# Patient Record
Sex: Female | Born: 1970 | Race: Black or African American | Hispanic: No | Marital: Single | Smoking: Never smoker
Health system: Southern US, Community
[De-identification: ages and names within clinical notes are randomized; demographics above are authoritative.]

## PROBLEM LIST (undated history)

## (undated) DIAGNOSIS — IMO0001 Reserved for inherently not codable concepts without codable children: Secondary | ICD-10-CM

## (undated) DIAGNOSIS — I639 Cerebral infarction, unspecified: Secondary | ICD-10-CM

## (undated) DIAGNOSIS — I509 Heart failure, unspecified: Secondary | ICD-10-CM

## (undated) DIAGNOSIS — I10 Essential (primary) hypertension: Secondary | ICD-10-CM

## (undated) DIAGNOSIS — R569 Unspecified convulsions: Secondary | ICD-10-CM

## (undated) DIAGNOSIS — Z5189 Encounter for other specified aftercare: Secondary | ICD-10-CM

## (undated) DIAGNOSIS — I499 Cardiac arrhythmia, unspecified: Secondary | ICD-10-CM

## (undated) HISTORY — PX: TONSILLECTOMY: SUR1361

## (undated) HISTORY — DX: Unspecified convulsions: R56.9

## (undated) HISTORY — PX: ABDOMINAL HYSTERECTOMY: SHX81

## (undated) HISTORY — PX: ORTHOPEDIC SURGERY: SHX850

---

## 2008-08-28 ENCOUNTER — Ambulatory Visit: Payer: Self-pay | Admitting: Radiology

## 2008-08-28 ENCOUNTER — Emergency Department (HOSPITAL_BASED_OUTPATIENT_CLINIC_OR_DEPARTMENT_OTHER): Admission: EM | Admit: 2008-08-28 | Discharge: 2008-08-28 | Payer: Self-pay | Admitting: Emergency Medicine

## 2008-09-10 ENCOUNTER — Emergency Department (HOSPITAL_BASED_OUTPATIENT_CLINIC_OR_DEPARTMENT_OTHER): Admission: EM | Admit: 2008-09-10 | Discharge: 2008-09-10 | Payer: Self-pay | Admitting: Emergency Medicine

## 2008-09-24 ENCOUNTER — Emergency Department (HOSPITAL_BASED_OUTPATIENT_CLINIC_OR_DEPARTMENT_OTHER): Admission: EM | Admit: 2008-09-24 | Discharge: 2008-09-25 | Payer: Self-pay | Admitting: Emergency Medicine

## 2008-10-19 ENCOUNTER — Emergency Department (HOSPITAL_BASED_OUTPATIENT_CLINIC_OR_DEPARTMENT_OTHER): Admission: EM | Admit: 2008-10-19 | Discharge: 2008-10-19 | Payer: Self-pay | Admitting: Emergency Medicine

## 2008-12-20 ENCOUNTER — Emergency Department (HOSPITAL_BASED_OUTPATIENT_CLINIC_OR_DEPARTMENT_OTHER): Admission: EM | Admit: 2008-12-20 | Discharge: 2008-12-20 | Payer: Self-pay | Admitting: Emergency Medicine

## 2009-01-30 ENCOUNTER — Emergency Department (HOSPITAL_BASED_OUTPATIENT_CLINIC_OR_DEPARTMENT_OTHER): Admission: EM | Admit: 2009-01-30 | Discharge: 2009-01-30 | Payer: Self-pay | Admitting: Emergency Medicine

## 2009-01-30 ENCOUNTER — Ambulatory Visit: Payer: Self-pay | Admitting: Radiology

## 2009-04-10 ENCOUNTER — Ambulatory Visit: Payer: Self-pay | Admitting: Diagnostic Radiology

## 2009-04-10 ENCOUNTER — Emergency Department (HOSPITAL_BASED_OUTPATIENT_CLINIC_OR_DEPARTMENT_OTHER): Admission: EM | Admit: 2009-04-10 | Discharge: 2009-04-11 | Payer: Self-pay | Admitting: Emergency Medicine

## 2009-04-21 ENCOUNTER — Emergency Department (HOSPITAL_BASED_OUTPATIENT_CLINIC_OR_DEPARTMENT_OTHER): Admission: EM | Admit: 2009-04-21 | Discharge: 2009-04-21 | Payer: Self-pay | Admitting: Emergency Medicine

## 2009-06-05 ENCOUNTER — Emergency Department (HOSPITAL_BASED_OUTPATIENT_CLINIC_OR_DEPARTMENT_OTHER): Admission: EM | Admit: 2009-06-05 | Discharge: 2009-06-05 | Payer: Self-pay | Admitting: Emergency Medicine

## 2009-06-13 ENCOUNTER — Emergency Department (HOSPITAL_BASED_OUTPATIENT_CLINIC_OR_DEPARTMENT_OTHER): Admission: EM | Admit: 2009-06-13 | Discharge: 2009-06-13 | Payer: Self-pay | Admitting: Emergency Medicine

## 2009-06-13 ENCOUNTER — Ambulatory Visit: Payer: Self-pay | Admitting: Diagnostic Radiology

## 2009-09-18 ENCOUNTER — Emergency Department (HOSPITAL_BASED_OUTPATIENT_CLINIC_OR_DEPARTMENT_OTHER): Admission: EM | Admit: 2009-09-18 | Discharge: 2009-09-18 | Payer: Self-pay | Admitting: Emergency Medicine

## 2009-09-18 ENCOUNTER — Ambulatory Visit: Payer: Self-pay | Admitting: Diagnostic Radiology

## 2009-10-19 ENCOUNTER — Emergency Department (HOSPITAL_BASED_OUTPATIENT_CLINIC_OR_DEPARTMENT_OTHER): Admission: EM | Admit: 2009-10-19 | Discharge: 2009-10-19 | Payer: Self-pay | Admitting: Emergency Medicine

## 2010-01-04 ENCOUNTER — Emergency Department: Payer: Self-pay | Admitting: Emergency Medicine

## 2010-01-16 ENCOUNTER — Emergency Department (HOSPITAL_BASED_OUTPATIENT_CLINIC_OR_DEPARTMENT_OTHER): Admission: EM | Admit: 2010-01-16 | Discharge: 2010-01-16 | Payer: Self-pay | Admitting: Emergency Medicine

## 2010-04-06 ENCOUNTER — Emergency Department (HOSPITAL_BASED_OUTPATIENT_CLINIC_OR_DEPARTMENT_OTHER)
Admission: EM | Admit: 2010-04-06 | Discharge: 2010-04-06 | Disposition: A | Discharge status: Other Acute Inpatient Hospital | Payer: Self-pay | Source: Home / Self Care | Admitting: Emergency Medicine

## 2010-04-13 LAB — DIFFERENTIAL
Basophils Absolute: 0 10*3/uL (ref 0.0–0.1)
Basophils Relative: 0 % (ref 0–1)
Eosinophils Absolute: 0.2 10*3/uL (ref 0.0–0.7)
Eosinophils Relative: 3 % (ref 0–5)
Lymphocytes Relative: 43 % (ref 12–46)
Lymphs Abs: 2.4 10*3/uL (ref 0.7–4.0)
Monocytes Absolute: 0.5 10*3/uL (ref 0.1–1.0)
Monocytes Relative: 9 % (ref 3–12)
Neutro Abs: 2.5 10*3/uL (ref 1.7–7.7)
Neutrophils Relative %: 44 % (ref 43–77)

## 2010-04-13 LAB — CBC
HCT: 34.9 % — ABNORMAL LOW (ref 36.0–46.0)
Hemoglobin: 12 g/dL (ref 12.0–15.0)
MCH: 30.7 pg (ref 26.0–34.0)
MCHC: 34.4 g/dL (ref 30.0–36.0)
MCV: 89.3 fL (ref 78.0–100.0)
Platelets: 285 10*3/uL (ref 150–400)
RBC: 3.91 MIL/uL (ref 3.87–5.11)
RDW: 13.3 % (ref 11.5–15.5)
WBC: 5.5 10*3/uL (ref 4.0–10.5)

## 2010-04-13 LAB — BASIC METABOLIC PANEL
BUN: 11 mg/dL (ref 6–23)
CO2: 24 mEq/L (ref 19–32)
Calcium: 8.8 mg/dL (ref 8.4–10.5)
Chloride: 108 mEq/L (ref 96–112)
Creatinine, Ser: 0.9 mg/dL (ref 0.4–1.2)
GFR calc Af Amer: >60 mL/min (ref >60)
GFR calc non Af Amer: >60 mL/min (ref >60)
Glucose, Bld: 80 mg/dL (ref 70–99)
Potassium: 4 mEq/L (ref 3.5–5.1)
Sodium: 142 mEq/L (ref 135–145)

## 2010-04-13 LAB — URINALYSIS, ROUTINE W REFLEX MICROSCOPIC
Bilirubin Urine: NEGATIVE
Hgb urine dipstick: NEGATIVE
Ketones, ur: NEGATIVE mg/dL
Nitrite: NEGATIVE
Protein, ur: NEGATIVE mg/dL
Specific Gravity, Urine: 1.022 (ref 1.005–1.030)
Urine Glucose, Fasting: NEGATIVE mg/dL
Urobilinogen, UA: 1 mg/dL (ref 0.0–1.0)
pH: 5.5 (ref 5.0–8.0)

## 2010-04-13 LAB — POCT CARDIAC MARKERS
CKMB, poc: <1 ng/mL — ABNORMAL LOW (ref 1.0–8.0)
Myoglobin, poc: 57.9 ng/mL (ref 12–200)
Troponin i, poc: <0.05 ng/mL (ref 0.00–0.09)

## 2010-04-13 LAB — PREGNANCY, URINE: Preg Test, Ur: NEGATIVE

## 2010-05-05 ENCOUNTER — Emergency Department (INDEPENDENT_AMBULATORY_CARE_PROVIDER_SITE_OTHER): Payer: Medicaid Other

## 2010-05-05 ENCOUNTER — Emergency Department (HOSPITAL_BASED_OUTPATIENT_CLINIC_OR_DEPARTMENT_OTHER)
Admission: EM | Admit: 2010-05-05 | Discharge: 2010-05-05 | Disposition: A | Discharge status: Home / Self Care | Payer: Medicaid Other | Attending: Emergency Medicine | Admitting: Emergency Medicine

## 2010-05-05 DIAGNOSIS — Y92009 Unspecified place in unspecified non-institutional (private) residence as the place of occurrence of the external cause: Secondary | ICD-10-CM | POA: Insufficient documentation

## 2010-05-05 DIAGNOSIS — J45909 Unspecified asthma, uncomplicated: Secondary | ICD-10-CM | POA: Insufficient documentation

## 2010-05-05 DIAGNOSIS — M25579 Pain in unspecified ankle and joints of unspecified foot: Secondary | ICD-10-CM | POA: Insufficient documentation

## 2010-05-05 DIAGNOSIS — Y93E1 Activity, personal bathing and showering: Secondary | ICD-10-CM

## 2010-05-05 DIAGNOSIS — W010XXA Fall on same level from slipping, tripping and stumbling without subsequent striking against object, initial encounter: Secondary | ICD-10-CM

## 2010-05-05 DIAGNOSIS — R61 Generalized hyperhidrosis: Secondary | ICD-10-CM | POA: Insufficient documentation

## 2010-05-05 DIAGNOSIS — S63509A Unspecified sprain of unspecified wrist, initial encounter: Secondary | ICD-10-CM | POA: Insufficient documentation

## 2010-05-05 DIAGNOSIS — S93409A Sprain of unspecified ligament of unspecified ankle, initial encounter: Secondary | ICD-10-CM | POA: Insufficient documentation

## 2010-05-05 DIAGNOSIS — M549 Dorsalgia, unspecified: Secondary | ICD-10-CM

## 2010-05-05 DIAGNOSIS — M25539 Pain in unspecified wrist: Secondary | ICD-10-CM

## 2010-05-05 DIAGNOSIS — I10 Essential (primary) hypertension: Secondary | ICD-10-CM | POA: Insufficient documentation

## 2010-06-22 LAB — URINE MICROSCOPIC-ADD ON

## 2010-06-22 LAB — URINALYSIS, ROUTINE W REFLEX MICROSCOPIC
Bilirubin Urine: NEGATIVE
Glucose, UA: NEGATIVE mg/dL
Hgb urine dipstick: NEGATIVE
Ketones, ur: NEGATIVE mg/dL
Nitrite: NEGATIVE
Protein, ur: NEGATIVE mg/dL
Specific Gravity, Urine: 1.02 (ref 1.005–1.030)
Urobilinogen, UA: 1 mg/dL (ref 0.0–1.0)
pH: 7 (ref 5.0–8.0)

## 2010-06-30 ENCOUNTER — Emergency Department (INDEPENDENT_AMBULATORY_CARE_PROVIDER_SITE_OTHER): Payer: Medicaid Other

## 2010-06-30 ENCOUNTER — Emergency Department (HOSPITAL_BASED_OUTPATIENT_CLINIC_OR_DEPARTMENT_OTHER)
Admission: EM | Admit: 2010-06-30 | Discharge: 2010-06-30 | Disposition: A | Discharge status: Home / Self Care | Payer: Medicaid Other | Attending: Emergency Medicine | Admitting: Emergency Medicine

## 2010-06-30 DIAGNOSIS — I10 Essential (primary) hypertension: Secondary | ICD-10-CM | POA: Insufficient documentation

## 2010-06-30 DIAGNOSIS — R071 Chest pain on breathing: Secondary | ICD-10-CM | POA: Insufficient documentation

## 2010-06-30 DIAGNOSIS — Z79899 Other long term (current) drug therapy: Secondary | ICD-10-CM | POA: Insufficient documentation

## 2010-06-30 DIAGNOSIS — R079 Chest pain, unspecified: Secondary | ICD-10-CM | POA: Insufficient documentation

## 2010-06-30 DIAGNOSIS — R0602 Shortness of breath: Secondary | ICD-10-CM

## 2010-06-30 DIAGNOSIS — J45909 Unspecified asthma, uncomplicated: Secondary | ICD-10-CM | POA: Insufficient documentation

## 2010-06-30 DIAGNOSIS — G43909 Migraine, unspecified, not intractable, without status migrainosus: Secondary | ICD-10-CM | POA: Insufficient documentation

## 2010-06-30 DIAGNOSIS — R55 Syncope and collapse: Secondary | ICD-10-CM | POA: Insufficient documentation

## 2010-06-30 LAB — DIFFERENTIAL
Basophils Absolute: 0 10*3/uL (ref 0.0–0.1)
Basophils Relative: 0 % (ref 0–1)
Eosinophils Absolute: 0.1 10*3/uL (ref 0.0–0.7)
Eosinophils Relative: 3 % (ref 0–5)
Lymphocytes Relative: 39 % (ref 12–46)
Lymphs Abs: 1.9 10*3/uL (ref 0.7–4.0)
Monocytes Absolute: 0.3 10*3/uL (ref 0.1–1.0)
Monocytes Relative: 6 % (ref 3–12)
Neutro Abs: 2.5 10*3/uL (ref 1.7–7.7)
Neutrophils Relative %: 52 % (ref 43–77)

## 2010-06-30 LAB — URINALYSIS, ROUTINE W REFLEX MICROSCOPIC
Bilirubin Urine: NEGATIVE
Glucose, UA: NEGATIVE mg/dL
Hgb urine dipstick: NEGATIVE
Ketones, ur: NEGATIVE mg/dL
Nitrite: NEGATIVE
Protein, ur: NEGATIVE mg/dL
Specific Gravity, Urine: 1.021 (ref 1.005–1.030)
Urobilinogen, UA: 1 mg/dL (ref 0.0–1.0)
pH: 6 (ref 5.0–8.0)

## 2010-06-30 LAB — CBC
HCT: 36.3 % (ref 36.0–46.0)
Hemoglobin: 12.5 g/dL (ref 12.0–15.0)
MCH: 31.5 pg (ref 26.0–34.0)
MCHC: 34.4 g/dL (ref 30.0–36.0)
MCV: 91.4 fL (ref 78.0–100.0)
Platelets: 308 10*3/uL (ref 150–400)
RBC: 3.97 MIL/uL (ref 3.87–5.11)
RDW: 14.1 % (ref 11.5–15.5)
WBC: 4.9 10*3/uL (ref 4.0–10.5)

## 2010-06-30 LAB — BASIC METABOLIC PANEL
BUN: 14 mg/dL (ref 6–23)
CO2: 23 mEq/L (ref 19–32)
Calcium: 8.8 mg/dL (ref 8.4–10.5)
Chloride: 109 mEq/L (ref 96–112)
Creatinine, Ser: 0.8 mg/dL (ref 0.4–1.2)
GFR calc Af Amer: >60 mL/min (ref >60)
GFR calc non Af Amer: >60 mL/min (ref >60)
Glucose, Bld: 113 mg/dL — ABNORMAL HIGH (ref 70–99)
Potassium: 4.1 mEq/L (ref 3.5–5.1)
Sodium: 145 mEq/L (ref 135–145)

## 2010-06-30 LAB — POCT CARDIAC MARKERS
CKMB, poc: <1 ng/mL — ABNORMAL LOW (ref 1.0–8.0)
Myoglobin, poc: 39.1 ng/mL (ref 12–200)
Troponin i, poc: <0.05 ng/mL (ref 0.00–0.09)

## 2010-06-30 LAB — URINE MICROSCOPIC-ADD ON

## 2010-07-06 ENCOUNTER — Emergency Department (HOSPITAL_BASED_OUTPATIENT_CLINIC_OR_DEPARTMENT_OTHER)
Admission: EM | Admit: 2010-07-06 | Discharge: 2010-07-06 | Disposition: A | Discharge status: Home / Self Care | Payer: Medicaid Other | Attending: Emergency Medicine | Admitting: Emergency Medicine

## 2010-07-06 DIAGNOSIS — M549 Dorsalgia, unspecified: Secondary | ICD-10-CM | POA: Insufficient documentation

## 2010-07-06 DIAGNOSIS — J45909 Unspecified asthma, uncomplicated: Secondary | ICD-10-CM | POA: Insufficient documentation

## 2010-07-06 DIAGNOSIS — I10 Essential (primary) hypertension: Secondary | ICD-10-CM | POA: Insufficient documentation

## 2010-07-06 LAB — URINALYSIS, ROUTINE W REFLEX MICROSCOPIC
Glucose, UA: NEGATIVE mg/dL
Ketones, ur: NEGATIVE mg/dL
Protein, ur: NEGATIVE mg/dL

## 2010-07-06 LAB — URINE MICROSCOPIC-ADD ON

## 2010-07-29 ENCOUNTER — Emergency Department (INDEPENDENT_AMBULATORY_CARE_PROVIDER_SITE_OTHER): Payer: Medicaid Other

## 2010-07-29 ENCOUNTER — Emergency Department (HOSPITAL_BASED_OUTPATIENT_CLINIC_OR_DEPARTMENT_OTHER)
Admission: EM | Admit: 2010-07-29 | Discharge: 2010-07-29 | Disposition: A | Discharge status: Home / Self Care | Payer: Medicaid Other | Attending: Emergency Medicine | Admitting: Emergency Medicine

## 2010-07-29 DIAGNOSIS — J45909 Unspecified asthma, uncomplicated: Secondary | ICD-10-CM | POA: Insufficient documentation

## 2010-07-29 DIAGNOSIS — R51 Headache: Secondary | ICD-10-CM | POA: Insufficient documentation

## 2010-07-29 DIAGNOSIS — I10 Essential (primary) hypertension: Secondary | ICD-10-CM | POA: Insufficient documentation

## 2010-07-29 DIAGNOSIS — M549 Dorsalgia, unspecified: Secondary | ICD-10-CM

## 2010-07-29 LAB — COMPREHENSIVE METABOLIC PANEL
ALT: 17 U/L (ref 0–35)
Alkaline Phosphatase: 72 U/L (ref 39–117)
CO2: 22 mEq/L (ref 19–32)
Chloride: 105 mEq/L (ref 96–112)
GFR calc non Af Amer: >60 mL/min (ref >60)
Glucose, Bld: 94 mg/dL (ref 70–99)
Potassium: 3.7 mEq/L (ref 3.5–5.1)
Sodium: 137 mEq/L (ref 135–145)

## 2010-07-29 LAB — DIFFERENTIAL
Basophils Absolute: 0 10*3/uL (ref 0.0–0.1)
Lymphocytes Relative: 27 % (ref 12–46)
Monocytes Absolute: 0.5 10*3/uL (ref 0.1–1.0)
Neutro Abs: 2.2 10*3/uL (ref 1.7–7.7)

## 2010-07-29 LAB — CBC
HCT: 33.4 % — ABNORMAL LOW (ref 36.0–46.0)
Hemoglobin: 11.4 g/dL — ABNORMAL LOW (ref 12.0–15.0)
MCHC: 34.1 g/dL (ref 30.0–36.0)
WBC: 3.8 10*3/uL — ABNORMAL LOW (ref 4.0–10.5)

## 2010-07-29 LAB — URINALYSIS, ROUTINE W REFLEX MICROSCOPIC
Ketones, ur: NEGATIVE mg/dL
Nitrite: NEGATIVE
Protein, ur: NEGATIVE mg/dL

## 2010-08-31 ENCOUNTER — Emergency Department (HOSPITAL_BASED_OUTPATIENT_CLINIC_OR_DEPARTMENT_OTHER)
Admission: EM | Admit: 2010-08-31 | Discharge: 2010-08-31 | Disposition: A | Discharge status: Home / Self Care | Payer: Medicaid Other | Attending: Emergency Medicine | Admitting: Emergency Medicine

## 2010-08-31 DIAGNOSIS — J45909 Unspecified asthma, uncomplicated: Secondary | ICD-10-CM | POA: Insufficient documentation

## 2010-08-31 DIAGNOSIS — I10 Essential (primary) hypertension: Secondary | ICD-10-CM | POA: Insufficient documentation

## 2010-08-31 DIAGNOSIS — R002 Palpitations: Secondary | ICD-10-CM | POA: Insufficient documentation

## 2010-08-31 DIAGNOSIS — G43909 Migraine, unspecified, not intractable, without status migrainosus: Secondary | ICD-10-CM | POA: Insufficient documentation

## 2010-09-23 ENCOUNTER — Emergency Department (HOSPITAL_BASED_OUTPATIENT_CLINIC_OR_DEPARTMENT_OTHER)
Admission: EM | Admit: 2010-09-23 | Discharge: 2010-09-23 | Disposition: A | Discharge status: Home / Self Care | Payer: Medicaid Other | Attending: Emergency Medicine | Admitting: Emergency Medicine

## 2010-09-23 DIAGNOSIS — I10 Essential (primary) hypertension: Secondary | ICD-10-CM | POA: Insufficient documentation

## 2010-09-23 DIAGNOSIS — R51 Headache: Secondary | ICD-10-CM | POA: Insufficient documentation

## 2010-09-23 DIAGNOSIS — J45909 Unspecified asthma, uncomplicated: Secondary | ICD-10-CM | POA: Insufficient documentation

## 2010-12-13 ENCOUNTER — Encounter: Payer: Self-pay | Admitting: *Deleted

## 2010-12-13 ENCOUNTER — Emergency Department (HOSPITAL_BASED_OUTPATIENT_CLINIC_OR_DEPARTMENT_OTHER)
Admission: EM | Admit: 2010-12-13 | Discharge: 2010-12-14 | Disposition: A | Discharge status: Home / Self Care | Payer: Medicaid Other | Attending: Emergency Medicine | Admitting: Emergency Medicine

## 2010-12-13 ENCOUNTER — Other Ambulatory Visit: Payer: Self-pay

## 2010-12-13 DIAGNOSIS — R55 Syncope and collapse: Secondary | ICD-10-CM | POA: Insufficient documentation

## 2010-12-13 DIAGNOSIS — R112 Nausea with vomiting, unspecified: Secondary | ICD-10-CM | POA: Insufficient documentation

## 2010-12-13 DIAGNOSIS — R51 Headache: Secondary | ICD-10-CM

## 2010-12-13 DIAGNOSIS — J45909 Unspecified asthma, uncomplicated: Secondary | ICD-10-CM | POA: Insufficient documentation

## 2010-12-13 DIAGNOSIS — I10 Essential (primary) hypertension: Secondary | ICD-10-CM | POA: Insufficient documentation

## 2010-12-13 DIAGNOSIS — R079 Chest pain, unspecified: Secondary | ICD-10-CM

## 2010-12-13 DIAGNOSIS — R002 Palpitations: Secondary | ICD-10-CM | POA: Insufficient documentation

## 2010-12-13 DIAGNOSIS — I509 Heart failure, unspecified: Secondary | ICD-10-CM | POA: Insufficient documentation

## 2010-12-13 DIAGNOSIS — R202 Paresthesia of skin: Secondary | ICD-10-CM

## 2010-12-13 HISTORY — DX: Reserved for inherently not codable concepts without codable children: IMO0001

## 2010-12-13 HISTORY — DX: Essential (primary) hypertension: I10

## 2010-12-13 HISTORY — DX: Heart failure, unspecified: I50.9

## 2010-12-13 HISTORY — DX: Encounter for other specified aftercare: Z51.89

## 2010-12-13 HISTORY — DX: Cardiac arrhythmia, unspecified: I49.9

## 2010-12-13 NOTE — ED Notes (Signed)
Pt reports intermittant chest pain- right side x 2 hours, shooting down right arm- states "i was seeing 2 of everything" and "i may have had a seizure- i know i passed out"- states "vision was funny"- pt wearing heart monitor x 3 weeks due to irregular heart beat

## 2010-12-13 NOTE — ED Notes (Signed)
EDP Otter at bedside to assess

## 2010-12-13 NOTE — ED Notes (Signed)
Pt reports she has been wearing heart monitor x 3 weeks for arrhythmia- states tonight found herself on floor in bathroom- states her vision got funny "i was seeing double" and she was "feeling funny"- pt reported right side chest pain radiating into right arm- nauseated at this time- ekg done on arrival and given to EDP Otter to review

## 2010-12-14 ENCOUNTER — Emergency Department (INDEPENDENT_AMBULATORY_CARE_PROVIDER_SITE_OTHER): Payer: Medicaid Other

## 2010-12-14 ENCOUNTER — Encounter (HOSPITAL_BASED_OUTPATIENT_CLINIC_OR_DEPARTMENT_OTHER): Payer: Self-pay | Admitting: *Deleted

## 2010-12-14 DIAGNOSIS — R42 Dizziness and giddiness: Secondary | ICD-10-CM

## 2010-12-14 DIAGNOSIS — R55 Syncope and collapse: Secondary | ICD-10-CM

## 2010-12-14 DIAGNOSIS — R079 Chest pain, unspecified: Secondary | ICD-10-CM

## 2010-12-14 LAB — CARDIAC PANEL(CRET KIN+CKTOT+MB+TROPI)
CK, MB: 3.4 ng/mL (ref 0.3–4.0)
CK, MB: 3.3 ng/mL (ref 0.3–4.0)
Total CK: 288 U/L — ABNORMAL HIGH (ref 7–177)
Total CK: 261 U/L — ABNORMAL HIGH (ref 7–177)
Troponin I: <0.3 ng/mL (ref <0.30)

## 2010-12-14 LAB — COMPREHENSIVE METABOLIC PANEL
Alkaline Phosphatase: 77 U/L (ref 39–117)
BUN: 10 mg/dL (ref 6–23)
CO2: 26 mEq/L (ref 19–32)
GFR calc Af Amer: >60 mL/min (ref >60)
GFR calc non Af Amer: >60 mL/min (ref >60)
Glucose, Bld: 86 mg/dL (ref 70–99)
Potassium: 3.6 mEq/L (ref 3.5–5.1)
Total Protein: 7.6 g/dL (ref 6.0–8.3)

## 2010-12-14 LAB — MAGNESIUM: Magnesium: 2.1 mg/dL (ref 1.5–2.5)

## 2010-12-14 LAB — CBC
HCT: 36.9 % (ref 36.0–46.0)
MCH: 31.3 pg (ref 26.0–34.0)
MCHC: 34.1 g/dL (ref 30.0–36.0)
RDW: 13 % (ref 11.5–15.5)

## 2010-12-14 MED ORDER — ASPIRIN 81 MG PO CHEW
324.0000 mg | CHEWABLE_TABLET | Freq: Once | ORAL | Status: AC
  Administered 2010-12-14: 324 mg via ORAL
  Filled 2010-12-14: qty 4

## 2010-12-14 MED ORDER — DIPHENHYDRAMINE HCL 50 MG/ML IJ SOLN
25.0000 mg | Freq: Once | INTRAMUSCULAR | Status: AC
  Administered 2010-12-14: 25 mg via INTRAVENOUS
  Filled 2010-12-14: qty 1

## 2010-12-14 MED ORDER — ONDANSETRON HCL 4 MG/2ML IJ SOLN
4.0000 mg | Freq: Once | INTRAMUSCULAR | Status: AC
  Administered 2010-12-14: 4 mg via INTRAVENOUS
  Filled 2010-12-14: qty 2

## 2010-12-14 MED ORDER — SODIUM CHLORIDE 0.9 % IJ SOLN
3.0000 mL | Freq: Two times a day (BID) | INTRAMUSCULAR | Status: DC
  Filled 2010-12-14: qty 3

## 2010-12-14 MED ORDER — MORPHINE SULFATE 4 MG/ML IJ SOLN
4.0000 mg | Freq: Once | INTRAMUSCULAR | Status: AC
  Administered 2010-12-14: 4 mg via INTRAVENOUS
  Filled 2010-12-14: qty 1

## 2010-12-14 MED ORDER — ONDANSETRON HCL 4 MG PO TABS: 4.0000 mg | ORAL_TABLET | Freq: Three times a day (TID) | ORAL | Status: AC | PRN

## 2010-12-14 MED ORDER — METOCLOPRAMIDE HCL 5 MG/ML IJ SOLN
10.0000 mg | Freq: Once | INTRAMUSCULAR | Status: AC
  Administered 2010-12-14: 10 mg via INTRAVENOUS
  Filled 2010-12-14: qty 2

## 2010-12-14 MED ORDER — SODIUM CHLORIDE 0.9 % IV SOLN
250.0000 mL | INTRAVENOUS | Status: DC
  Administered 2010-12-14: 250 mL via INTRAVENOUS

## 2010-12-14 MED ORDER — SODIUM CHLORIDE 0.9 % IJ SOLN
3.0000 mL | INTRAMUSCULAR | Status: DC | PRN
  Filled 2010-12-14: qty 3

## 2010-12-14 NOTE — ED Notes (Signed)
Report to Delorise Jackson, RN

## 2010-12-14 NOTE — ED Provider Notes (Addendum)
History     CSN: [id number] Arrival date & time: 12/13/2010 11:23 PM   Chief Complaint  Patient presents with  . Chest Pain    HPI 40 year old female presents to emergency department with multiple complaints. Patient reports she was in good health today but then started having pain in her right chest going down into her right arm with tingling and "funny feeling" patient with headache as well with nausea and vomiting upon arrival here. Patient reports she's been having palpitations. Patient said that when symptoms started she was lying in the bed and her vision went blurry then double and triple vision. She got up to the bathroom and thinks she may have passed out she is unsure. Patient reports similar symptoms in the past and has been admitted to [facility]. Patient is currently on Holter monitor for irregular heartbeats. Patient reports that she doesn't like to go to [facility] ER due to "their bad attitude"   Past Medical History  Diagnosis Date  . Asthma   . Blood transfusion   . CHF (congestive heart failure)   . Hypertension   . Cardiac arrhythmia   . Migraine      Past Surgical History  Procedure Date  . Abdominal hysterectomy   . Tonsillectomy     History reviewed. No pertinent family history.  History  Substance Use Topics  . Smoking status: Never Smoker   . Smokeless tobacco: Not on file  . Alcohol Use: No    OB History    Grav Para Term Preterm Abortions TAB SAB Ect Mult Living                  Review of Systems  All other systems reviewed and are negative.    Allergies  Review of patient's allergies indicates no known allergies.  Home Medications   Current Outpatient Rx  Name Route Sig Dispense Refill  . TOPAMAX PO Oral Take by mouth.      . ONDANSETRON HCL 4 MG PO TABS Oral Take 1 tablet (4 mg total) by mouth every 8 (eight) hours as needed for nausea. 12 tablet 0    Physical Exam    BP 118/67  Pulse 73  Temp(Src) 97.9  F (36.6 C) (Oral)  Resp 16  SpO2 98%  Physical Exam  Constitutional: She is oriented to person, place, and time. She appears well-developed and well-nourished. She appears distressed.  HENT:  Head: Normocephalic and atraumatic.  Right Ear: External ear normal.  Left Ear: External ear normal.  Eyes: Conjunctivae and EOM are normal. Pupils are equal, round, and reactive to light. Right eye exhibits no discharge. Left eye exhibits no discharge. No scleral icterus.  Neck: Normal range of motion. Neck supple. No JVD present. No tracheal deviation present. No thyromegaly present.  Cardiovascular: Normal rate, regular rhythm, normal heart sounds and intact distal pulses.  Exam reveals no gallop and no friction rub.   No murmur heard. Pulmonary/Chest: Effort normal and breath sounds normal. No stridor. No respiratory distress. She has no wheezes. She has no rales.  Abdominal: Soft. Bowel sounds are normal. She exhibits no distension and no mass. There is no tenderness. There is no rebound and no guarding.  Musculoskeletal: Normal range of motion. She exhibits no edema and no tenderness.  Lymphadenopathy:    She has no cervical adenopathy.  Neurological: She is alert and oriented to person, place, and time. She has normal reflexes. She displays normal reflexes. No cranial nerve deficit.  She exhibits normal muscle tone. Coordination normal.       Pt reports pain in right arm, paresthesias  Skin: Skin is warm and dry. No rash noted. No erythema. No pallor.    ED Course  Procedures  Results for orders placed during the hospital encounter of 12/13/10  CBC      Component Value Range   WBC 6.0  4.0 - 10.5 (K/uL)   RBC 4.03  3.87 - 5.11 (MIL/uL)   Hemoglobin 12.6  12.0 - 15.0 (g/dL)   HCT 62.9  52.8 - 41.3 (%)   MCV 91.6  78.0 - 100.0 (fL)   MCH 31.3  26.0 - 34.0 (pg)   MCHC 34.1  30.0 - 36.0 (g/dL)   RDW 24.4  01.0 - 27.2 (%)   Platelets 323  150 - 400 (K/uL)  CARDIAC PANEL(CRET  KIN+CKTOT+MB+TROPI)      Component Value Range   Total CK 288 (*) 7 - 177 (U/L)   CK, MB 3.4  0.3 - 4.0 (ng/mL)   Troponin I <0.30  <0.30 (ng/mL)   Relative Index 1.2  0.0 - 2.5   COMPREHENSIVE METABOLIC PANEL      Component Value Range   Sodium 140  135 - 145 (mEq/L)   Potassium 3.6  3.5 - 5.1 (mEq/L)   Chloride 103  96 - 112 (mEq/L)   CO2 26  19 - 32 (mEq/L)   Glucose, Bld 86  70 - 99 (mg/dL)   BUN 10  6 - 23 (mg/dL)   Creatinine, Ser 5.36  0.50 - 1.10 (mg/dL)   Calcium 9.6  8.4 - 64.4 (mg/dL)   Total Protein 7.6  6.0 - 8.3 (g/dL)   Albumin 4.1  3.5 - 5.2 (g/dL)   AST 18  0 - 37 (U/L)   ALT 17  0 - 35 (U/L)   Alkaline Phosphatase 77  39 - 117 (U/L)   Total Bilirubin 0.7  0.3 - 1.2 (mg/dL)   GFR calc non Af Amer >60  >60 (mL/min)   GFR calc Af Amer >60  >60 (mL/min)  MAGNESIUM      Component Value Range   Magnesium 2.1  1.5 - 2.5 (mg/dL)  PROTIME-INR      Component Value Range   Prothrombin Time 13.1  11.6 - 15.2 (seconds)   INR 0.97  0.00 - 1.49   PRO B NATRIURETIC PEPTIDE      Component Value Range   BNP, POC 41.5  0 - 125 (pg/mL)  CARDIAC PANEL(CRET KIN+CKTOT+MB+TROPI)      Component Value Range   Total CK 261 (*) 7 - 177 (U/L)   CK, MB 3.3  0.3 - 4.0 (ng/mL)   Troponin I <0.30  <0.30 (ng/mL)   Relative Index 1.3  0.0 - 2.5    Ct Head Wo Contrast  12/14/2010  *RADIOLOGY REPORT*  Clinical Data: 40 year old female with syncope and dizziness.  CT HEAD WITHOUT CONTRAST  Technique:  Contiguous axial images were obtained from the base of the skull through the vertex without contrast.  Comparison: 06/13/2009  Findings: No intracranial abnormalities are identified, including mass lesion or mass effect, hydrocephalus, extra-axial fluid collection, midline shift, hemorrhage, or acute infarction.  The visualized bony calvarium is unremarkable.  IMPRESSION: Unremarkable noncontrast head CT  Original Report Authenticated By: Rosendo Gros, M.D.   Dg Chest Portable 1  View  12/14/2010  *RADIOLOGY REPORT*  Clinical Data: Chest pain and syncope.  PORTABLE CHEST - 1 VIEW  Comparison: 07/29/2010  Findings: The cardiomediastinal silhouette is unremarkable. The lungs are clear. There is no evidence of focal airspace disease, pulmonary edema, pulmonary nodule/mass, pleural effusion, or pneumothorax. No acute bony abnormalities are identified.  IMPRESSION: No evidence of active cardiopulmonary disease.  Original Report Authenticated By: Rosendo Gros, M.D.    Date: 12/14/2010  Rate: 76  Rhythm: normal sinus rhythm  QRS Axis: normal  Intervals: normal  ST/T Wave abnormalities: normal and nonspecific T wave changes  Conduction Disutrbances:none  Narrative Interpretation: LVH  Old EKG Reviewed: unchanged     1. Headache   2. Paresthesia of right arm   3. Chest pain, unspecified   4. Palpitation   5. Syncope and collapse      MDM 40 year old female with multiple complaints. Differential included MI, stroke, complex migraine, musculoskeletal pain, vertebral artery dissection, subarachnoid bleed. EKG unchanged from prior in June. Patient did have episode of tachycardia, however this was during an episode of vomiting. No further vomiting in the department. Patient noted to be asking for narcotics for pain control and was given morphine for same. Prior records reviewed. Patient with multiple visits to the emergency department for headache, nausea vomiting, syncope, chest pain records received from [facility] where she had an ED visit on 913 with similar complaints treated for migraine. CT scan of head without acute injury or abnormality. Chest x-ray without signs of congestive heart failure, BNP normal no further syncope or seizure activity here patient monitored for approximately 4 hours without irregular heartbeat or further chest pain. Patient currently wearing a Holter monitor and has followup with cardiologist. 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 Patient reports she lost her neurology followup due to missing several appointments. Recommended that she followup with another local neurologist for treatment of her headaches.       Olivia Mackie, MD 12/14/10 8119  Olivia Mackie, MD 01/28/11 [phone]

## 2011-01-22 ENCOUNTER — Emergency Department (HOSPITAL_BASED_OUTPATIENT_CLINIC_OR_DEPARTMENT_OTHER)
Admission: EM | Admit: 2011-01-22 | Discharge: 2011-01-22 | Disposition: A | Discharge status: Home / Self Care | Payer: Medicaid Other | Attending: Emergency Medicine | Admitting: Emergency Medicine

## 2011-01-22 DIAGNOSIS — I509 Heart failure, unspecified: Secondary | ICD-10-CM | POA: Insufficient documentation

## 2011-01-22 DIAGNOSIS — M25579 Pain in unspecified ankle and joints of unspecified foot: Secondary | ICD-10-CM | POA: Insufficient documentation

## 2011-01-22 DIAGNOSIS — J45909 Unspecified asthma, uncomplicated: Secondary | ICD-10-CM | POA: Insufficient documentation

## 2011-01-22 DIAGNOSIS — L089 Local infection of the skin and subcutaneous tissue, unspecified: Secondary | ICD-10-CM | POA: Insufficient documentation

## 2011-01-22 DIAGNOSIS — I10 Essential (primary) hypertension: Secondary | ICD-10-CM | POA: Insufficient documentation

## 2011-01-22 MED ORDER — CEPHALEXIN 250 MG PO CAPS
500.0000 mg | ORAL_CAPSULE | Freq: Once | ORAL | Status: AC
  Administered 2011-01-22: 500 mg via ORAL
  Filled 2011-01-22: qty 2

## 2011-01-22 MED ORDER — DOXYCYCLINE HYCLATE 100 MG PO TABS
100.0000 mg | ORAL_TABLET | Freq: Once | ORAL | Status: AC
  Administered 2011-01-22: 100 mg via ORAL
  Filled 2011-01-22: qty 1

## 2011-01-22 MED ORDER — OXYCODONE-ACETAMINOPHEN 5-325 MG PO TABS
2.0000 | ORAL_TABLET | Freq: Once | ORAL | Status: AC
  Administered 2011-01-22: 2 via ORAL
  Filled 2011-01-22: qty 2

## 2011-01-22 MED ORDER — DOXYCYCLINE HYCLATE 100 MG PO CAPS: 100.0000 mg | ORAL_CAPSULE | Freq: Two times a day (BID) | ORAL | Status: AC

## 2011-01-22 MED ORDER — OXYCODONE-ACETAMINOPHEN 5-325 MG PO TABS: 2.0000 | ORAL_TABLET | ORAL | Status: AC | PRN

## 2011-01-22 MED ORDER — ALBUTEROL SULFATE HFA 108 (90 BASE) MCG/ACT IN AERS: 1.0000 | INHALATION_SPRAY | Freq: Four times a day (QID) | RESPIRATORY_TRACT | Status: DC | PRN

## 2011-01-22 MED ORDER — CEPHALEXIN 500 MG PO CAPS: 500.0000 mg | ORAL_CAPSULE | Freq: Four times a day (QID) | ORAL | Status: AC
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
## 2011-01-23 NOTE — ED Provider Notes (Signed)
Medical screening examination/treatment/procedure(s) were performed by non-physician practitioner and as supervising physician I was immediately available for consultation/collaboration.   Nelia Shi, MD 01/23/11 0100

## 2011-01-26 DIAGNOSIS — L02619 Cutaneous abscess of unspecified foot: Secondary | ICD-10-CM | POA: Insufficient documentation

## 2011-01-26 DIAGNOSIS — T300 Burn of unspecified body region, unspecified degree: Secondary | ICD-10-CM | POA: Insufficient documentation

## 2011-04-28 DIAGNOSIS — J45901 Unspecified asthma with (acute) exacerbation: Secondary | ICD-10-CM | POA: Insufficient documentation

## 2011-04-28 DIAGNOSIS — J45909 Unspecified asthma, uncomplicated: Secondary | ICD-10-CM | POA: Insufficient documentation

## 2011-04-28 DIAGNOSIS — R519 Headache, unspecified: Secondary | ICD-10-CM | POA: Insufficient documentation

## 2011-04-28 DIAGNOSIS — G4700 Insomnia, unspecified: Secondary | ICD-10-CM | POA: Insufficient documentation

## 2011-11-05 IMAGING — CT CT HEAD W/O CM
1 series · 16 of 30 positions shown, 20 images · non-contrast
Comparison: 06/13/2009

CLINICAL DATA: 40-year-old female with syncope and dizziness.

CT HEAD WITHOUT CONTRAST
TECHNIQUE: Contiguous axial images were obtained from the base of
the skull through the vertex without contrast.

[Series 2: head 4.8 h37s · axial · 0.45mm/px · z∈[-156,-23]mm · 16 of 32 slices shown, 20 images]
[im 2/32  brain]
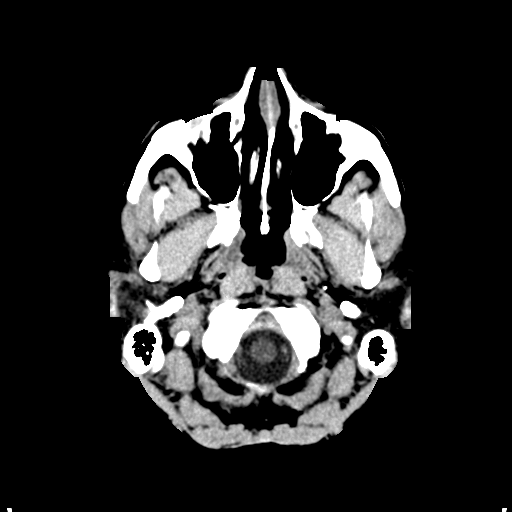
[im 2/32  bone]
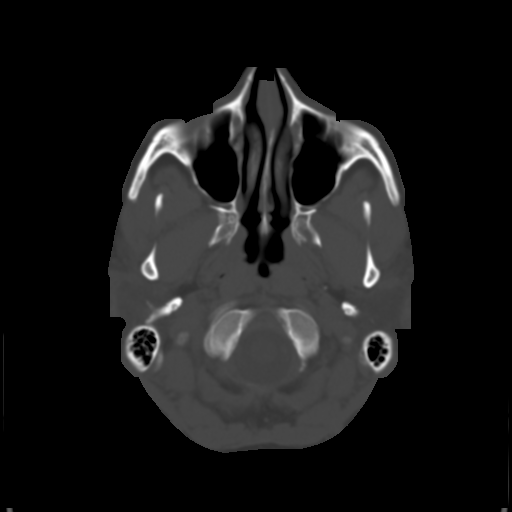
[im 4/32  brain]
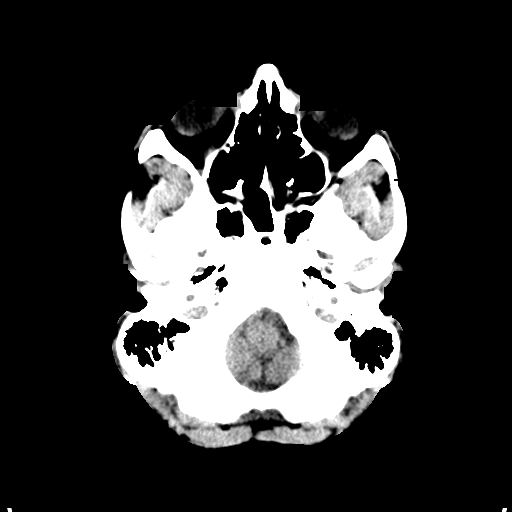
[im 6/32  brain]
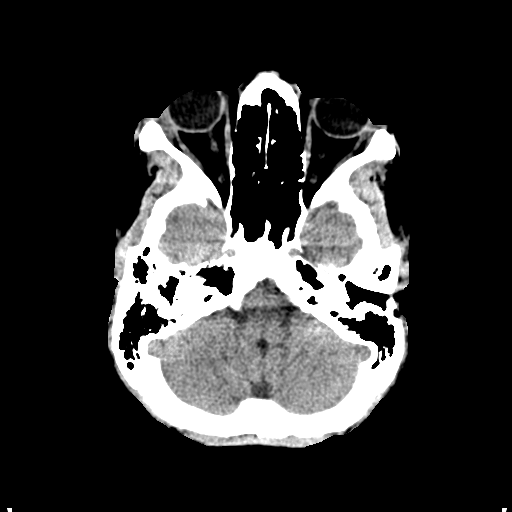
[im 8/32  brain]
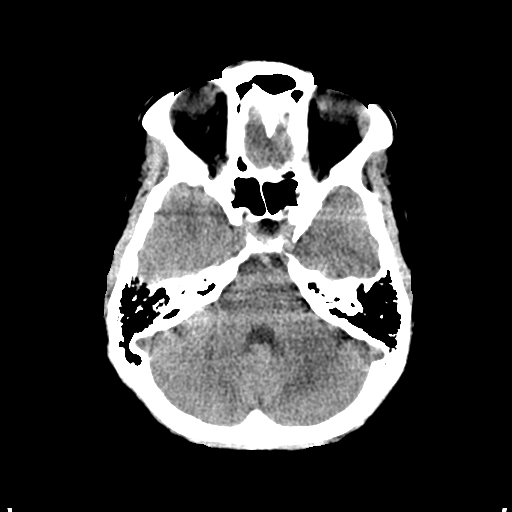
[im 9/32  brain]
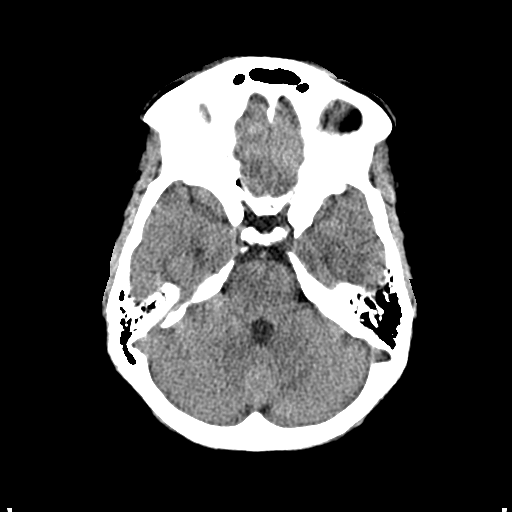
[im 9/32  bone]
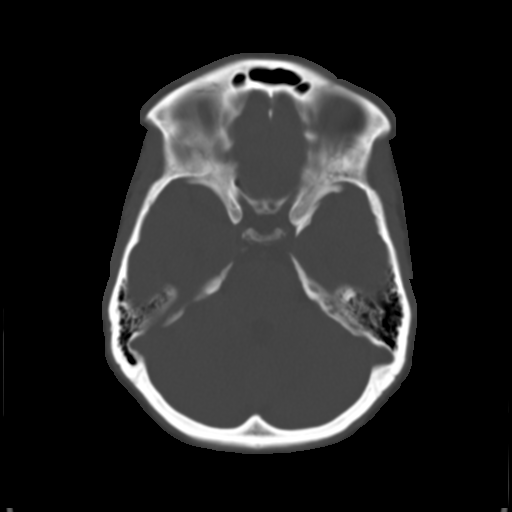
[im 11/32  brain]
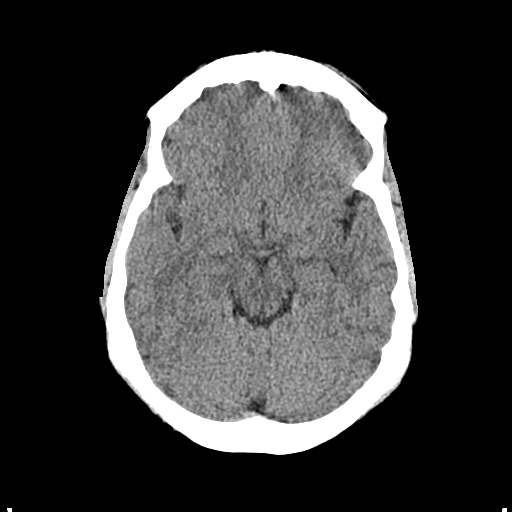
[im 13/32  brain]
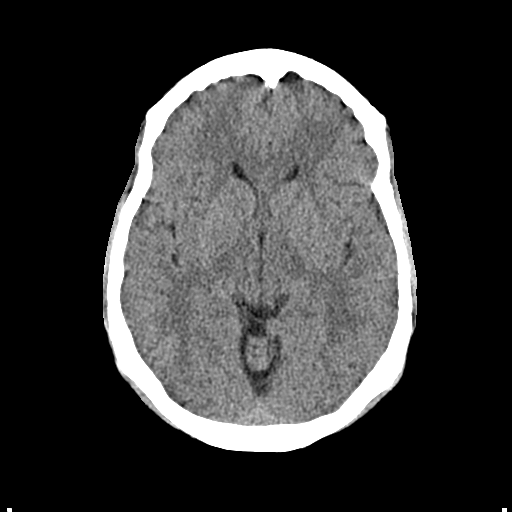
[im 15/32  brain]
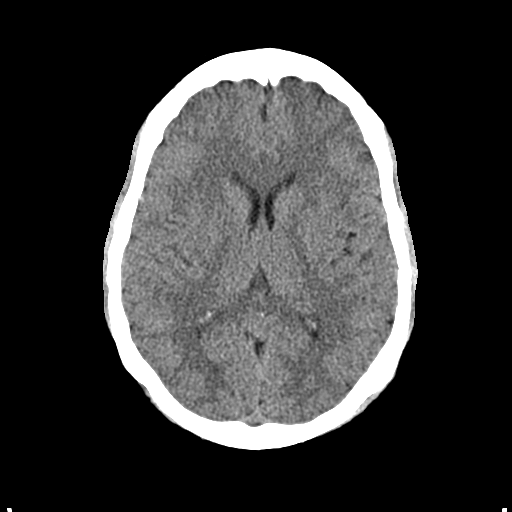
[im 17/32  brain]
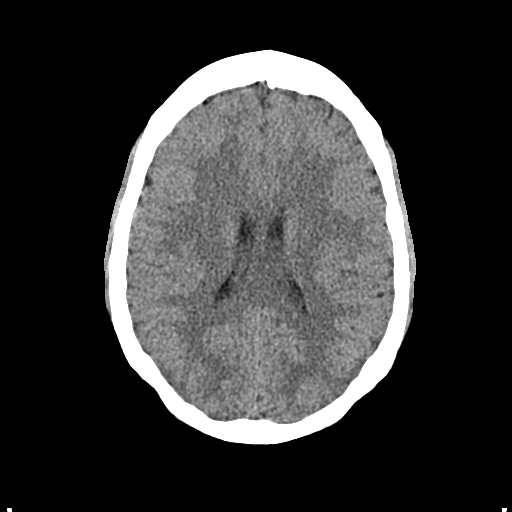
[im 17/32  bone]
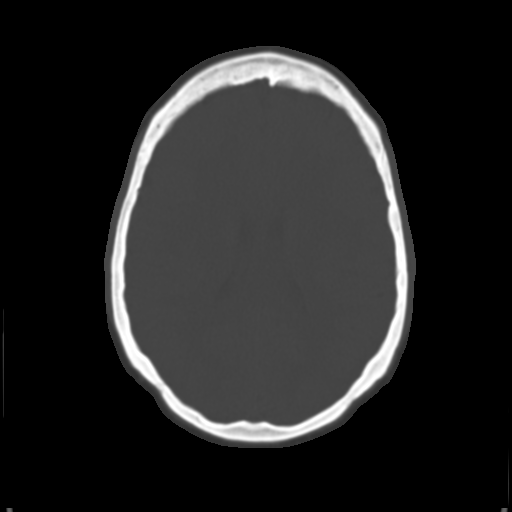
[im 19/32  brain]
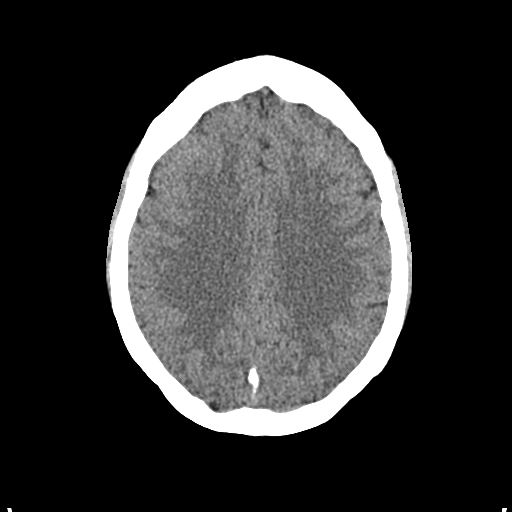
[im 21/32  brain]
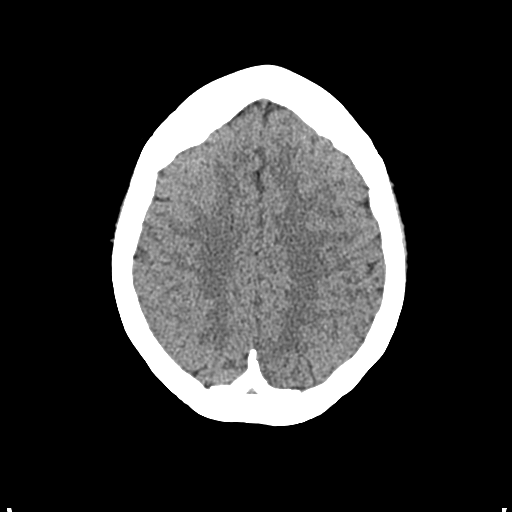
[im 23/32  brain]
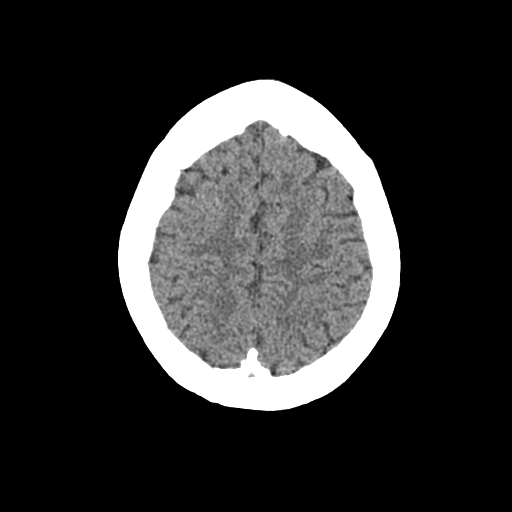
[im 24/32  brain]
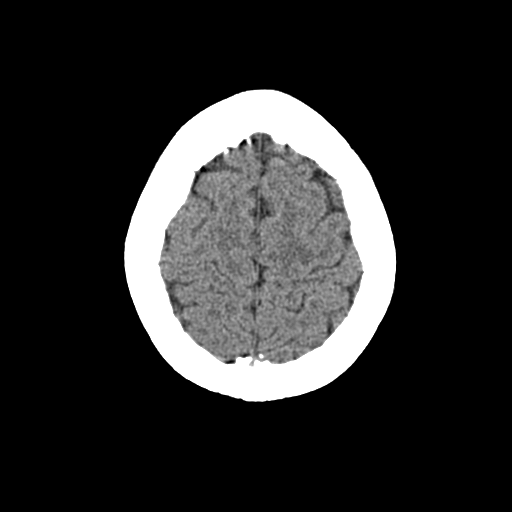
[im 24/32  bone]
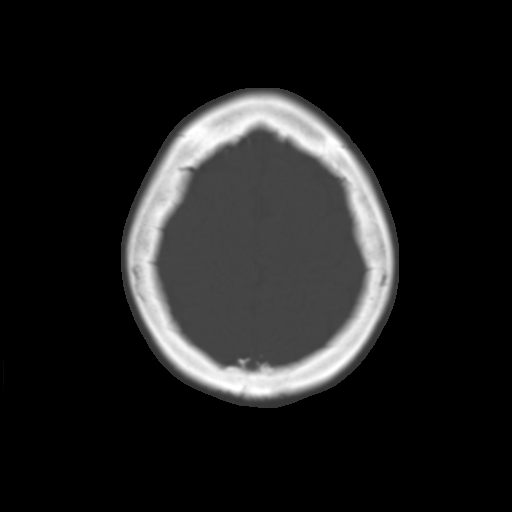
[im 26/32  brain]
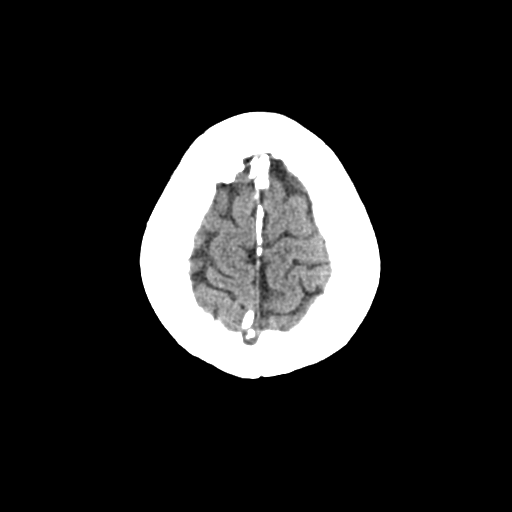
[im 28/32  brain]
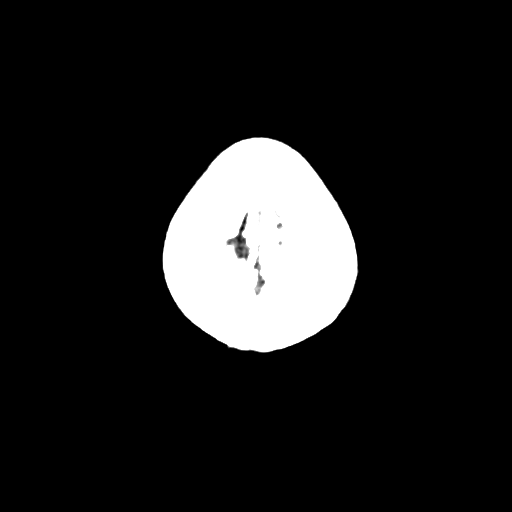
[im 30/32  brain]
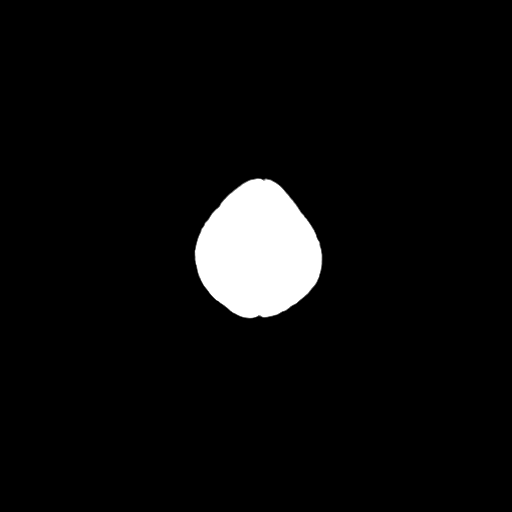

[16 of 30 positions shown; findings below may reference images not displayed]

FINDINGS: No intracranial abnormalities are identified, including
mass lesion or mass effect, hydrocephalus, extra-axial fluid
collection, midline shift, hemorrhage, or acute infarction.

The visualized bony calvarium is unremarkable.
IMPRESSION: Unremarkable noncontrast head CT

## 2011-11-05 IMAGING — CR DG CHEST 1V PORT
1 series · 1 of 1 positions shown · non-contrast
Comparison: 07/29/2010

CLINICAL DATA: Chest pain and syncope.

PORTABLE CHEST - 1 VIEW

[view not recorded]
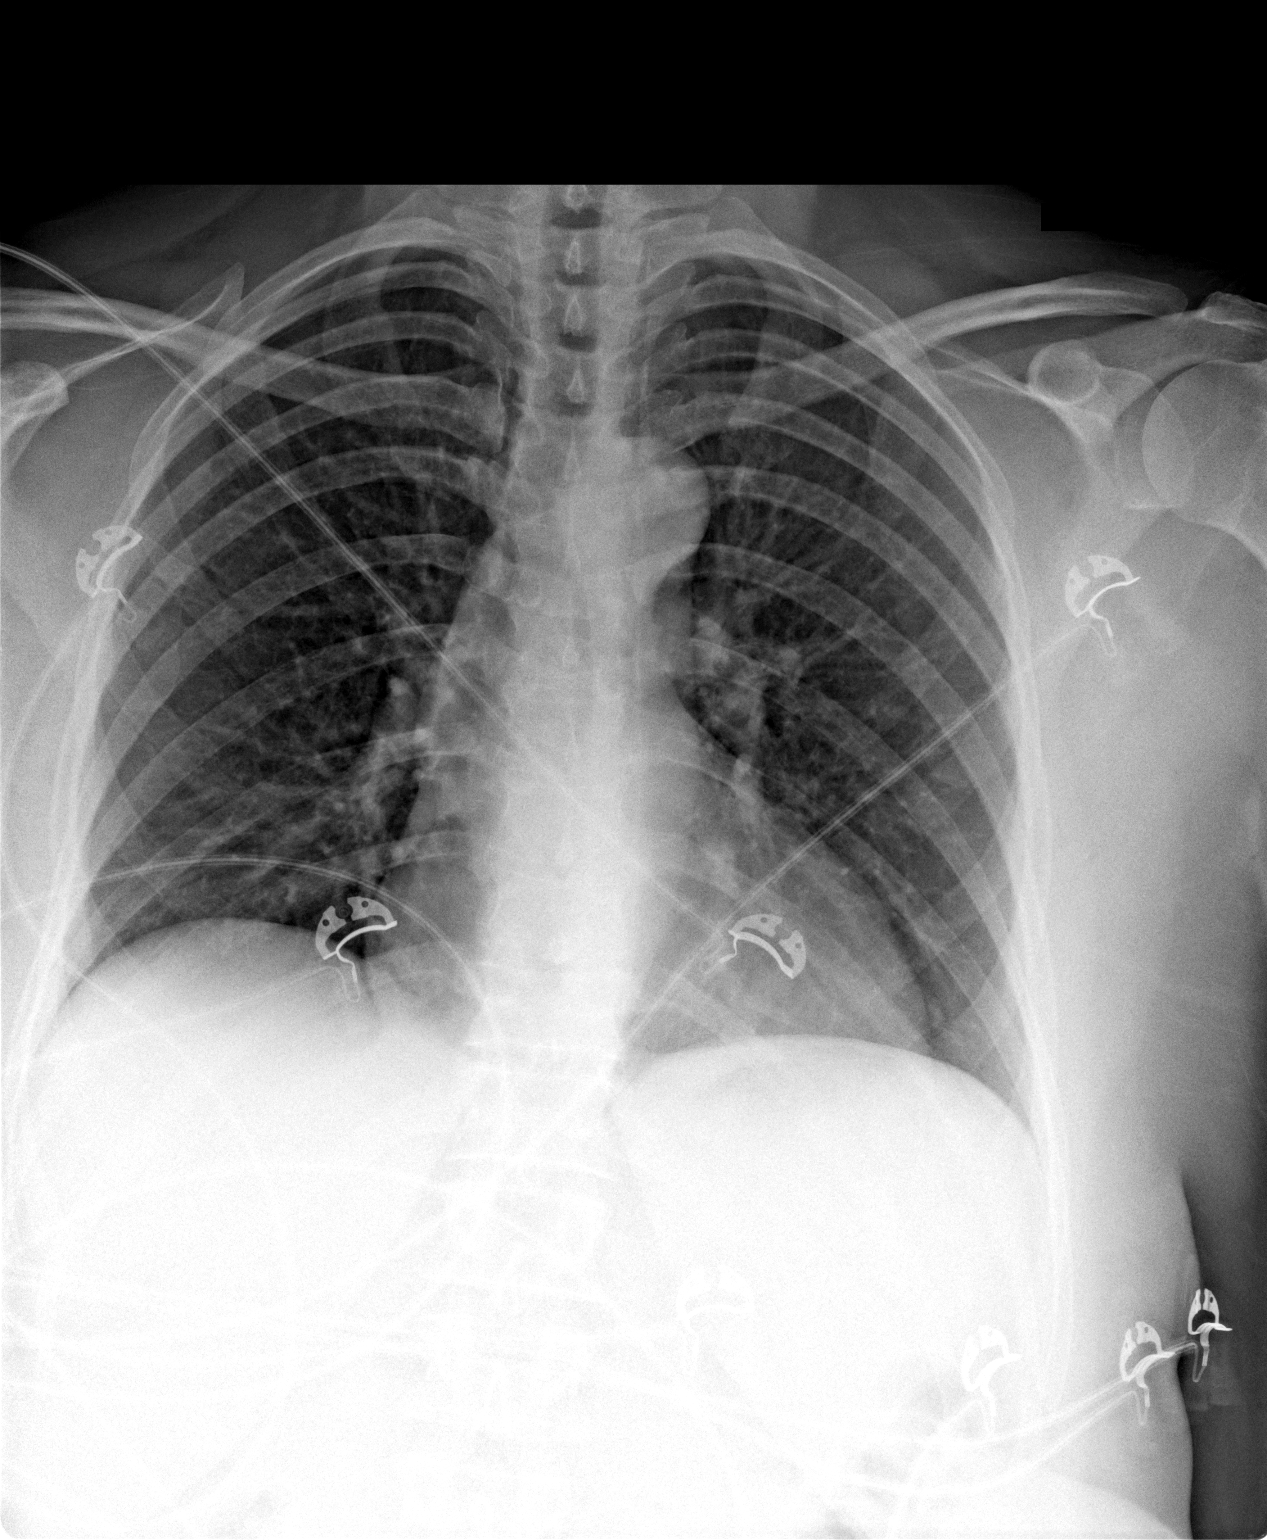

[1 of 1 positions shown; findings below may reference images not displayed]

FINDINGS: The cardiomediastinal silhouette is unremarkable.
The lungs are clear.
There is no evidence of focal airspace disease, pulmonary edema,
pulmonary nodule/mass, pleural effusion, or pneumothorax.
No acute bony abnormalities are identified.
IMPRESSION: No evidence of active cardiopulmonary disease.

## 2012-06-14 ENCOUNTER — Emergency Department (HOSPITAL_BASED_OUTPATIENT_CLINIC_OR_DEPARTMENT_OTHER)
Admission: EM | Admit: 2012-06-14 | Discharge: 2012-06-14 | Disposition: A | Discharge status: Home / Self Care | Payer: Self-pay | Attending: Emergency Medicine | Admitting: Emergency Medicine

## 2012-06-14 ENCOUNTER — Encounter (HOSPITAL_BASED_OUTPATIENT_CLINIC_OR_DEPARTMENT_OTHER): Payer: Self-pay | Admitting: *Deleted

## 2012-06-14 DIAGNOSIS — J45909 Unspecified asthma, uncomplicated: Secondary | ICD-10-CM | POA: Insufficient documentation

## 2012-06-14 DIAGNOSIS — I10 Essential (primary) hypertension: Secondary | ICD-10-CM | POA: Insufficient documentation

## 2012-06-14 DIAGNOSIS — Z79899 Other long term (current) drug therapy: Secondary | ICD-10-CM | POA: Insufficient documentation

## 2012-06-14 DIAGNOSIS — T85693A Other mechanical complication of artificial skin graft and decellularized allodermis, initial encounter: Secondary | ICD-10-CM | POA: Insufficient documentation

## 2012-06-14 DIAGNOSIS — Z9889 Other specified postprocedural states: Secondary | ICD-10-CM | POA: Insufficient documentation

## 2012-06-14 DIAGNOSIS — Z8679 Personal history of other diseases of the circulatory system: Secondary | ICD-10-CM | POA: Insufficient documentation

## 2012-06-14 DIAGNOSIS — I509 Heart failure, unspecified: Secondary | ICD-10-CM | POA: Insufficient documentation

## 2012-06-14 DIAGNOSIS — Z87828 Personal history of other (healed) physical injury and trauma: Secondary | ICD-10-CM | POA: Insufficient documentation

## 2012-06-14 DIAGNOSIS — T86821 Skin graft (allograft) (autograft) failure: Secondary | ICD-10-CM

## 2012-06-14 DIAGNOSIS — Y832 Surgical operation with anastomosis, bypass or graft as the cause of abnormal reaction of the patient, or of later complication, without mention of misadventure at the time of the procedure: Secondary | ICD-10-CM | POA: Insufficient documentation

## 2012-06-14 MED ORDER — HYDROCODONE-ACETAMINOPHEN 5-325 MG PO TABS
1.0000 | ORAL_TABLET | Freq: Once | ORAL | Status: AC
  Administered 2012-06-14: 1 via ORAL
  Filled 2012-06-14: qty 1

## 2012-06-14 MED ORDER — HYDROCODONE-ACETAMINOPHEN 5-325 MG PO TABS: 1.0000 | ORAL_TABLET | Freq: Four times a day (QID) | ORAL | Status: DC | PRN
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
## 2012-07-10 ENCOUNTER — Encounter (HOSPITAL_BASED_OUTPATIENT_CLINIC_OR_DEPARTMENT_OTHER): Payer: Self-pay | Admitting: *Deleted

## 2012-07-10 ENCOUNTER — Emergency Department (HOSPITAL_BASED_OUTPATIENT_CLINIC_OR_DEPARTMENT_OTHER)
Admission: EM | Admit: 2012-07-10 | Discharge: 2012-07-10 | Disposition: A | Discharge status: Home / Self Care | Payer: Self-pay | Attending: Emergency Medicine | Admitting: Emergency Medicine

## 2012-07-10 DIAGNOSIS — L97521 Non-pressure chronic ulcer of other part of left foot limited to breakdown of skin: Secondary | ICD-10-CM

## 2012-07-10 DIAGNOSIS — I10 Essential (primary) hypertension: Secondary | ICD-10-CM | POA: Insufficient documentation

## 2012-07-10 DIAGNOSIS — Z8679 Personal history of other diseases of the circulatory system: Secondary | ICD-10-CM | POA: Insufficient documentation

## 2012-07-10 DIAGNOSIS — Z79899 Other long term (current) drug therapy: Secondary | ICD-10-CM | POA: Insufficient documentation

## 2012-07-10 DIAGNOSIS — L97509 Non-pressure chronic ulcer of other part of unspecified foot with unspecified severity: Secondary | ICD-10-CM | POA: Insufficient documentation

## 2012-07-10 DIAGNOSIS — J45909 Unspecified asthma, uncomplicated: Secondary | ICD-10-CM | POA: Insufficient documentation

## 2012-07-10 DIAGNOSIS — I509 Heart failure, unspecified: Secondary | ICD-10-CM | POA: Insufficient documentation

## 2012-07-10 MED ORDER — HYDROCODONE-ACETAMINOPHEN 5-325 MG PO TABS
2.0000 | ORAL_TABLET | Freq: Once | ORAL | Status: AC
  Administered 2012-07-10: 2 via ORAL
  Filled 2012-07-10: qty 2

## 2012-07-10 MED ORDER — HYDROCODONE-ACETAMINOPHEN 5-325 MG PO TABS: 1.0000 | ORAL_TABLET | Freq: Four times a day (QID) | ORAL | Status: DC | PRN
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
## 2012-07-10 NOTE — ED Notes (Signed)
C/o left foot pain.  Denies recent injury. Small reddened area noted to left foot.  Pt states a blister developed yesterday and popped with green and yellow drainage.  No fever.
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
## 2012-07-10 NOTE — ED Notes (Signed)
MD at bedside. 

## 2012-09-02 DIAGNOSIS — K219 Gastro-esophageal reflux disease without esophagitis: Secondary | ICD-10-CM | POA: Insufficient documentation

## 2012-09-02 DIAGNOSIS — E049 Nontoxic goiter, unspecified: Secondary | ICD-10-CM | POA: Insufficient documentation

## 2012-09-02 DIAGNOSIS — I10 Essential (primary) hypertension: Secondary | ICD-10-CM | POA: Insufficient documentation

## 2012-10-17 ENCOUNTER — Encounter (HOSPITAL_BASED_OUTPATIENT_CLINIC_OR_DEPARTMENT_OTHER): Payer: Self-pay | Admitting: *Deleted

## 2012-10-17 ENCOUNTER — Emergency Department (HOSPITAL_BASED_OUTPATIENT_CLINIC_OR_DEPARTMENT_OTHER)
Admission: EM | Admit: 2012-10-17 | Discharge: 2012-10-17 | Disposition: A | Discharge status: Home / Self Care | Payer: Self-pay | Attending: Emergency Medicine | Admitting: Emergency Medicine

## 2012-10-17 ENCOUNTER — Emergency Department (HOSPITAL_BASED_OUTPATIENT_CLINIC_OR_DEPARTMENT_OTHER): Payer: Self-pay

## 2012-10-17 DIAGNOSIS — M25532 Pain in left wrist: Secondary | ICD-10-CM

## 2012-10-17 DIAGNOSIS — Y9289 Other specified places as the place of occurrence of the external cause: Secondary | ICD-10-CM | POA: Insufficient documentation

## 2012-10-17 DIAGNOSIS — S8990XA Unspecified injury of unspecified lower leg, initial encounter: Secondary | ICD-10-CM | POA: Insufficient documentation

## 2012-10-17 DIAGNOSIS — W010XXA Fall on same level from slipping, tripping and stumbling without subsequent striking against object, initial encounter: Secondary | ICD-10-CM | POA: Insufficient documentation

## 2012-10-17 DIAGNOSIS — S99929A Unspecified injury of unspecified foot, initial encounter: Secondary | ICD-10-CM | POA: Insufficient documentation

## 2012-10-17 DIAGNOSIS — S59919A Unspecified injury of unspecified forearm, initial encounter: Secondary | ICD-10-CM | POA: Insufficient documentation

## 2012-10-17 DIAGNOSIS — M25572 Pain in left ankle and joints of left foot: Secondary | ICD-10-CM

## 2012-10-17 DIAGNOSIS — Z8679 Personal history of other diseases of the circulatory system: Secondary | ICD-10-CM | POA: Insufficient documentation

## 2012-10-17 DIAGNOSIS — J45909 Unspecified asthma, uncomplicated: Secondary | ICD-10-CM | POA: Insufficient documentation

## 2012-10-17 DIAGNOSIS — I509 Heart failure, unspecified: Secondary | ICD-10-CM | POA: Insufficient documentation

## 2012-10-17 DIAGNOSIS — Y9301 Activity, walking, marching and hiking: Secondary | ICD-10-CM | POA: Insufficient documentation

## 2012-10-17 DIAGNOSIS — S6990XA Unspecified injury of unspecified wrist, hand and finger(s), initial encounter: Secondary | ICD-10-CM | POA: Insufficient documentation

## 2012-10-17 DIAGNOSIS — IMO0002 Reserved for concepts with insufficient information to code with codable children: Secondary | ICD-10-CM | POA: Insufficient documentation

## 2012-10-17 DIAGNOSIS — Z79899 Other long term (current) drug therapy: Secondary | ICD-10-CM | POA: Insufficient documentation

## 2012-10-17 DIAGNOSIS — I10 Essential (primary) hypertension: Secondary | ICD-10-CM | POA: Insufficient documentation

## 2012-10-17 DIAGNOSIS — Z791 Long term (current) use of non-steroidal anti-inflammatories (NSAID): Secondary | ICD-10-CM | POA: Insufficient documentation

## 2012-10-17 DIAGNOSIS — S59909A Unspecified injury of unspecified elbow, initial encounter: Secondary | ICD-10-CM | POA: Insufficient documentation

## 2012-10-17 MED ORDER — IBUPROFEN 800 MG PO TABS: 800.0000 mg | ORAL_TABLET | Freq: Three times a day (TID) | ORAL | Status: DC

## 2012-10-17 MED ORDER — TRAMADOL HCL 50 MG PO TABS
50.0000 mg | ORAL_TABLET | Freq: Once | ORAL | Status: AC
  Administered 2012-10-17: 50 mg via ORAL
  Filled 2012-10-17: qty 1
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
## 2012-10-17 NOTE — ED Notes (Signed)
Valerie Haney [place] [employer] [place] landed on wrist having pain in left wrist and low back pain since the fall

## 2012-10-18 NOTE — ED Provider Notes (Signed)
Medical screening examination/treatment/procedure(s) were performed by non-physician practitioner and as supervising physician I was immediately available for consultation/collaboration.  Geoffery Lyons, MD 10/18/12 [phone]

## 2012-12-07 ENCOUNTER — Emergency Department (HOSPITAL_BASED_OUTPATIENT_CLINIC_OR_DEPARTMENT_OTHER)
Admission: EM | Admit: 2012-12-07 | Discharge: 2012-12-07 | Disposition: A | Discharge status: Home / Self Care | Payer: No Typology Code available for payment source | Attending: Emergency Medicine | Admitting: Emergency Medicine

## 2012-12-07 ENCOUNTER — Encounter (HOSPITAL_BASED_OUTPATIENT_CLINIC_OR_DEPARTMENT_OTHER): Payer: Self-pay | Admitting: *Deleted

## 2012-12-07 DIAGNOSIS — Z79899 Other long term (current) drug therapy: Secondary | ICD-10-CM | POA: Insufficient documentation

## 2012-12-07 DIAGNOSIS — I10 Essential (primary) hypertension: Secondary | ICD-10-CM | POA: Insufficient documentation

## 2012-12-07 DIAGNOSIS — J45909 Unspecified asthma, uncomplicated: Secondary | ICD-10-CM | POA: Insufficient documentation

## 2012-12-07 DIAGNOSIS — I509 Heart failure, unspecified: Secondary | ICD-10-CM | POA: Insufficient documentation

## 2012-12-07 DIAGNOSIS — H53149 Visual discomfort, unspecified: Secondary | ICD-10-CM | POA: Insufficient documentation

## 2012-12-07 DIAGNOSIS — G43909 Migraine, unspecified, not intractable, without status migrainosus: Secondary | ICD-10-CM | POA: Insufficient documentation

## 2012-12-07 MED ORDER — KETOROLAC TROMETHAMINE 60 MG/2ML IM SOLN
60.0000 mg | Freq: Once | INTRAMUSCULAR | Status: AC
  Administered 2012-12-07: 60 mg via INTRAMUSCULAR
  Filled 2012-12-07: qty 2

## 2012-12-07 MED ORDER — HYDROCODONE-ACETAMINOPHEN 5-325 MG PO TABS: 1.0000 | ORAL_TABLET | ORAL | Status: DC | PRN

## 2012-12-07 MED ORDER — HYDROCODONE-ACETAMINOPHEN 5-325 MG PO TABS: 1.0000 | ORAL_TABLET | Freq: Once | ORAL | Status: DC

## 2012-12-07 MED ORDER — IBUPROFEN 600 MG PO TABS: 600.0000 mg | ORAL_TABLET | Freq: Four times a day (QID) | ORAL | Status: DC | PRN

## 2012-12-07 MED ORDER — METOCLOPRAMIDE HCL 10 MG PO TABS: 10.0000 mg | ORAL_TABLET | Freq: Four times a day (QID) | ORAL | Status: DC | PRN

## 2012-12-07 MED ORDER — METOCLOPRAMIDE HCL 10 MG PO TABS
10.0000 mg | ORAL_TABLET | Freq: Once | ORAL | Status: AC
  Administered 2012-12-07: 10 mg via ORAL
  Filled 2012-12-07: qty 1

## 2012-12-07 MED ORDER — DIPHENHYDRAMINE HCL 25 MG PO CAPS
25.0000 mg | ORAL_CAPSULE | Freq: Once | ORAL | Status: AC
  Administered 2012-12-07: 25 mg via ORAL
  Filled 2012-12-07: qty 1

## 2012-12-07 NOTE — ED Provider Notes (Signed)
CSN: [id number]     Arrival date & time 12/07/12  1647 History   First MD Initiated Contact with Patient 12/07/12 1652     Chief Complaint  Patient presents with  . Migraine   (Consider location/radiation/quality/duration/timing/severity/associated sxs/prior Treatment) HPI Patient is a 42 year old female with a long history of migraines who presents the emergency department with identical headache to previous migraines. Patient describes the headache as gradual onset throbbing pain in the frontal area of her head this started roughly 2 hours ago. She she does have photophobia. No nausea. She has no focal weakness. She has no visual changes. She is ambulating without difficulty. She denies any neck stiffness or pain. No recent URI symptoms. No fevers or chills. Past Medical History  Diagnosis Date  . Asthma   . Blood transfusion   . CHF (congestive heart failure)   . Hypertension   . Cardiac arrhythmia   . Migraine    Past Surgical History  Procedure Laterality Date  . Abdominal hysterectomy    . Tonsillectomy     No family history on file. History  Substance Use Topics  . Smoking status: Never Smoker   . Smokeless tobacco: Not on file  . Alcohol Use: No   OB History   Grav Para Term Preterm Abortions TAB SAB Ect Mult Living                 Review of Systems  Constitutional: Negative for fever and chills.  HENT: Negative for congestion, rhinorrhea, neck pain, neck stiffness and sinus pressure.   Eyes: Positive for photophobia. Negative for visual disturbance.  Respiratory: Negative for shortness of breath.   Gastrointestinal: Negative for nausea, vomiting, abdominal pain, diarrhea and constipation.  Skin: Negative for rash and wound.  Neurological: Positive for headaches. Negative for dizziness, syncope, weakness, light-headedness and numbness.  All other systems reviewed and are negative.    Allergies  Review of patient's allergies indicates no known  allergies.  Home Medications   Current Outpatient Rx  Name  Route  Sig  Dispense  Refill  . albuterol (PROVENTIL HFA;VENTOLIN HFA) 108 (90 BASE) MCG/ACT inhaler   Inhalation   Inhale 2 puffs into the lungs every 6 (six) hours as needed. For shortness of breath and wheezing          . albuterol (PROVENTIL) (2.5 MG/3ML) 0.083% nebulizer solution   Nebulization   Take 2.5 mg by nebulization every 6 (six) hours as needed.           Marland Kitchen HYDROcodone-acetaminophen (NORCO/VICODIN) 5-325 MG per tablet   Oral   Take 1-2 tablets by mouth every 6 (six) hours as needed for pain.   10 tablet   0   . ibuprofen (ADVIL,MOTRIN) 800 MG tablet   Oral   Take 1 tablet (800 mg total) by mouth 3 (three) times daily.   21 tablet   0   . metoprolol (TOPROL-XL) 100 MG 24 hr tablet   Oral   Take 100 mg by mouth daily.           . Topiramate (TOPAMAX PO)   Oral   Take by mouth.            BP 122/79  Pulse 72  Temp(Src) 98.5 F (36.9 C) (Oral)  Resp 18  Ht 5\' 5"  (1.651 m)  Wt 200 lb (90.719 kg)  BMI 33.28 kg/m2  SpO2 98% Physical Exam  Nursing note and vitals reviewed. Constitutional: She is oriented to person, place, and  time. She appears well-developed and well-nourished. No distress.  Patient is lying on stretcher in the room with the lights out. She is talking on the phone. She is in no acute distress.  HENT:  Head: Normocephalic and atraumatic.  Mouth/Throat: Oropharynx is clear and moist.  No sinus tenderness to palpation  Eyes: EOM are normal. Pupils are equal, round, and reactive to light.  Neck: Normal range of motion. Neck supple.  The neck stiffness  Cardiovascular: Normal rate and regular rhythm.   Pulmonary/Chest: Effort normal and breath sounds normal. No respiratory distress. She has no wheezes. She has no rales.  Abdominal: Soft. Bowel sounds are normal.  Musculoskeletal: Normal range of motion. She exhibits no edema and no tenderness.  Neurological: She is alert  and oriented to person, place, and time.  Patient is alert and oriented x3 with clear, goal oriented speech. Patient has 5/5 motor in all extremities. Sensation is intact to light touch. Patient has a normal gait and walks without assistance.   Skin: Skin is warm and dry. No rash noted. No erythema.  Psychiatric: She has a normal mood and affect. Her behavior is normal.    ED Course  Procedures (including critical care time) Labs Review Labs Reviewed - No data to display Imaging Review No results found.  MDM  No concerning signs or symptoms progress for more serious cause for patient's headache. Will treat symptomatically. Patient states her headache is improved. Return precautions given.  Loren Racer, MD 12/09/12 [phone]

## 2012-12-07 NOTE — ED Notes (Signed)
Headache x 2 hours. Hx of migraine headaches.

## 2013-01-01 ENCOUNTER — Encounter (HOSPITAL_BASED_OUTPATIENT_CLINIC_OR_DEPARTMENT_OTHER): Payer: Self-pay | Admitting: *Deleted

## 2013-01-01 DIAGNOSIS — Z791 Long term (current) use of non-steroidal anti-inflammatories (NSAID): Secondary | ICD-10-CM | POA: Insufficient documentation

## 2013-01-01 DIAGNOSIS — Z79899 Other long term (current) drug therapy: Secondary | ICD-10-CM | POA: Insufficient documentation

## 2013-01-01 DIAGNOSIS — G43909 Migraine, unspecified, not intractable, without status migrainosus: Secondary | ICD-10-CM | POA: Insufficient documentation

## 2013-01-01 DIAGNOSIS — J45909 Unspecified asthma, uncomplicated: Secondary | ICD-10-CM | POA: Insufficient documentation

## 2013-01-01 DIAGNOSIS — I10 Essential (primary) hypertension: Secondary | ICD-10-CM | POA: Insufficient documentation

## 2013-01-01 DIAGNOSIS — I509 Heart failure, unspecified: Secondary | ICD-10-CM | POA: Insufficient documentation

## 2013-01-01 NOTE — ED Notes (Signed)
Pt c/o migraine that began 1.5 hours PTA. Pt sts she did not take anything for it due to being out of her meds.

## 2013-01-02 ENCOUNTER — Emergency Department (HOSPITAL_BASED_OUTPATIENT_CLINIC_OR_DEPARTMENT_OTHER)
Admission: EM | Admit: 2013-01-02 | Discharge: 2013-01-02 | Disposition: A | Discharge status: Home / Self Care | Payer: No Typology Code available for payment source | Attending: Emergency Medicine | Admitting: Emergency Medicine

## 2013-01-02 DIAGNOSIS — G43909 Migraine, unspecified, not intractable, without status migrainosus: Secondary | ICD-10-CM

## 2013-01-02 MED ORDER — DIPHENHYDRAMINE HCL 50 MG/ML IJ SOLN
50.0000 mg | Freq: Once | INTRAMUSCULAR | Status: AC
  Administered 2013-01-02: 50 mg via INTRAMUSCULAR
  Filled 2013-01-02: qty 1

## 2013-01-02 MED ORDER — DEXAMETHASONE SODIUM PHOSPHATE 10 MG/ML IJ SOLN
10.0000 mg | Freq: Once | INTRAMUSCULAR | Status: AC
  Administered 2013-01-02: 10 mg via INTRAMUSCULAR
  Filled 2013-01-02: qty 1

## 2013-01-02 MED ORDER — METOCLOPRAMIDE HCL 5 MG/ML IJ SOLN
10.0000 mg | Freq: Once | INTRAMUSCULAR | Status: AC
  Administered 2013-01-02: 10 mg via INTRAMUSCULAR
  Filled 2013-01-02: qty 2

## 2013-01-02 MED ORDER — METOCLOPRAMIDE HCL 10 MG PO TABS: 10.0000 mg | ORAL_TABLET | Freq: Four times a day (QID) | ORAL | Status: DC

## 2013-01-02 MED ORDER — HYDROCODONE-ACETAMINOPHEN 5-325 MG PO TABS: 2.0000 | ORAL_TABLET | ORAL | Status: DC | PRN

## 2013-01-02 NOTE — ED Provider Notes (Signed)
CSN: [id number]     Arrival date & time 01/01/13  2259 History   First MD Initiated Contact with Patient 01/02/13 0029     Chief Complaint  Patient presents with  . Migraine   (Consider location/radiation/quality/duration/timing/severity/associated sxs/prior Treatment) HPI Comments: Patient history of migraines presents with a one-day history of a migraine headache. She complains of a constant throbbing pain across her bifrontal area. It started about 3 hours ago is gradually been getting worse since then. She's had some nausea but no vomiting. She denies a fevers or chills. She's had some mild photophobia. She has a long-standing history of migraines and says that this is the same type of pain that she normally has with her migraines. She was previously seeing a neurologist but lost her insurance and hasn't seen one in a while. She recently did get an orange card so she feels that she'll be able to get in to see a neurologist.  Patient is a 42 y.o. female presenting with migraines.  Migraine Associated symptoms include headaches. Pertinent negatives include no chest pain, no abdominal pain and no shortness of breath.    Past Medical History  Diagnosis Date  . Asthma   . Blood transfusion   . CHF (congestive heart failure)   . Hypertension   . Cardiac arrhythmia   . Migraine    Past Surgical History  Procedure Laterality Date  . Abdominal hysterectomy    . Tonsillectomy     No family history on file. History  Substance Use Topics  . Smoking status: Never Smoker   . Smokeless tobacco: Not on file  . Alcohol Use: No   OB History   Grav Para Term Preterm Abortions TAB SAB Ect Mult Living                 Review of Systems  Constitutional: Negative for fever, chills, diaphoresis and fatigue.  HENT: Negative for congestion, rhinorrhea and sneezing.   Eyes: Positive for photophobia.  Respiratory: Negative for cough, chest tightness and shortness of breath.   Cardiovascular:  Negative for chest pain and leg swelling.  Gastrointestinal: Positive for nausea. Negative for vomiting, abdominal pain, diarrhea and blood in stool.  Genitourinary: Negative for frequency, hematuria, flank pain and difficulty urinating.  Musculoskeletal: Negative for back pain and arthralgias.  Skin: Negative for rash.  Neurological: Positive for headaches. Negative for dizziness, speech difficulty, weakness and numbness.    Allergies  Review of patient's allergies indicates no known allergies.  Home Medications   Current Outpatient Rx  Name  Route  Sig  Dispense  Refill  . albuterol (PROVENTIL HFA;VENTOLIN HFA) 108 (90 BASE) MCG/ACT inhaler   Inhalation   Inhale 2 puffs into the lungs every 6 (six) hours as needed. For shortness of breath and wheezing          . albuterol (PROVENTIL) (2.5 MG/3ML) 0.083% nebulizer solution   Nebulization   Take 2.5 mg by nebulization every 6 (six) hours as needed.           Marland Kitchen HYDROcodone-acetaminophen (NORCO) 5-325 MG per tablet   Oral   Take 1 tablet by mouth every 4 (four) hours as needed for pain.   10 tablet   0   . HYDROcodone-acetaminophen (NORCO/VICODIN) 5-325 MG per tablet   Oral   Take 1-2 tablets by mouth every 6 (six) hours as needed for pain.   10 tablet   0   . HYDROcodone-acetaminophen (NORCO/VICODIN) 5-325 MG per tablet   Oral  Take 2 tablets by mouth every 4 (four) hours as needed for pain.   10 tablet   0   . ibuprofen (ADVIL,MOTRIN) 600 MG tablet   Oral   Take 1 tablet (600 mg total) by mouth every 6 (six) hours as needed for pain.   30 tablet   0   . ibuprofen (ADVIL,MOTRIN) 800 MG tablet   Oral   Take 1 tablet (800 mg total) by mouth 3 (three) times daily.   21 tablet   0   . metoCLOPramide (REGLAN) 10 MG tablet   Oral   Take 1 tablet (10 mg total) by mouth every 6 (six) hours as needed (nausea/headache).   6 tablet   0   . metoCLOPramide (REGLAN) 10 MG tablet   Oral   Take 1 tablet (10 mg total)  by mouth every 6 (six) hours.   15 tablet   0   . metoprolol (TOPROL-XL) 100 MG 24 hr tablet   Oral   Take 100 mg by mouth daily.           . Topiramate (TOPAMAX PO)   Oral   Take by mouth.            BP 128/87  Pulse 85  Temp(Src) 99.5 F (37.5 C) (Oral)  Resp 18  Ht 5\' 5"  (1.651 m)  Wt 200 lb (90.719 kg)  BMI 33.28 kg/m2  SpO2 100% Physical Exam  Constitutional: She is oriented to person, place, and time. She appears well-developed and well-nourished.  HENT:  Head: Normocephalic and atraumatic.  Mouth/Throat: Oropharynx is clear and moist.  Eyes: Conjunctivae are normal. Pupils are equal, round, and reactive to light.  Neck: Normal range of motion. Neck supple.  No neck stiffness  Cardiovascular: Normal rate, regular rhythm and normal heart sounds.   Pulmonary/Chest: Effort normal and breath sounds normal. No respiratory distress. She has no wheezes. She has no rales. She exhibits no tenderness.  Abdominal: Soft. Bowel sounds are normal. There is no tenderness. There is no rebound and no guarding.  Musculoskeletal: Normal range of motion. She exhibits no edema.  Lymphadenopathy:    She has no cervical adenopathy.  Neurological: She is alert and oriented to person, place, and time. She has normal strength. No cranial nerve deficit or sensory deficit. GCS eye subscore is 4. GCS verbal subscore is 5. GCS motor subscore is 6.  Finger to nose intact  Skin: Skin is warm and dry. No rash noted.  Psychiatric: She has a normal mood and affect.    ED Course  Procedures (including critical care time) Labs Review Labs Reviewed - No data to display Imaging Review No results found.  MDM   1. Migraine    Patient is given a migraine cocktail intramuscularly. She did not want an IV started. She does not on a week after the medications. She feels like they worked before and she wants to just go home. Her headache seems consistent with a past migraines. There's no unusual  symptoms that would be concerning for subarachnoid hemorrhage or meningitis.    Rolan Bucco, MD 01/02/13 [phone]

## 2013-01-02 NOTE — ED Notes (Signed)
MD at bedside. 

## 2013-01-18 ENCOUNTER — Emergency Department (HOSPITAL_BASED_OUTPATIENT_CLINIC_OR_DEPARTMENT_OTHER)
Admission: EM | Admit: 2013-01-18 | Discharge: 2013-01-18 | Disposition: A | Discharge status: Home / Self Care | Payer: No Typology Code available for payment source | Attending: Emergency Medicine | Admitting: Emergency Medicine

## 2013-01-18 ENCOUNTER — Encounter (HOSPITAL_BASED_OUTPATIENT_CLINIC_OR_DEPARTMENT_OTHER): Payer: Self-pay | Admitting: Emergency Medicine

## 2013-01-18 DIAGNOSIS — R1110 Vomiting, unspecified: Secondary | ICD-10-CM | POA: Insufficient documentation

## 2013-01-18 DIAGNOSIS — I10 Essential (primary) hypertension: Secondary | ICD-10-CM | POA: Insufficient documentation

## 2013-01-18 DIAGNOSIS — IMO0002 Reserved for concepts with insufficient information to code with codable children: Secondary | ICD-10-CM | POA: Insufficient documentation

## 2013-01-18 DIAGNOSIS — I509 Heart failure, unspecified: Secondary | ICD-10-CM | POA: Insufficient documentation

## 2013-01-18 DIAGNOSIS — Z79899 Other long term (current) drug therapy: Secondary | ICD-10-CM | POA: Insufficient documentation

## 2013-01-18 DIAGNOSIS — J45901 Unspecified asthma with (acute) exacerbation: Secondary | ICD-10-CM

## 2013-01-18 DIAGNOSIS — G43909 Migraine, unspecified, not intractable, without status migrainosus: Secondary | ICD-10-CM

## 2013-01-18 MED ORDER — PREDNISONE 50 MG PO TABS
60.0000 mg | ORAL_TABLET | Freq: Once | ORAL | Status: AC
  Administered 2013-01-18: 23:00:00 60 mg via ORAL
  Filled 2013-01-18 (×2): qty 1

## 2013-01-18 MED ORDER — KETOROLAC TROMETHAMINE 30 MG/ML IJ SOLN
60.0000 mg | Freq: Once | INTRAMUSCULAR | Status: AC
  Administered 2013-01-18: 60 mg via INTRAMUSCULAR

## 2013-01-18 MED ORDER — METOCLOPRAMIDE HCL 5 MG/ML IJ SOLN
10.0000 mg | Freq: Once | INTRAMUSCULAR | Status: DC
  Filled 2013-01-18: qty 2

## 2013-01-18 MED ORDER — KETOROLAC TROMETHAMINE 30 MG/ML IJ SOLN
30.0000 mg | Freq: Once | INTRAMUSCULAR | Status: DC
  Filled 2013-01-18: qty 1

## 2013-01-18 MED ORDER — METOCLOPRAMIDE HCL 5 MG/ML IJ SOLN
10.0000 mg | Freq: Once | INTRAMUSCULAR | Status: AC
  Administered 2013-01-18: 10 mg via INTRAMUSCULAR

## 2013-01-18 MED ORDER — DIPHENHYDRAMINE HCL 50 MG/ML IJ SOLN
25.0000 mg | Freq: Once | INTRAMUSCULAR | Status: AC
  Administered 2013-01-18: 25 mg via INTRAMUSCULAR

## 2013-01-18 MED ORDER — ALBUTEROL SULFATE (5 MG/ML) 0.5% IN NEBU
10.0000 mg | INHALATION_SOLUTION | Freq: Once | RESPIRATORY_TRACT | Status: AC
Start: 2013-01-18 — End: 2013-01-18
  Administered 2013-01-18: 10 mg via RESPIRATORY_TRACT
  Filled 2013-01-18: qty 2

## 2013-01-18 MED ORDER — ALBUTEROL SULFATE HFA 108 (90 BASE) MCG/ACT IN AERS
1.0000 | INHALATION_SPRAY | RESPIRATORY_TRACT | Status: DC | PRN
  Administered 2013-01-18: 2 via RESPIRATORY_TRACT
  Filled 2013-01-18: qty 6.7

## 2013-01-18 MED ORDER — ALBUTEROL SULFATE (2.5 MG/3ML) 0.083% IN NEBU: 2.5000 mg | INHALATION_SOLUTION | Freq: Four times a day (QID) | RESPIRATORY_TRACT | Status: DC | PRN

## 2013-01-18 MED ORDER — PREDNISONE 20 MG PO TABS: 60.0000 mg | ORAL_TABLET | Freq: Every day | ORAL | Status: DC

## 2013-01-18 MED ORDER — DIPHENHYDRAMINE HCL 50 MG/ML IJ SOLN
25.0000 mg | Freq: Once | INTRAMUSCULAR | Status: DC
  Filled 2013-01-18: qty 1

## 2013-01-18 MED ORDER — SODIUM CHLORIDE 0.9 % IV BOLUS (SEPSIS): 1000.0000 mL | Freq: Once | INTRAVENOUS | Status: DC

## 2013-01-18 NOTE — ED Notes (Signed)
Pt. Reports she did not want an IV so EDP ordered meds IM

## 2013-01-18 NOTE — ED Notes (Signed)
Sob. States she is having an asthma attack. Used her inhaler without improvement. Headache.

## 2013-01-18 NOTE — ED Provider Notes (Signed)
CSN: [id number]     Arrival date & time 01/18/13  2152 History   None    This chart was scribed for Celene Kras, MD by Ladona Ridgel Day, ED scribe. This patient was seen in room MH09/MH09 and the patient's care was started at 2152.  Chief Complaint  Patient presents with  . Shortness of Breath   The history is provided by the patient. No language interpreter was used.   HPI Comments: Valerie Haney is a 42 y.o. female who presents to the Emergency Department w/hx of asthma complaining of asthma attack this PM, onset 40 minutes PTA w/associated cough, SOB and wheezing. She states tried inhaler at home w/out relief from SOB. She states posttussive emesis episodes and HA which she attributes to hx of migraines. She denies fever/chills.  Past Medical History  Diagnosis Date  . Asthma   . Blood transfusion   . CHF (congestive heart failure)   . Hypertension   . Cardiac arrhythmia   . Migraine    Past Surgical History  Procedure Laterality Date  . Abdominal hysterectomy    . Tonsillectomy     No family history on file. History  Substance Use Topics  . Smoking status: Never Smoker   . Smokeless tobacco: Not on file  . Alcohol Use: No   OB History   Grav Para Term Preterm Abortions TAB SAB Ect Mult Living                 Review of Systems  Constitutional: Negative for fever and chills.  HENT: Negative for trouble swallowing.   Respiratory: Positive for cough and shortness of breath.   Gastrointestinal: Positive for vomiting. Negative for nausea and anal bleeding.  Neurological: Negative for syncope and weakness.  All other systems reviewed and are negative.   A complete 10 system review of systems was obtained and all systems are negative except as noted in the HPI and PMH.   Allergies  Review of patient's allergies indicates no known allergies.  Home Medications   Current Outpatient Rx  Name  Route  Sig  Dispense  Refill  . albuterol (PROVENTIL HFA;VENTOLIN HFA) 108 (90  BASE) MCG/ACT inhaler   Inhalation   Inhale 2 puffs into the lungs every 6 (six) hours as needed. For shortness of breath and wheezing          . albuterol (PROVENTIL) (2.5 MG/3ML) 0.083% nebulizer solution   Nebulization   Take 2.5 mg by nebulization every 6 (six) hours as needed.           Marland Kitchen HYDROcodone-acetaminophen (NORCO) 5-325 MG per tablet   Oral   Take 1 tablet by mouth every 4 (four) hours as needed for pain.   10 tablet   0   . HYDROcodone-acetaminophen (NORCO/VICODIN) 5-325 MG per tablet   Oral   Take 1-2 tablets by mouth every 6 (six) hours as needed for pain.   10 tablet   0   . HYDROcodone-acetaminophen (NORCO/VICODIN) 5-325 MG per tablet   Oral   Take 2 tablets by mouth every 4 (four) hours as needed for pain.   10 tablet   0   . ibuprofen (ADVIL,MOTRIN) 600 MG tablet   Oral   Take 1 tablet (600 mg total) by mouth every 6 (six) hours as needed for pain.   30 tablet   0   . ibuprofen (ADVIL,MOTRIN) 800 MG tablet   Oral   Take 1 tablet (800 mg total) by mouth 3 (three)  times daily.   21 tablet   0   . metoCLOPramide (REGLAN) 10 MG tablet   Oral   Take 1 tablet (10 mg total) by mouth every 6 (six) hours as needed (nausea/headache).   6 tablet   0   . metoCLOPramide (REGLAN) 10 MG tablet   Oral   Take 1 tablet (10 mg total) by mouth every 6 (six) hours.   15 tablet   0   . metoprolol (TOPROL-XL) 100 MG 24 hr tablet   Oral   Take 100 mg by mouth daily.           . predniSONE (DELTASONE) 20 MG tablet   Oral   Take 3 tablets (60 mg total) by mouth daily.   15 tablet   0   . Topiramate (TOPAMAX PO)   Oral   Take by mouth.            Triage Vitals: BP 119/101  Pulse 84  Temp(Src) 98.5 F (36.9 C) (Oral)  Resp 24  Ht 5\' 5"  (1.651 m)  Wt 200 lb (90.719 kg)  BMI 33.28 kg/m2  SpO2 95% Physical Exam  Nursing note and vitals reviewed. Constitutional: She appears well-developed and well-nourished. No distress.  HENT:  Head:  Normocephalic and atraumatic.  Right Ear: External ear normal.  Left Ear: External ear normal.  Eyes: Conjunctivae are normal. Right eye exhibits no discharge. Left eye exhibits no discharge. No scleral icterus.  Neck: Neck supple. No tracheal deviation present.  Cardiovascular: Normal rate, regular rhythm and intact distal pulses.   Pulmonary/Chest: Effort normal. No stridor. No respiratory distress. She has wheezes. She has no rales.  Abdominal: Soft. Bowel sounds are normal. She exhibits no distension. There is no tenderness. There is no rebound and no guarding.  Musculoskeletal: She exhibits no edema and no tenderness.  Neurological: She is alert. She has normal strength. No sensory deficit. Cranial nerve deficit:  no gross defecits noted. She exhibits normal muscle tone. She displays no seizure activity. Coordination normal.  Skin: Skin is warm and dry. No rash noted.  Psychiatric: She has a normal mood and affect.    ED Course  Procedures (including critical care time) DIAGNOSTIC STUDIES: Oxygen Saturation is 100% on room air, normal by my interpretation.    COORDINATION OF CARE: At 1018 PM Discussed treatment plan with patient which includes IV fluids, reglan, benadryl, toradol, proventil. Patient agrees.   Labs Review Labs Reviewed - No data to display Imaging Review No results found.  EKG Interpretation   None      Medications  sodium chloride 0.9 % bolus 1,000 mL (1,000 mLs Intravenous Not Given 01/18/13 2252)  albuterol (PROVENTIL HFA;VENTOLIN HFA) 108 (90 BASE) MCG/ACT inhaler 1-2 puff (not administered)  albuterol (PROVENTIL) (5 MG/ML) 0.5% nebulizer solution 10 mg (10 mg Nebulization Given 01/18/13 2202)  predniSONE (DELTASONE) tablet 60 mg (60 mg Oral Given 01/18/13 2250)  diphenhydrAMINE (BENADRYL) injection 25 mg (25 mg Intramuscular Given 01/18/13 2251)  ketorolac (TORADOL) 30 MG/ML injection 60 mg (60 mg Intramuscular Given 01/18/13 2250)  metoCLOPramide  (REGLAN) injection 10 mg (10 mg Intramuscular Given 01/18/13 2251)    1100  patient is feeling better after treatments. On repeat exam she is no longer wheezing The patient was given injections as requested for her migraine headache. She is feeling better MDM   1. Asthma exacerbation   2. Migraine headache    Patient's symptoms are consistent with an asthma exacerbation. She improved after treatment here in  the emergency department.  I personally performed the services described in this documentation, which was scribed in my presence.  The recorded information has been reviewed and is accurate.     Celene Kras, MD 01/18/13 [phone]

## 2013-02-12 ENCOUNTER — Encounter (HOSPITAL_BASED_OUTPATIENT_CLINIC_OR_DEPARTMENT_OTHER): Payer: Self-pay | Admitting: Emergency Medicine

## 2013-02-12 ENCOUNTER — Emergency Department (HOSPITAL_BASED_OUTPATIENT_CLINIC_OR_DEPARTMENT_OTHER)
Admission: EM | Admit: 2013-02-12 | Discharge: 2013-02-12 | Disposition: A | Discharge status: Home / Self Care | Payer: No Typology Code available for payment source | Attending: Emergency Medicine | Admitting: Emergency Medicine

## 2013-02-12 DIAGNOSIS — G43909 Migraine, unspecified, not intractable, without status migrainosus: Secondary | ICD-10-CM | POA: Insufficient documentation

## 2013-02-12 DIAGNOSIS — IMO0002 Reserved for concepts with insufficient information to code with codable children: Secondary | ICD-10-CM | POA: Insufficient documentation

## 2013-02-12 DIAGNOSIS — I10 Essential (primary) hypertension: Secondary | ICD-10-CM | POA: Insufficient documentation

## 2013-02-12 DIAGNOSIS — J45909 Unspecified asthma, uncomplicated: Secondary | ICD-10-CM | POA: Insufficient documentation

## 2013-02-12 DIAGNOSIS — Z79899 Other long term (current) drug therapy: Secondary | ICD-10-CM | POA: Insufficient documentation

## 2013-02-12 DIAGNOSIS — I509 Heart failure, unspecified: Secondary | ICD-10-CM | POA: Insufficient documentation

## 2013-02-12 MED ORDER — METOCLOPRAMIDE HCL 5 MG/ML IJ SOLN
10.0000 mg | Freq: Once | INTRAMUSCULAR | Status: AC
  Administered 2013-02-12: 10 mg via INTRAMUSCULAR
  Filled 2013-02-12: qty 2

## 2013-02-12 MED ORDER — DEXAMETHASONE SODIUM PHOSPHATE 10 MG/ML IJ SOLN
10.0000 mg | Freq: Once | INTRAMUSCULAR | Status: AC
  Administered 2013-02-12: 10 mg via INTRAMUSCULAR
  Filled 2013-02-12: qty 1

## 2013-02-12 MED ORDER — DIPHENHYDRAMINE HCL 50 MG/ML IJ SOLN
50.0000 mg | Freq: Once | INTRAMUSCULAR | Status: AC
  Administered 2013-02-12: 50 mg via INTRAMUSCULAR
  Filled 2013-02-12: qty 1

## 2013-02-12 NOTE — ED Notes (Signed)
C/o migraine HA x 2 hours-nausea-no vomiting

## 2013-02-12 NOTE — ED Provider Notes (Signed)
CSN: [id number]     Arrival date & time 02/12/13  1106 History   First MD Initiated Contact with Patient 02/12/13 1121     Chief Complaint  Patient presents with  . Migraine   (Consider location/radiation/quality/duration/timing/severity/associated sxs/prior Treatment) Patient is a 42 y.o. female presenting with migraines. The history is provided by the patient.  Migraine Associated symptoms include headaches. Pertinent negatives include no abdominal pain and no shortness of breath.   patient with known history of migraines occur approximately once a month. This migraine started this morning a few hours ago bilateral greater on the right side around the eye then left side. 10 out of 10 throbbing in nature associated with some photophobia and nausea no vomiting no fever. Typical for her migraines other than the fact that there is some increased right eye pain. Patient was seen first name October 7 and treated with Reglan Decadron and Benadryl with success.  Past Medical History  Diagnosis Date  . Asthma   . Blood transfusion   . CHF (congestive heart failure)   . Hypertension   . Cardiac arrhythmia   . Migraine    Past Surgical History  Procedure Laterality Date  . Abdominal hysterectomy    . Tonsillectomy     No family history on file. History  Substance Use Topics  . Smoking status: Never Smoker   . Smokeless tobacco: Not on file  . Alcohol Use: No   OB History   Grav Para Term Preterm Abortions TAB SAB Ect Mult Living                 Review of Systems  Constitutional: Negative for fever.  HENT: Negative for congestion.   Eyes: Positive for photophobia and pain. Negative for redness.  Respiratory: Negative for shortness of breath.   Gastrointestinal: Positive for nausea. Negative for vomiting and abdominal pain.  Genitourinary: Negative for dysuria.  Musculoskeletal: Negative for back pain.  Skin: Negative for rash.  Neurological: Positive for headaches.   Hematological: Does not bruise/bleed easily.  Psychiatric/Behavioral: Negative for confusion.    Allergies  Review of patient's allergies indicates no known allergies.  Home Medications   Current Outpatient Rx  Name  Route  Sig  Dispense  Refill  . albuterol (PROVENTIL HFA;VENTOLIN HFA) 108 (90 BASE) MCG/ACT inhaler   Inhalation   Inhale 2 puffs into the lungs every 6 (six) hours as needed. For shortness of breath and wheezing          . albuterol (PROVENTIL) (2.5 MG/3ML) 0.083% nebulizer solution   Nebulization   Take 3 mLs (2.5 mg total) by nebulization every 6 (six) hours as needed.   75 mL   0   . HYDROcodone-acetaminophen (NORCO) 5-325 MG per tablet   Oral   Take 1 tablet by mouth every 4 (four) hours as needed for pain.   10 tablet   0   . HYDROcodone-acetaminophen (NORCO/VICODIN) 5-325 MG per tablet   Oral   Take 1-2 tablets by mouth every 6 (six) hours as needed for pain.   10 tablet   0   . HYDROcodone-acetaminophen (NORCO/VICODIN) 5-325 MG per tablet   Oral   Take 2 tablets by mouth every 4 (four) hours as needed for pain.   10 tablet   0   . ibuprofen (ADVIL,MOTRIN) 600 MG tablet   Oral   Take 1 tablet (600 mg total) by mouth every 6 (six) hours as needed for pain.   30 tablet   0   .  ibuprofen (ADVIL,MOTRIN) 800 MG tablet   Oral   Take 1 tablet (800 mg total) by mouth 3 (three) times daily.   21 tablet   0   . metoCLOPramide (REGLAN) 10 MG tablet   Oral   Take 1 tablet (10 mg total) by mouth every 6 (six) hours as needed (nausea/headache).   6 tablet   0   . metoCLOPramide (REGLAN) 10 MG tablet   Oral   Take 1 tablet (10 mg total) by mouth every 6 (six) hours.   15 tablet   0   . metoprolol (TOPROL-XL) 100 MG 24 hr tablet   Oral   Take 100 mg by mouth daily.           . predniSONE (DELTASONE) 20 MG tablet   Oral   Take 3 tablets (60 mg total) by mouth daily.   15 tablet   0   . Topiramate (TOPAMAX PO)   Oral   Take by mouth.             BP 135/85  Pulse 70  Temp(Src) 97.8 F (36.6 C) (Oral)  Resp 18  Ht 5\' 5"  (1.651 m)  Wt 200 lb (90.719 kg)  BMI 33.28 kg/m2  SpO2 100% Physical Exam  Nursing note and vitals reviewed. Constitutional: She is oriented to person, place, and time. She appears well-developed and well-nourished. No distress.  HENT:  Head: Normocephalic and atraumatic.  Mouth/Throat: Oropharynx is clear and moist.  Eyes: Conjunctivae and EOM are normal. Pupils are equal, round, and reactive to light.  Neck: Normal range of motion. Neck supple.  Cardiovascular: Normal rate, regular rhythm and normal heart sounds.   No murmur heard. Pulmonary/Chest: Effort normal and breath sounds normal. No respiratory distress.  Abdominal: Soft. Bowel sounds are normal. There is no tenderness.  Musculoskeletal: Normal range of motion. She exhibits no edema.  Neurological: She is alert and oriented to person, place, and time. No cranial nerve deficit. She exhibits normal muscle tone. Coordination normal.  Skin: Skin is warm. No rash noted.    ED Course  Procedures (including critical care time) Labs Review Labs Reviewed - No data to display Imaging Review No results found.  EKG Interpretation   None       MDM   1. Migraine    Patient with typical migraine symptoms. Patient with improvement with migraine cocktail. Include Decadron Benadryl and Reglan IM. Patient's eye symptoms are more bilateral eye examination without any significant abnormalities of the eyes. Pupils are reactive anterior chamber is normal. No erythema to the sclera. Suspect patient's symptoms are typical migraine.    Shelda Jakes, MD 02/12/13 [phone]

## 2013-04-10 ENCOUNTER — Encounter (HOSPITAL_BASED_OUTPATIENT_CLINIC_OR_DEPARTMENT_OTHER): Payer: Self-pay | Admitting: Emergency Medicine

## 2013-04-10 ENCOUNTER — Emergency Department (HOSPITAL_BASED_OUTPATIENT_CLINIC_OR_DEPARTMENT_OTHER)
Admission: EM | Admit: 2013-04-10 | Discharge: 2013-04-10 | Disposition: A | Discharge status: Home / Self Care | Payer: No Typology Code available for payment source | Attending: Emergency Medicine | Admitting: Emergency Medicine

## 2013-04-10 DIAGNOSIS — I10 Essential (primary) hypertension: Secondary | ICD-10-CM | POA: Insufficient documentation

## 2013-04-10 DIAGNOSIS — I509 Heart failure, unspecified: Secondary | ICD-10-CM | POA: Insufficient documentation

## 2013-04-10 DIAGNOSIS — Z79899 Other long term (current) drug therapy: Secondary | ICD-10-CM | POA: Insufficient documentation

## 2013-04-10 DIAGNOSIS — IMO0002 Reserved for concepts with insufficient information to code with codable children: Secondary | ICD-10-CM | POA: Insufficient documentation

## 2013-04-10 DIAGNOSIS — Z791 Long term (current) use of non-steroidal anti-inflammatories (NSAID): Secondary | ICD-10-CM | POA: Insufficient documentation

## 2013-04-10 DIAGNOSIS — N644 Mastodynia: Secondary | ICD-10-CM

## 2013-04-10 DIAGNOSIS — J45909 Unspecified asthma, uncomplicated: Secondary | ICD-10-CM | POA: Insufficient documentation

## 2013-04-10 DIAGNOSIS — G43909 Migraine, unspecified, not intractable, without status migrainosus: Secondary | ICD-10-CM | POA: Insufficient documentation

## 2013-04-10 MED ORDER — MELOXICAM 7.5 MG PO TABS: 7.5000 mg | ORAL_TABLET | Freq: Every day | ORAL | Status: DC

## 2013-04-10 MED ORDER — HYDROCODONE-ACETAMINOPHEN 5-325 MG PO TABS
2.0000 | ORAL_TABLET | Freq: Once | ORAL | Status: AC
  Administered 2013-04-10: 2 via ORAL
  Filled 2013-04-10: qty 2

## 2013-04-10 MED ORDER — HYDROCODONE-ACETAMINOPHEN 5-325 MG PO TABS: 1.0000 | ORAL_TABLET | Freq: Four times a day (QID) | ORAL | Status: DC | PRN

## 2013-04-10 NOTE — ED Provider Notes (Signed)
CSN: [id number]     Arrival date & time 04/10/13  0007 History  This chart was scribed for Alf Doyle Alfonso Patten, MD by Rolanda Lundborg, ED Scribe. This patient was seen in room MH11/MH11 and the patient's care was started at 12:27 AM.    Chief Complaint  Patient presents with  . Breast Pain   The history is provided by the patient. No language interpreter was used.   HPI Comments: Kaho Selle is a 43 y.o. female who presents to the Emergency Department complaining of right breast pain inside the medial aspect of the breast onset yesterday around 5pm. She has not taken anything for the pain.  She denies rash, swelling. She is up to date on her mammograms. They found some abnormal tissue in her left breast that a surgeon at [facility] removed. She denies any heavy lifting.No trauma.  No cough.      Past Medical History  Diagnosis Date  . Asthma   . Blood transfusion   . CHF (congestive heart failure)   . Hypertension   . Cardiac arrhythmia   . Migraine    Past Surgical History  Procedure Laterality Date  . Abdominal hysterectomy    . Tonsillectomy     History reviewed. No pertinent family history. History  Substance Use Topics  . Smoking status: Never Smoker   . Smokeless tobacco: Not on file  . Alcohol Use: No   OB History   Grav Para Term Preterm Abortions TAB SAB Ect Mult Living                 Review of Systems  All other systems reviewed and are negative.    Allergies  Review of patient's allergies indicates no known allergies.  Home Medications   Current Outpatient Rx  Name  Route  Sig  Dispense  Refill  . albuterol (PROVENTIL HFA;VENTOLIN HFA) 108 (90 BASE) MCG/ACT inhaler   Inhalation   Inhale 2 puffs into the lungs every 6 (six) hours as needed. For shortness of breath and wheezing          . albuterol (PROVENTIL) (2.5 MG/3ML) 0.083% nebulizer solution   Nebulization   Take 3 mLs (2.5 mg total) by nebulization every 6 (six) hours as  needed.   75 mL   0   . HYDROcodone-acetaminophen (NORCO) 5-325 MG per tablet   Oral   Take 1 tablet by mouth every 4 (four) hours as needed for pain.   10 tablet   0   . HYDROcodone-acetaminophen (NORCO/VICODIN) 5-325 MG per tablet   Oral   Take 1-2 tablets by mouth every 6 (six) hours as needed for pain.   10 tablet   0   . HYDROcodone-acetaminophen (NORCO/VICODIN) 5-325 MG per tablet   Oral   Take 2 tablets by mouth every 4 (four) hours as needed for pain.   10 tablet   0   . ibuprofen (ADVIL,MOTRIN) 600 MG tablet   Oral   Take 1 tablet (600 mg total) by mouth every 6 (six) hours as needed for pain.   30 tablet   0   . ibuprofen (ADVIL,MOTRIN) 800 MG tablet   Oral   Take 1 tablet (800 mg total) by mouth 3 (three) times daily.   21 tablet   0   . metoCLOPramide (REGLAN) 10 MG tablet   Oral   Take 1 tablet (10 mg total) by mouth every 6 (six) hours as needed (nausea/headache).   6 tablet  0   . metoCLOPramide (REGLAN) 10 MG tablet   Oral   Take 1 tablet (10 mg total) by mouth every 6 (six) hours.   15 tablet   0   . metoprolol (TOPROL-XL) 100 MG 24 hr tablet   Oral   Take 100 mg by mouth daily.           . predniSONE (DELTASONE) 20 MG tablet   Oral   Take 3 tablets (60 mg total) by mouth daily.   15 tablet   0   . Topiramate (TOPAMAX PO)   Oral   Take by mouth.            BP 112/83  Pulse 87  Temp(Src) 98.7 F (37.1 C) (Oral)  Resp 18  Ht 5\' 5"  (1.651 m)  Wt 199 lb (90.266 kg)  BMI 33.12 kg/m2  SpO2 100% Physical Exam  Nursing note and vitals reviewed. Constitutional: She is oriented to person, place, and time. She appears well-developed and well-nourished. No distress.  HENT:  Head: Normocephalic and atraumatic.  Eyes: EOM are normal.  Neck: Normal range of motion. Neck supple. No tracheal deviation present.  No axillary or supraclavicular adenopathy  Cardiovascular: Normal rate and regular rhythm.   Pulmonary/Chest: Effort normal.  No respiratory distress. She has no wheezes. She has no rales. She exhibits no mass, no tenderness, no bony tenderness and no retraction. Right breast exhibits no inverted nipple, no mass, no nipple discharge and no skin change. Left breast exhibits no inverted nipple. Breasts are symmetrical.    Abdominal: Soft. Bowel sounds are normal.  Musculoskeletal: Normal range of motion.  No masses no retraction no discharge. No lymph nodes on axilla. Good radial pulses.  Lymphadenopathy:    She has no cervical adenopathy.       Right: No supraclavicular adenopathy present.       Left: No supraclavicular adenopathy present.  Neurological: She is alert and oriented to person, place, and time. She displays normal reflexes.  Skin: Skin is warm and dry.  Psychiatric: She has a normal mood and affect. Her behavior is normal.    ED Course  Procedures (including critical care time) Medications - No data to display  DIAGNOSTIC STUDIES: Oxygen Saturation is 100% on RA, normal by my interpretation.    COORDINATION OF CARE: 12:30 AM- Discussed treatment plan with pt. Pt agrees to plan.    Labs Review Labs Reviewed - No data to display Imaging Review No results found.  EKG Interpretation   None       MDM  No diagnosis found. Follow up with the breast center for imaging and ongoing care.  Patient verbalizes understanding and agrees to follow up  I personally performed the services described in this documentation, which was scribed in my presence. The recorded information has been reviewed and is accurate.    Carlisle Beers, MD 04/10/13 [phone]

## 2013-04-10 NOTE — Discharge Instructions (Signed)
Breast Tenderness Breast tenderness is a common problem for women of all ages. Breast tenderness may cause mild discomfort to severe pain. It has a variety of causes. Your health care provider will find out the likely cause of your breast tenderness by examining your breasts, asking you about symptoms, and ordering some tests. Breast tenderness usually does not mean you have breast cancer. HOME CARE INSTRUCTIONS  Breast tenderness often can be handled at home. You can try:  Getting fitted for a new bra that provides more support, especially during exercise.  Wearing a more supportive bra or sports bra while sleeping when your breasts are very tender.  If you have a breast injury, apply ice to the area:  Put ice in a plastic bag.  Place a towel between your skin and the bag.  Leave the ice on for 20 minutes, 2 3 times a day.  If your breasts are too full of milk as a result of breastfeeding, try:  Expressing milk either by hand or with a breast pump.  Applying a warm compress to the breasts for relief.  Taking over-the-counter pain relievers, if approved by your health care provider.  Taking other medicines that your health care provider prescribes. These may include antibiotic medicines or birth control pills. Over the long term, your breast tenderness might be eased if you:  Cut down on caffeine.  Reduce the amount of fat in your diet. Keep a log of the days and times when your breasts are most tender. This will help you and your health care provider find the cause of the tenderness and how to relieve it. Also, learn how to do breast exams at home. This will help you notice if you have an unusual growth or lump that could cause tenderness. SEEK MEDICAL CARE IF:   Any part of your breast is hard, red, and hot to the touch. This could be a sign of infection.  Fluid is coming out of your nipples (and you are not breastfeeding). Especially watch for blood or pus.  You have a fever  as well as breast tenderness.  You have a new or painful lump in your breast that remains after your menstrual period ends.  You have tried to take care of the pain at home, but it has not gone away.  Your breast pain is getting worse, or the pain is making it hard to do the things you usually do during your day. Document Released: 02/26/2008 Document Revised: 11/15/2012 Document Reviewed: 10/12/2012 [employer] Patient Information 2014 [employer].  

## 2013-04-10 NOTE — ED Notes (Signed)
Pt c/o right breast pain x 1 day

## 2013-07-09 ENCOUNTER — Encounter (HOSPITAL_BASED_OUTPATIENT_CLINIC_OR_DEPARTMENT_OTHER): Payer: Self-pay | Admitting: Emergency Medicine

## 2013-07-09 DIAGNOSIS — I10 Essential (primary) hypertension: Secondary | ICD-10-CM | POA: Insufficient documentation

## 2013-07-09 DIAGNOSIS — IMO0002 Reserved for concepts with insufficient information to code with codable children: Secondary | ICD-10-CM | POA: Insufficient documentation

## 2013-07-09 DIAGNOSIS — G43909 Migraine, unspecified, not intractable, without status migrainosus: Secondary | ICD-10-CM | POA: Insufficient documentation

## 2013-07-09 DIAGNOSIS — Z79899 Other long term (current) drug therapy: Secondary | ICD-10-CM | POA: Insufficient documentation

## 2013-07-09 DIAGNOSIS — I509 Heart failure, unspecified: Secondary | ICD-10-CM | POA: Insufficient documentation

## 2013-07-09 DIAGNOSIS — Z791 Long term (current) use of non-steroidal anti-inflammatories (NSAID): Secondary | ICD-10-CM | POA: Insufficient documentation

## 2013-07-09 DIAGNOSIS — J45909 Unspecified asthma, uncomplicated: Secondary | ICD-10-CM | POA: Insufficient documentation

## 2013-07-09 NOTE — ED Notes (Signed)
Headache  States hx of migraine.  Popping gum

## 2013-07-10 ENCOUNTER — Emergency Department (HOSPITAL_BASED_OUTPATIENT_CLINIC_OR_DEPARTMENT_OTHER)
Admission: EM | Admit: 2013-07-10 | Discharge: 2013-07-10 | Disposition: A | Discharge status: Home / Self Care | Payer: No Typology Code available for payment source | Attending: Emergency Medicine | Admitting: Emergency Medicine

## 2013-07-10 DIAGNOSIS — G43909 Migraine, unspecified, not intractable, without status migrainosus: Secondary | ICD-10-CM

## 2013-07-10 MED ORDER — DIPHENHYDRAMINE HCL 50 MG/ML IJ SOLN
25.0000 mg | Freq: Once | INTRAMUSCULAR | Status: DC
  Filled 2013-07-10: qty 1

## 2013-07-10 MED ORDER — METOCLOPRAMIDE HCL 5 MG/ML IJ SOLN
10.0000 mg | Freq: Once | INTRAMUSCULAR | Status: AC
  Administered 2013-07-10: 10 mg via INTRAMUSCULAR
  Filled 2013-07-10: qty 2

## 2013-07-10 MED ORDER — HYDROCODONE-ACETAMINOPHEN 5-325 MG PO TABS: 1.0000 | ORAL_TABLET | Freq: Four times a day (QID) | ORAL | Status: DC | PRN

## 2013-07-10 MED ORDER — METOCLOPRAMIDE HCL 10 MG PO TABS: 10.0000 mg | ORAL_TABLET | Freq: Four times a day (QID) | ORAL | Status: DC | PRN

## 2013-07-10 MED ORDER — KETOROLAC TROMETHAMINE 60 MG/2ML IM SOLN
60.0000 mg | Freq: Once | INTRAMUSCULAR | Status: DC
  Filled 2013-07-10: qty 2

## 2013-07-10 MED ORDER — KETOROLAC TROMETHAMINE 60 MG/2ML IM SOLN
60.0000 mg | Freq: Once | INTRAMUSCULAR | Status: AC
  Administered 2013-07-10: 60 mg via INTRAMUSCULAR

## 2013-07-10 MED ORDER — DIPHENHYDRAMINE HCL 50 MG/ML IJ SOLN
25.0000 mg | Freq: Once | INTRAMUSCULAR | Status: AC
  Administered 2013-07-10: 25 mg via INTRAMUSCULAR

## 2013-07-10 NOTE — Discharge Instructions (Signed)
Migraine Headache A migraine headache is an intense, throbbing pain on one or both sides of your head. A migraine can last for 30 minutes to several hours. CAUSES  The exact cause of a migraine headache is not always known. However, a migraine may be caused when nerves in the brain become irritated and release chemicals that cause inflammation. This causes pain. Certain things may also trigger migraines, such as:  Alcohol.  Smoking.  Stress.  Menstruation.  Aged cheeses.  Foods or drinks that contain nitrates, glutamate, aspartame, or tyramine.  Lack of sleep.  Chocolate.  Caffeine.  Hunger.  Physical exertion.  Fatigue.  Medicines used to treat chest pain (nitroglycerine), birth control pills, estrogen, and some blood pressure medicines. SIGNS AND SYMPTOMS  Pain on one or both sides of your head.  Pulsating or throbbing pain.  Severe pain that prevents daily activities.  Pain that is aggravated by any physical activity.  Nausea, vomiting, or both.  Dizziness.  Pain with exposure to bright lights, loud noises, or activity.  General sensitivity to bright lights, loud noises, or smells. Before you get a migraine, you may get warning signs that a migraine is coming (aura). An aura may include:  Seeing flashing lights.  Seeing bright spots, halos, or zig-zag lines.  Having tunnel vision or blurred vision.  Having feelings of numbness or tingling.  Having trouble talking.  Having muscle weakness. DIAGNOSIS  A migraine headache is often diagnosed based on:  Symptoms.  Physical exam.  A CT scan or MRI of your head. These imaging tests cannot diagnose migraines, but they can help rule out other causes of headaches. TREATMENT Medicines may be given for pain and nausea. Medicines can also be given to help prevent recurrent migraines.  HOME CARE INSTRUCTIONS  Only take over-the-counter or prescription medicines for pain or discomfort as directed by your  health care provider. The use of long-term narcotics is not recommended.  Lie down in a dark, quiet room when you have a migraine.  Keep a journal to find out what may trigger your migraine headaches. For example, write down:  What you eat and drink.  How much sleep you get.  Any change to your diet or medicines.  Limit alcohol consumption.  Quit smoking if you smoke.  Get 7 9 hours of sleep, or as recommended by your health care provider.  Limit stress.  Keep lights dim if bright lights bother you and make your migraines worse. SEEK IMMEDIATE MEDICAL CARE IF:   Your migraine becomes severe.  You have a fever.  You have a stiff neck.  You have vision loss.  You have muscular weakness or loss of muscle control.  You start losing your balance or have trouble walking.  You feel faint or pass out.  You have severe symptoms that are different from your first symptoms. MAKE SURE YOU:   Understand these instructions.  Will watch your condition.  Will get help right away if you are not doing well or get worse. Document Released: 03/15/2005 Document Revised: 01/03/2013 Document Reviewed: 11/20/2012 [employer] Patient Information 2014 [employer].  

## 2013-07-10 NOTE — ED Provider Notes (Signed)
CSN: [id number]     Arrival date & time 07/09/13  2324 History   First MD Initiated Contact with Patient 07/10/13 0232     Chief Complaint  Patient presents with  . Migraine     (Consider location/radiation/quality/duration/timing/severity/associated sxs/prior Treatment) HPI This is a 43 year old female with a history of migraines. She is here with a typical migraine headache that began yesterday evening about 7 PM. She describes it as "real bad". There is no no nausea or vomiting. She denies focal neurologic deficit. Light makes it worse. She has had watering of her eyes.  Past Medical History  Diagnosis Date  . Asthma   . Blood transfusion   . CHF (congestive heart failure)   . Hypertension   . Cardiac arrhythmia   . Migraine    Past Surgical History  Procedure Laterality Date  . Abdominal hysterectomy    . Tonsillectomy    . Orthopedic surgery     No family history on file. History  Substance Use Topics  . Smoking status: Never Smoker   . Smokeless tobacco: Not on file  . Alcohol Use: No   OB History   Grav Para Term Preterm Abortions TAB SAB Ect Mult Living                 Review of Systems  All other systems reviewed and are negative.   Allergies  Review of patient's allergies indicates no known allergies.  Home Medications   Current Outpatient Rx  Name  Route  Sig  Dispense  Refill  . albuterol (PROVENTIL HFA;VENTOLIN HFA) 108 (90 BASE) MCG/ACT inhaler   Inhalation   Inhale 2 puffs into the lungs every 6 (six) hours as needed. For shortness of breath and wheezing          . albuterol (PROVENTIL) (2.5 MG/3ML) 0.083% nebulizer solution   Nebulization   Take 3 mLs (2.5 mg total) by nebulization every 6 (six) hours as needed.   75 mL   0   . HYDROcodone-acetaminophen (NORCO) 5-325 MG per tablet   Oral   Take 1 tablet by mouth every 4 (four) hours as needed for pain.   10 tablet   0   . HYDROcodone-acetaminophen (NORCO/VICODIN) 5-325 MG per  tablet   Oral   Take 1-2 tablets by mouth every 6 (six) hours as needed for pain.   10 tablet   0   . HYDROcodone-acetaminophen (NORCO/VICODIN) 5-325 MG per tablet   Oral   Take 2 tablets by mouth every 4 (four) hours as needed for pain.   10 tablet   0   . HYDROcodone-acetaminophen (NORCO/VICODIN) 5-325 MG per tablet   Oral   Take 1 tablet by mouth every 6 (six) hours as needed.   6 tablet   0   . ibuprofen (ADVIL,MOTRIN) 600 MG tablet   Oral   Take 1 tablet (600 mg total) by mouth every 6 (six) hours as needed for pain.   30 tablet   0   . ibuprofen (ADVIL,MOTRIN) 800 MG tablet   Oral   Take 1 tablet (800 mg total) by mouth 3 (three) times daily.   21 tablet   0   . meloxicam (MOBIC) 7.5 MG tablet   Oral   Take 1 tablet (7.5 mg total) by mouth daily.   7 tablet   0   . metoCLOPramide (REGLAN) 10 MG tablet   Oral   Take 1 tablet (10 mg total) by mouth every 6 (six) hours  as needed (nausea/headache).   6 tablet   0   . metoCLOPramide (REGLAN) 10 MG tablet   Oral   Take 1 tablet (10 mg total) by mouth every 6 (six) hours.   15 tablet   0   . metoprolol (TOPROL-XL) 100 MG 24 hr tablet   Oral   Take 100 mg by mouth daily.           . predniSONE (DELTASONE) 20 MG tablet   Oral   Take 3 tablets (60 mg total) by mouth daily.   15 tablet   0   . Topiramate (TOPAMAX PO)   Oral   Take by mouth.            BP 119/100  Pulse 80  Temp(Src) 98.7 F (37.1 C) (Oral)  Resp 20  SpO2 100%  Physical Exam General: Well-developed, well-nourished female in no acute distress; appearance consistent with age of record HENT: normocephalic; atraumatic Eyes: pupils equal, round and reactive to light; extraocular muscles intact; photophobia Neck: supple Heart: regular rate and rhythm Lungs: clear to auscultation bilaterally Abdomen: soft; nondistended; nontender; bowel sounds present Extremities: No deformity; full range of motion; pulses normal; well healed skin  graft scars of lower legs Neurologic: Awake, alert and oriented; motor function intact in all extremities and symmetric; no facial droop Skin: Warm and dry Psychiatric: Normal mood and affect    ED Course  Procedures (including critical care time)  MDM  3:30 AM Headache improved after IM injections.    Wynetta Fines, MD 07/10/13 0330

## 2013-07-29 ENCOUNTER — Encounter (HOSPITAL_BASED_OUTPATIENT_CLINIC_OR_DEPARTMENT_OTHER): Payer: Self-pay | Admitting: Emergency Medicine

## 2013-07-29 ENCOUNTER — Emergency Department (HOSPITAL_BASED_OUTPATIENT_CLINIC_OR_DEPARTMENT_OTHER)
Admission: EM | Admit: 2013-07-29 | Discharge: 2013-07-30 | Disposition: A | Discharge status: Home / Self Care | Payer: No Typology Code available for payment source | Attending: Emergency Medicine | Admitting: Emergency Medicine

## 2013-07-29 DIAGNOSIS — I509 Heart failure, unspecified: Secondary | ICD-10-CM | POA: Insufficient documentation

## 2013-07-29 DIAGNOSIS — Y92410 Unspecified street and highway as the place of occurrence of the external cause: Secondary | ICD-10-CM | POA: Insufficient documentation

## 2013-07-29 DIAGNOSIS — J45909 Unspecified asthma, uncomplicated: Secondary | ICD-10-CM | POA: Insufficient documentation

## 2013-07-29 DIAGNOSIS — Z79899 Other long term (current) drug therapy: Secondary | ICD-10-CM | POA: Insufficient documentation

## 2013-07-29 DIAGNOSIS — Z791 Long term (current) use of non-steroidal anti-inflammatories (NSAID): Secondary | ICD-10-CM | POA: Insufficient documentation

## 2013-07-29 DIAGNOSIS — G43909 Migraine, unspecified, not intractable, without status migrainosus: Secondary | ICD-10-CM | POA: Insufficient documentation

## 2013-07-29 DIAGNOSIS — S0993XA Unspecified injury of face, initial encounter: Secondary | ICD-10-CM | POA: Insufficient documentation

## 2013-07-29 DIAGNOSIS — T148XXA Other injury of unspecified body region, initial encounter: Secondary | ICD-10-CM

## 2013-07-29 DIAGNOSIS — I10 Essential (primary) hypertension: Secondary | ICD-10-CM | POA: Insufficient documentation

## 2013-07-29 DIAGNOSIS — S199XXA Unspecified injury of neck, initial encounter: Secondary | ICD-10-CM

## 2013-07-29 DIAGNOSIS — S40029A Contusion of unspecified upper arm, initial encounter: Secondary | ICD-10-CM | POA: Insufficient documentation

## 2013-07-29 DIAGNOSIS — IMO0002 Reserved for concepts with insufficient information to code with codable children: Secondary | ICD-10-CM | POA: Insufficient documentation

## 2013-07-29 DIAGNOSIS — Y9389 Activity, other specified: Secondary | ICD-10-CM | POA: Insufficient documentation

## 2013-07-29 HISTORY — DX: Encounter for other specified aftercare: Z51.89
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
## 2013-07-30 ENCOUNTER — Encounter (HOSPITAL_BASED_OUTPATIENT_CLINIC_OR_DEPARTMENT_OTHER): Payer: Self-pay | Admitting: Emergency Medicine

## 2013-07-30 ENCOUNTER — Emergency Department (HOSPITAL_BASED_OUTPATIENT_CLINIC_OR_DEPARTMENT_OTHER): Payer: No Typology Code available for payment source

## 2013-07-30 MED ORDER — METHOCARBAMOL 500 MG PO TABS: 500.0000 mg | ORAL_TABLET | Freq: Two times a day (BID) | ORAL | Status: DC

## 2013-07-30 MED ORDER — HYDROCODONE-ACETAMINOPHEN 5-325 MG PO TABS: 1.0000 | ORAL_TABLET | ORAL | Status: DC | PRN

## 2013-07-30 MED ORDER — METHOCARBAMOL 500 MG PO TABS
1000.0000 mg | ORAL_TABLET | Freq: Once | ORAL | Status: AC
  Administered 2013-07-30: 1000 mg via ORAL
  Filled 2013-07-30: qty 2

## 2013-07-30 MED ORDER — NAPROXEN 250 MG PO TABS
500.0000 mg | ORAL_TABLET | Freq: Once | ORAL | Status: AC
  Administered 2013-07-30: 500 mg via ORAL
  Filled 2013-07-30: qty 2

## 2013-10-10 ENCOUNTER — Emergency Department (HOSPITAL_BASED_OUTPATIENT_CLINIC_OR_DEPARTMENT_OTHER)
Admission: EM | Admit: 2013-10-10 | Discharge: 2013-10-10 | Disposition: A | Discharge status: Home / Self Care | Payer: Medicaid Other | Attending: Emergency Medicine | Admitting: Emergency Medicine

## 2013-10-10 ENCOUNTER — Encounter (HOSPITAL_BASED_OUTPATIENT_CLINIC_OR_DEPARTMENT_OTHER): Payer: Self-pay | Admitting: Emergency Medicine

## 2013-10-10 DIAGNOSIS — I10 Essential (primary) hypertension: Secondary | ICD-10-CM | POA: Insufficient documentation

## 2013-10-10 DIAGNOSIS — I509 Heart failure, unspecified: Secondary | ICD-10-CM | POA: Insufficient documentation

## 2013-10-10 DIAGNOSIS — J040 Acute laryngitis: Secondary | ICD-10-CM | POA: Diagnosis not present

## 2013-10-10 DIAGNOSIS — G43909 Migraine, unspecified, not intractable, without status migrainosus: Secondary | ICD-10-CM | POA: Diagnosis not present

## 2013-10-10 DIAGNOSIS — IMO0002 Reserved for concepts with insufficient information to code with codable children: Secondary | ICD-10-CM | POA: Diagnosis not present

## 2013-10-10 DIAGNOSIS — J029 Acute pharyngitis, unspecified: Secondary | ICD-10-CM

## 2013-10-10 DIAGNOSIS — Z79899 Other long term (current) drug therapy: Secondary | ICD-10-CM | POA: Insufficient documentation

## 2013-10-10 DIAGNOSIS — J45909 Unspecified asthma, uncomplicated: Secondary | ICD-10-CM | POA: Diagnosis not present

## 2013-10-10 LAB — RAPID STREP SCREEN (MED CTR MEBANE ONLY): Streptococcus, Group A Screen (Direct): NEGATIVE

## 2013-10-10 MED ORDER — PREDNISONE 10 MG PO TABS: 20.0000 mg | ORAL_TABLET | Freq: Two times a day (BID) | ORAL | Status: DC

## 2013-10-10 MED ORDER — PREDNISONE 20 MG PO TABS
20.0000 mg | ORAL_TABLET | Freq: Once | ORAL | Status: AC
  Administered 2013-10-10: 20 mg via ORAL
  Filled 2013-10-10: qty 1

## 2013-10-10 NOTE — ED Notes (Signed)
Patient states that she started to have sore throat about 2 days ago.

## 2013-10-10 NOTE — ED Provider Notes (Addendum)
CSN: [id number]     Arrival date & time 10/10/13  0043 History   First MD Initiated Contact with Patient 10/10/13 0059     Chief Complaint  Patient presents with  . Sore Throat     (Consider location/radiation/quality/duration/timing/severity/associated sxs/prior Treatment) HPI Comments: Patient presents with a two-day history of sore throat that is worse with swallowing and talking. She denies any fevers or chills. She denies any ill contacts. She denies any productive cough.  Patient is a 43 y.o. female presenting with pharyngitis. The history is provided by the patient.  Sore Throat This is a new problem. The current episode started 2 days ago. The problem occurs constantly. The problem has been gradually worsening. Pertinent negatives include no chest pain and no shortness of breath. The symptoms are aggravated by swallowing (Talking). Nothing relieves the symptoms. She has tried nothing for the symptoms. The treatment provided no relief.    Past Medical History  Diagnosis Date  . Asthma   . Blood transfusion   . CHF (congestive heart failure)   . Hypertension   . Cardiac arrhythmia   . Migraine   . Blood transfusion without reported diagnosis    Past Surgical History  Procedure Laterality Date  . Abdominal hysterectomy    . Tonsillectomy    . Orthopedic surgery     History reviewed. No pertinent family history. History  Substance Use Topics  . Smoking status: Never Smoker   . Smokeless tobacco: Never Used  . Alcohol Use: No   OB History   Grav Para Term Preterm Abortions TAB SAB Ect Mult Living                 Review of Systems  Respiratory: Negative for shortness of breath.   Cardiovascular: Negative for chest pain.  All other systems reviewed and are negative.     Allergies  Review of patient's allergies indicates no known allergies.  Home Medications   Prior to Admission medications   Medication Sig Start Date End Date Taking? Authorizing Provider   albuterol (PROVENTIL HFA;VENTOLIN HFA) 108 (90 BASE) MCG/ACT inhaler Inhale 2 puffs into the lungs every 6 (six) hours as needed. For shortness of breath and wheezing     Historical Provider, MD  albuterol (PROVENTIL) (2.5 MG/3ML) 0.083% nebulizer solution Take 3 mLs (2.5 mg total) by nebulization every 6 (six) hours as needed. 01/18/13   Dorie Rank, MD  HYDROcodone-acetaminophen (NORCO) 5-325 MG per tablet Take 1 tablet by mouth every 4 (four) hours as needed for pain. 12/07/12   Julianne Rice, MD  HYDROcodone-acetaminophen (NORCO) 5-325 MG per tablet Take 1 tablet by mouth every 4 (four) hours as needed. 07/30/13   April K Palumbo-Rasch, MD  HYDROcodone-acetaminophen (NORCO/VICODIN) 5-325 MG per tablet Take 1-2 tablets by mouth every 6 (six) hours as needed for pain. 07/10/12   Charles B. Karle Starch, MD  HYDROcodone-acetaminophen (NORCO/VICODIN) 5-325 MG per tablet Take 2 tablets by mouth every 4 (four) hours as needed for pain. 01/02/13   Malvin Johns, MD  HYDROcodone-acetaminophen (NORCO/VICODIN) 5-325 MG per tablet Take 1 tablet by mouth every 6 (six) hours as needed. 04/10/13   April Alfonso Patten, MD  HYDROcodone-acetaminophen (NORCO/VICODIN) 5-325 MG per tablet Take 1-2 tablets by mouth every 6 (six) hours as needed for moderate pain. 07/10/13   Karen Chafe Molpus, MD  ibuprofen (ADVIL,MOTRIN) 600 MG tablet Take 1 tablet (600 mg total) by mouth every 6 (six) hours as needed for pain. 12/07/12   Julianne Rice, MD  ibuprofen (ADVIL,MOTRIN) 800 MG tablet Take 1 tablet (800 mg total) by mouth 3 (three) times daily. 10/17/12   Jennifer L Piepenbrink, PA-C  meloxicam (MOBIC) 7.5 MG tablet Take 1 tablet (7.5 mg total) by mouth daily. 04/10/13   April K Palumbo-Rasch, MD  methocarbamol (ROBAXIN) 500 MG tablet Take 1 tablet (500 mg total) by mouth 2 (two) times daily. 07/30/13   April K Palumbo-Rasch, MD  metoCLOPramide (REGLAN) 10 MG tablet Take 1 tablet (10 mg total) by mouth every 6 (six) hours as needed  (nausea/headache). 12/07/12   Julianne Rice, MD  metoCLOPramide (REGLAN) 10 MG tablet Take 1 tablet (10 mg total) by mouth every 6 (six) hours. 01/02/13   Malvin Johns, MD  metoCLOPramide (REGLAN) 10 MG tablet Take 1 tablet (10 mg total) by mouth every 6 (six) hours as needed (for nausea or headache). 07/10/13   Karen Chafe Molpus, MD  metoprolol (TOPROL-XL) 100 MG 24 hr tablet Take 100 mg by mouth daily.      Historical Provider, MD  predniSONE (DELTASONE) 20 MG tablet Take 3 tablets (60 mg total) by mouth daily. 01/18/13   Dorie Rank, MD  Topiramate (TOPAMAX PO) Take by mouth.      Historical Provider, MD   BP 117/83  Pulse 88  Temp(Src) 98.5 F (36.9 C) (Oral)  Resp 16  SpO2 99% Physical Exam  Nursing note and vitals reviewed. Constitutional: She is oriented to person, place, and time. She appears well-developed and well-nourished. No distress.  HENT:  Head: Normocephalic and atraumatic.  The posterior oropharynx shows only mild erythema. There is no significant swelling or exudates.  Neck: Normal range of motion. Neck supple.  Cardiovascular: Normal rate and regular rhythm.  Exam reveals no gallop and no friction rub.   No murmur heard. Pulmonary/Chest: Effort normal and breath sounds normal. No respiratory distress. She has no wheezes.  Voice is somewhat hoarse.  Abdominal: Soft. Bowel sounds are normal. She exhibits no distension. There is no tenderness.  Musculoskeletal: Normal range of motion.  Neurological: She is alert and oriented to person, place, and time.  Skin: Skin is warm and dry. She is not diaphoretic.    ED Course  Procedures (including critical care time) Labs Review Labs Reviewed  RAPID STREP SCREEN    Imaging Review No results found.   EKG Interpretation None      MDM   Final diagnoses:  None    Strep test is negative and there is no evidence for peritonsillar or retropharyngeal abscess. There is no drooling. I suspect a viral etiology. We'll treat  with prednisone and when necessary followup.    Veryl Speak, MD 10/10/13 [id number]  Veryl Speak, MD 10/10/13 [phone]

## 2013-10-10 NOTE — Discharge Instructions (Signed)
Prednisone 20 mg twice daily for the next 3 days.  Ibuprofen 600 mg every 6 hours as needed for pain.  Return to the emergency department for difficulty breathing, an inability to swallow, or other new and concerning symptoms.   Laryngitis At the top of your windpipe is your voice box. It is the source of your voice. Inside your voice box are 2 bands of muscles called vocal cords. When you breathe, your vocal cords are relaxed and open so that air can get into the lungs. When you decide to say something, these cords come together and vibrate. The sound from these vibrations goes into your throat and comes out through your mouth as sound. Laryngitis is an inflammation of the vocal cords that causes hoarseness, cough, loss of voice, sore throat, and dry throat. Laryngitis can be temporary (acute) or long-term (chronic). Most cases of acute laryngitis improve with time.Chronic laryngitis lasts for more than 3 weeks. CAUSES Laryngitis can often be related to excessive smoking, talking, or yelling, as well as inhalation of toxic fumes and allergies. Acute laryngitis is usually caused by a viral infection, vocal strain, measles or mumps, or bacterial infections. Chronic laryngitis is usually caused by vocal cord strain, vocal cord injury, postnasal drip, growths on the vocal cords, or acid reflux. SYMPTOMS   Cough.  Sore throat.  Dry throat. RISK FACTORS  Respiratory infections.  Exposure to irritating substances, such as cigarette smoke, excessive amounts of alcohol, stomach acids, and workplace chemicals.  Voice trauma, such as vocal cord injury from shouting or speaking too loud. DIAGNOSIS  Your cargiver will perform a physical exam. During the physical exam, your caregiver will examine your throat. The most common sign of laryngitis is hoarseness. Laryngoscopy may be necessary to confirm the diagnosis of this condition. This procedure allows your caregiver to look into the larynx. HOME  CARE INSTRUCTIONS  Drink enough fluids to keep your urine clear or pale yellow.  Rest until you no longer have symptoms or as directed by your caregiver.  Breathe in moist air.  Take all medicine as directed by your caregiver.  Do not smoke.  Talk as little as possible (this includes whispering).  Write on paper instead of talking until your voice is back to normal.  Follow up with your caregiver if your condition has not improved after 10 days. SEEK MEDICAL CARE IF:   You have trouble breathing.  You cough up blood.  You have persistent fever.  You have increasing pain.  You have difficulty swallowing. MAKE SURE YOU:  Understand these instructions.  Will watch your condition.  Will get help right away if you are not doing well or get worse. Document Released: 03/15/2005 Document Revised: 06/07/2011 Document Reviewed: 05/21/2010 [facility] Patient Information 2015 [place], [place]. This information is not intended to replace advice given to you by your health care provider. Make sure you discuss any questions you have with your health care provider.

## 2013-10-12 LAB — CULTURE, GROUP A STREP

## 2013-10-23 ENCOUNTER — Emergency Department (HOSPITAL_BASED_OUTPATIENT_CLINIC_OR_DEPARTMENT_OTHER)
Admission: EM | Admit: 2013-10-23 | Discharge: 2013-10-23 | Disposition: A | Discharge status: Other Acute Inpatient Hospital | Payer: Medicaid Other | Attending: Emergency Medicine | Admitting: Emergency Medicine

## 2013-10-23 ENCOUNTER — Encounter (HOSPITAL_BASED_OUTPATIENT_CLINIC_OR_DEPARTMENT_OTHER): Payer: Self-pay | Admitting: Emergency Medicine

## 2013-10-23 ENCOUNTER — Emergency Department (HOSPITAL_BASED_OUTPATIENT_CLINIC_OR_DEPARTMENT_OTHER): Payer: Medicaid Other

## 2013-10-23 DIAGNOSIS — IMO0002 Reserved for concepts with insufficient information to code with codable children: Secondary | ICD-10-CM | POA: Diagnosis not present

## 2013-10-23 DIAGNOSIS — I509 Heart failure, unspecified: Secondary | ICD-10-CM | POA: Diagnosis not present

## 2013-10-23 DIAGNOSIS — Z79899 Other long term (current) drug therapy: Secondary | ICD-10-CM | POA: Diagnosis not present

## 2013-10-23 DIAGNOSIS — J45909 Unspecified asthma, uncomplicated: Secondary | ICD-10-CM | POA: Diagnosis not present

## 2013-10-23 DIAGNOSIS — I10 Essential (primary) hypertension: Secondary | ICD-10-CM | POA: Diagnosis not present

## 2013-10-23 DIAGNOSIS — Z3202 Encounter for pregnancy test, result negative: Secondary | ICD-10-CM | POA: Insufficient documentation

## 2013-10-23 DIAGNOSIS — G458 Other transient cerebral ischemic attacks and related syndromes: Secondary | ICD-10-CM | POA: Diagnosis not present

## 2013-10-23 DIAGNOSIS — G43909 Migraine, unspecified, not intractable, without status migrainosus: Secondary | ICD-10-CM | POA: Diagnosis not present

## 2013-10-23 DIAGNOSIS — Z791 Long term (current) use of non-steroidal anti-inflammatories (NSAID): Secondary | ICD-10-CM | POA: Insufficient documentation

## 2013-10-23 DIAGNOSIS — R55 Syncope and collapse: Secondary | ICD-10-CM | POA: Insufficient documentation

## 2013-10-23 LAB — URINALYSIS, ROUTINE W REFLEX MICROSCOPIC
Bilirubin Urine: NEGATIVE
Glucose, UA: NEGATIVE mg/dL
Hgb urine dipstick: NEGATIVE
Ketones, ur: NEGATIVE mg/dL
Leukocytes, UA: NEGATIVE
Nitrite: NEGATIVE
Protein, ur: NEGATIVE mg/dL
Specific Gravity, Urine: 1.01 (ref 1.005–1.030)
Urobilinogen, UA: 0.2 mg/dL (ref 0.0–1.0)
pH: 6 (ref 5.0–8.0)

## 2013-10-23 LAB — COMPREHENSIVE METABOLIC PANEL WITH GFR
ALT: 17 U/L (ref 0–35)
AST: 20 U/L (ref 0–37)
Albumin: 3.9 g/dL (ref 3.5–5.2)
Alkaline Phosphatase: 73 U/L (ref 39–117)
Anion gap: 14 (ref 5–15)
BUN: 15 mg/dL (ref 6–23)
CO2: 21 meq/L (ref 19–32)
Calcium: 9.1 mg/dL (ref 8.4–10.5)
Chloride: 105 meq/L (ref 96–112)
Creatinine, Ser: 0.8 mg/dL (ref 0.50–1.10)
GFR calc Af Amer: >90 mL/min
GFR calc non Af Amer: 88 mL/min — ABNORMAL LOW
Glucose, Bld: 86 mg/dL (ref 70–99)
Potassium: 4.1 meq/L (ref 3.7–5.3)
Sodium: 140 meq/L (ref 137–147)
Total Bilirubin: 0.6 mg/dL (ref 0.3–1.2)
Total Protein: 7.2 g/dL (ref 6.0–8.3)

## 2013-10-23 LAB — DIFFERENTIAL
BASOS PCT: 0 % (ref 0–1)
Basophils Absolute: 0 10*3/uL (ref 0.0–0.1)
Eosinophils Absolute: 0.1 10*3/uL (ref 0.0–0.7)
Eosinophils Relative: 1 % (ref 0–5)
LYMPHS ABS: 2.6 10*3/uL (ref 0.7–4.0)
Lymphocytes Relative: 49 % — ABNORMAL HIGH (ref 12–46)
MONOS PCT: 10 % (ref 3–12)
Monocytes Absolute: 0.5 10*3/uL (ref 0.1–1.0)
NEUTROS ABS: 2.1 10*3/uL (ref 1.7–7.7)
Neutrophils Relative %: 39 % — ABNORMAL LOW (ref 43–77)

## 2013-10-23 LAB — RAPID URINE DRUG SCREEN, HOSP PERFORMED
Amphetamines: NOT DETECTED
Barbiturates: NOT DETECTED
Benzodiazepines: NOT DETECTED
Cocaine: NOT DETECTED
Opiates: NOT DETECTED
Tetrahydrocannabinol: NOT DETECTED

## 2013-10-23 LAB — CBC
HCT: 37.4 % (ref 36.0–46.0)
Hemoglobin: 12.8 g/dL (ref 12.0–15.0)
MCH: 31.6 pg (ref 26.0–34.0)
MCHC: 34.2 g/dL (ref 30.0–36.0)
MCV: 92.3 fL (ref 78.0–100.0)
PLATELETS: 329 10*3/uL (ref 150–400)
RBC: 4.05 MIL/uL (ref 3.87–5.11)
RDW: 13.3 % (ref 11.5–15.5)
WBC: 5.4 10*3/uL (ref 4.0–10.5)

## 2013-10-23 LAB — PROTIME-INR
INR: 1.01 (ref 0.00–1.49)
Prothrombin Time: 13.3 s (ref 11.6–15.2)

## 2013-10-23 LAB — APTT: aPTT: 30 s (ref 24–37)

## 2013-10-23 LAB — PREGNANCY, URINE: Preg Test, Ur: NEGATIVE

## 2013-10-23 LAB — ETHANOL: Alcohol, Ethyl (B): <11 mg/dL (ref 0–11)

## 2013-10-23 NOTE — ED Notes (Signed)
Pt reports nearly passing out going into clients home this morning. Sts that she was having palpitations earlier this morning. Sts she felt "weird" yesterday with dizziness and HA. Reports history of migraines.

## 2013-10-23 NOTE — ED Notes (Signed)
At bedside to do EKG and orthostatic vitals patient on cell phone.

## 2013-10-23 NOTE — ED Notes (Signed)
Pt requesting note to be faxed to attorney for court date.

## 2013-10-23 NOTE — ED Notes (Signed)
Report called to Hospital doctor at [facility].

## 2013-10-23 NOTE — ED Provider Notes (Signed)
CSN: [id number]     Arrival date & time 10/23/13  0831 History   First MD Initiated Contact with Patient 10/23/13 [phone]     Chief Complaint  Patient presents with  . Near Syncope     (Consider location/radiation/quality/duration/timing/severity/associated sxs/prior Treatment) HPI Pt presenting with c/o near syncope as well as intermittent sensation of right arm weakness and tingling.  Pt states that symptoms began last night and continued this morning after she arrived at a home health care client's house.  She describes feeling as if a migraine was going to start, but never developed headache.  No fever/chills, no vomiting.  No chest pain.  She did feel that her heart rate was rapid yesterday.  She states the weakness in the right arm is intermittent and lasts for several minutes to hours.  Currently she feels tingling in her right arm but no weakness.  There are no other associated systemic symptoms, there are no other alleviating or modifying factors.  No changes in vision or speech.   Past Medical History  Diagnosis Date  . Asthma   . Blood transfusion   . CHF (congestive heart failure)   . Hypertension   . Cardiac arrhythmia   . Migraine   . Blood transfusion without reported diagnosis    Past Surgical History  Procedure Laterality Date  . Abdominal hysterectomy    . Tonsillectomy    . Orthopedic surgery     No family history on file. History  Substance Use Topics  . Smoking status: Never Smoker   . Smokeless tobacco: Never Used  . Alcohol Use: No   OB History   Grav Para Term Preterm Abortions TAB SAB Ect Mult Living                 Review of Systems ROS reviewed and all otherwise negative except for mentioned in HPI    Allergies  Review of patient's allergies indicates no known allergies.  Home Medications   Prior to Admission medications   Medication Sig Start Date End Date Taking? Authorizing Provider  albuterol (PROVENTIL HFA;VENTOLIN HFA) 108 (90 BASE)  MCG/ACT inhaler Inhale 2 puffs into the lungs every 6 (six) hours as needed. For shortness of breath and wheezing     Historical Provider, MD  albuterol (PROVENTIL) (2.5 MG/3ML) 0.083% nebulizer solution Take 3 mLs (2.5 mg total) by nebulization every 6 (six) hours as needed. 01/18/13   Dorie Rank, MD  HYDROcodone-acetaminophen (NORCO) 5-325 MG per tablet Take 1 tablet by mouth every 4 (four) hours as needed for pain. 12/07/12   Julianne Rice, MD  HYDROcodone-acetaminophen (NORCO) 5-325 MG per tablet Take 1 tablet by mouth every 4 (four) hours as needed. 07/30/13   April K Palumbo-Rasch, MD  HYDROcodone-acetaminophen (NORCO/VICODIN) 5-325 MG per tablet Take 1-2 tablets by mouth every 6 (six) hours as needed for pain. 07/10/12   Charles B. Karle Starch, MD  HYDROcodone-acetaminophen (NORCO/VICODIN) 5-325 MG per tablet Take 2 tablets by mouth every 4 (four) hours as needed for pain. 01/02/13   Malvin Johns, MD  HYDROcodone-acetaminophen (NORCO/VICODIN) 5-325 MG per tablet Take 1 tablet by mouth every 6 (six) hours as needed. 04/10/13   April Alfonso Patten, MD  HYDROcodone-acetaminophen (NORCO/VICODIN) 5-325 MG per tablet Take 1-2 tablets by mouth every 6 (six) hours as needed for moderate pain. 07/10/13   Karen Chafe Molpus, MD  ibuprofen (ADVIL,MOTRIN) 600 MG tablet Take 1 tablet (600 mg total) by mouth every 6 (six) hours as needed for pain. 12/07/12  Julianne Rice, MD  ibuprofen (ADVIL,MOTRIN) 800 MG tablet Take 1 tablet (800 mg total) by mouth 3 (three) times daily. 10/17/12   Jennifer L Piepenbrink, PA-C  meloxicam (MOBIC) 7.5 MG tablet Take 1 tablet (7.5 mg total) by mouth daily. 04/10/13   April K Palumbo-Rasch, MD  methocarbamol (ROBAXIN) 500 MG tablet Take 1 tablet (500 mg total) by mouth 2 (two) times daily. 07/30/13   April K Palumbo-Rasch, MD  metoCLOPramide (REGLAN) 10 MG tablet Take 1 tablet (10 mg total) by mouth every 6 (six) hours as needed (nausea/headache). 12/07/12   Julianne Rice, MD   metoCLOPramide (REGLAN) 10 MG tablet Take 1 tablet (10 mg total) by mouth every 6 (six) hours. 01/02/13   Malvin Johns, MD  metoCLOPramide (REGLAN) 10 MG tablet Take 1 tablet (10 mg total) by mouth every 6 (six) hours as needed (for nausea or headache). 07/10/13   Karen Chafe Molpus, MD  metoprolol (TOPROL-XL) 100 MG 24 hr tablet Take 100 mg by mouth daily.      Historical Provider, MD  predniSONE (DELTASONE) 10 MG tablet Take 2 tablets (20 mg total) by mouth 2 (two) times daily. 10/10/13   Veryl Speak, MD  predniSONE (DELTASONE) 20 MG tablet Take 3 tablets (60 mg total) by mouth daily. 01/18/13   Dorie Rank, MD  Topiramate (TOPAMAX PO) Take by mouth.      Historical Provider, MD   BP 127/87  Pulse 68  Temp(Src) 97.9 F (36.6 C) (Oral)  Resp 16  Ht 5\' 5"  (1.651 m)  Wt 190 lb (86.183 kg)  BMI 31.62 kg/m2  SpO2 100% Vitals reviewed Physical Exam Physical Examination: General appearance - alert, well appearing, and in no distress Mental status - alert, oriented to person, place, and time Eyes - pupils equal and reactive, extraocular eye movements intact Mouth - mucous membranes moist, pharynx normal without lesions Neck - supple, no significant adenopathy Chest - clear to auscultation, no wheezes, rales or rhonchi, symmetric air entry Heart - normal rate, regular rhythm, normal S1, S2, no murmurs, rubs, clicks or gallops Abdomen - soft, nontender, nondistended, no masses or organomegaly Neurological - alert, oriented x 3, cranial nerves 2-12 tested and intact, strength 5/5 in extremities x 4, sensation intact Musculoskeletal - no joint tenderness, deformity or swelling Extremities - peripheral pulses normal, no pedal edema, no clubbing or cyanosis Skin - normal coloration and turgor, no rashes,   ED Course  Procedures (including critical care time)  10:56 AM d/[facility]- dr. Marko Stai, pt accepted for transfer.  Will call back with bed assignment Labs Review Labs  Reviewed  COMPREHENSIVE METABOLIC PANEL - Abnormal; Notable for the following:    GFR calc non Af Amer 88 (*)    All other components within normal limits  DIFFERENTIAL - Abnormal; Notable for the following:    Neutrophils Relative % 39 (*)    Lymphocytes Relative 49 (*)    All other components within normal limits  CBC  PREGNANCY, URINE  ETHANOL  PROTIME-INR  APTT  URINE RAPID DRUG SCREEN (HOSP PERFORMED)  URINALYSIS, ROUTINE W REFLEX MICROSCOPIC    Imaging Review No results found.   EKG Interpretation   Date/Time:  Tuesday October 23 2013 09:08:11 EDT Ventricular Rate:  68 PR Interval:  136 QRS Duration: 76 QT Interval:  386 QTC Calculation: 410 R Axis:   2 Text Interpretation:  Normal sinus rhythm Septal infarct , age  undetermined Abnormal ECG No significant change since last tracing  Confirmed  by Canary Brim  MD, [place] [phone]) on 10/23/2013 2:28:42 PM      MDM   Final diagnoses:  Other specified transient cerebral ischemias    Pt presenting with c/o intermittent weakness of right upper extremity with tingling, also palpitiations and near syncopal event. Currently she has no weakness, not a TPA candidate, but pt transferred to [facility] at her request for further inpatietn workup of TIA symptoms.     Threasa Beards, MD 10/25/13 1006

## 2013-10-23 NOTE — ED Notes (Addendum)
Pt made aware of plan to be admitted to [facility]. Waiting on transport at this time.

## 2014-04-24 ENCOUNTER — Encounter (HOSPITAL_BASED_OUTPATIENT_CLINIC_OR_DEPARTMENT_OTHER): Payer: Self-pay

## 2014-04-24 ENCOUNTER — Emergency Department (HOSPITAL_BASED_OUTPATIENT_CLINIC_OR_DEPARTMENT_OTHER): Payer: Medicaid Other

## 2014-04-24 ENCOUNTER — Emergency Department (HOSPITAL_BASED_OUTPATIENT_CLINIC_OR_DEPARTMENT_OTHER)
Admission: EM | Admit: 2014-04-24 | Discharge: 2014-04-25 | Disposition: A | Discharge status: Home / Self Care | Payer: Medicaid Other | Attending: Emergency Medicine | Admitting: Emergency Medicine

## 2014-04-24 DIAGNOSIS — I10 Essential (primary) hypertension: Secondary | ICD-10-CM | POA: Insufficient documentation

## 2014-04-24 DIAGNOSIS — Z79899 Other long term (current) drug therapy: Secondary | ICD-10-CM | POA: Diagnosis not present

## 2014-04-24 DIAGNOSIS — G43909 Migraine, unspecified, not intractable, without status migrainosus: Secondary | ICD-10-CM | POA: Insufficient documentation

## 2014-04-24 DIAGNOSIS — Z7952 Long term (current) use of systemic steroids: Secondary | ICD-10-CM | POA: Diagnosis not present

## 2014-04-24 DIAGNOSIS — Z791 Long term (current) use of non-steroidal anti-inflammatories (NSAID): Secondary | ICD-10-CM | POA: Diagnosis not present

## 2014-04-24 DIAGNOSIS — I509 Heart failure, unspecified: Secondary | ICD-10-CM | POA: Diagnosis not present

## 2014-04-24 DIAGNOSIS — M25472 Effusion, left ankle: Secondary | ICD-10-CM | POA: Diagnosis not present

## 2014-04-24 DIAGNOSIS — J45909 Unspecified asthma, uncomplicated: Secondary | ICD-10-CM | POA: Insufficient documentation

## 2014-04-24 DIAGNOSIS — M25572 Pain in left ankle and joints of left foot: Secondary | ICD-10-CM | POA: Diagnosis present

## 2014-04-24 MED ORDER — OXYCODONE-ACETAMINOPHEN 5-325 MG PO TABS
2.0000 | ORAL_TABLET | Freq: Once | ORAL | Status: AC
Start: 2014-04-25 — End: 2014-04-24
  Administered 2014-04-24: 2 via ORAL
  Filled 2014-04-24: qty 2
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
## 2014-04-25 MED ORDER — HYDROCODONE-ACETAMINOPHEN 5-325 MG PO TABS: 1.0000 | ORAL_TABLET | Freq: Four times a day (QID) | ORAL | Status: DC | PRN

## 2014-04-25 NOTE — ED Notes (Signed)
Patient transported to X-ray via stretcher per tech. 

## 2014-10-16 ENCOUNTER — Emergency Department (HOSPITAL_BASED_OUTPATIENT_CLINIC_OR_DEPARTMENT_OTHER)
Admission: EM | Admit: 2014-10-16 | Discharge: 2014-10-17 | Disposition: A | Discharge status: Home / Self Care | Payer: Medicaid Other | Attending: Emergency Medicine | Admitting: Emergency Medicine

## 2014-10-16 ENCOUNTER — Encounter (HOSPITAL_BASED_OUTPATIENT_CLINIC_OR_DEPARTMENT_OTHER): Payer: Self-pay | Admitting: *Deleted

## 2014-10-16 ENCOUNTER — Emergency Department (HOSPITAL_BASED_OUTPATIENT_CLINIC_OR_DEPARTMENT_OTHER): Payer: Medicaid Other

## 2014-10-16 DIAGNOSIS — Z7952 Long term (current) use of systemic steroids: Secondary | ICD-10-CM | POA: Diagnosis not present

## 2014-10-16 DIAGNOSIS — I509 Heart failure, unspecified: Secondary | ICD-10-CM | POA: Insufficient documentation

## 2014-10-16 DIAGNOSIS — Z791 Long term (current) use of non-steroidal anti-inflammatories (NSAID): Secondary | ICD-10-CM | POA: Insufficient documentation

## 2014-10-16 DIAGNOSIS — J45909 Unspecified asthma, uncomplicated: Secondary | ICD-10-CM | POA: Insufficient documentation

## 2014-10-16 DIAGNOSIS — Z79899 Other long term (current) drug therapy: Secondary | ICD-10-CM | POA: Diagnosis not present

## 2014-10-16 DIAGNOSIS — F1193 Opioid use, unspecified with withdrawal: Secondary | ICD-10-CM

## 2014-10-16 DIAGNOSIS — G43909 Migraine, unspecified, not intractable, without status migrainosus: Secondary | ICD-10-CM | POA: Insufficient documentation

## 2014-10-16 DIAGNOSIS — F1123 Opioid dependence with withdrawal: Secondary | ICD-10-CM | POA: Insufficient documentation

## 2014-10-16 DIAGNOSIS — I10 Essential (primary) hypertension: Secondary | ICD-10-CM | POA: Diagnosis not present

## 2014-10-16 DIAGNOSIS — R52 Pain, unspecified: Secondary | ICD-10-CM

## 2014-10-16 DIAGNOSIS — R079 Chest pain, unspecified: Secondary | ICD-10-CM | POA: Diagnosis present

## 2014-10-16 NOTE — ED Notes (Addendum)
Pt c/o left sided chest pain  And right arm "numbness" x 45 mins Pt states she " passed out" unwitnessed , pt amb into triage steady gait carrying 2 bags

## 2014-10-17 ENCOUNTER — Encounter (HOSPITAL_BASED_OUTPATIENT_CLINIC_OR_DEPARTMENT_OTHER): Payer: Self-pay | Admitting: Emergency Medicine

## 2014-10-17 LAB — RAPID URINE DRUG SCREEN, HOSP PERFORMED
Amphetamines: NOT DETECTED
BARBITURATES: NOT DETECTED
Benzodiazepines: NOT DETECTED
COCAINE: NOT DETECTED
Opiates: NOT DETECTED
TETRAHYDROCANNABINOL: NOT DETECTED

## 2014-10-17 LAB — CBC WITH DIFFERENTIAL/PLATELET
BASOS ABS: 0 10*3/uL (ref 0.0–0.1)
Basophils Relative: 0 % (ref 0–1)
Eosinophils Absolute: 0.3 10*3/uL (ref 0.0–0.7)
Eosinophils Relative: 4 % (ref 0–5)
HEMATOCRIT: 36.1 % (ref 36.0–46.0)
HEMOGLOBIN: 12.1 g/dL (ref 12.0–15.0)
LYMPHS ABS: 3.2 10*3/uL (ref 0.7–4.0)
Lymphocytes Relative: 48 % — ABNORMAL HIGH (ref 12–46)
MCH: 31.3 pg (ref 26.0–34.0)
MCHC: 33.5 g/dL (ref 30.0–36.0)
MCV: 93.5 fL (ref 78.0–100.0)
Monocytes Absolute: 0.5 10*3/uL (ref 0.1–1.0)
Monocytes Relative: 7 % (ref 3–12)
NEUTROS ABS: 2.7 10*3/uL (ref 1.7–7.7)
Neutrophils Relative %: 41 % — ABNORMAL LOW (ref 43–77)
Platelets: 241 10*3/uL (ref 150–400)
RBC: 3.86 MIL/uL — ABNORMAL LOW (ref 3.87–5.11)
RDW: 13.8 % (ref 11.5–15.5)
WBC: 6.7 10*3/uL (ref 4.0–10.5)

## 2014-10-17 LAB — BASIC METABOLIC PANEL
ANION GAP: 5 (ref 5–15)
BUN: 15 mg/dL (ref 6–20)
CALCIUM: 8.9 mg/dL (ref 8.9–10.3)
CHLORIDE: 110 mmol/L (ref 101–111)
CO2: 23 mmol/L (ref 22–32)
CREATININE: 1.22 mg/dL — AB (ref 0.44–1.00)
GFR calc Af Amer: >60 mL/min (ref >60)
GFR calc non Af Amer: 53 mL/min — ABNORMAL LOW (ref >60)
Glucose, Bld: 87 mg/dL (ref 65–99)
Potassium: 4 mmol/L (ref 3.5–5.1)
Sodium: 138 mmol/L (ref 135–145)

## 2014-10-17 LAB — TROPONIN I
Troponin I: <0.03 ng/mL (ref <0.031)
Troponin I: <0.03 ng/mL (ref <0.031)

## 2014-10-17 MED ORDER — ONDANSETRON HCL 8 MG PO TABS
ORAL_TABLET | ORAL | Status: AC
  Filled 2014-10-17: qty 1

## 2014-10-17 MED ORDER — ONDANSETRON 8 MG PO TBDP: ORAL_TABLET | ORAL | Status: DC

## 2014-10-17 MED ORDER — KETOROLAC TROMETHAMINE 60 MG/2ML IM SOLN: 60.0000 mg | Freq: Once | INTRAMUSCULAR | Status: AC

## 2014-10-17 MED ORDER — KETOROLAC TROMETHAMINE 30 MG/ML IJ SOLN
INTRAMUSCULAR | Status: AC
  Administered 2014-10-17: 30 mg
  Filled 2014-10-17: qty 1

## 2014-10-17 MED ORDER — ONDANSETRON 8 MG PO TBDP
8.0000 mg | ORAL_TABLET | Freq: Once | ORAL | Status: AC
  Administered 2014-10-17: 8 mg via ORAL
  Filled 2014-10-17: qty 1

## 2014-10-17 MED ORDER — METOCLOPRAMIDE HCL 5 MG/ML IJ SOLN
10.0000 mg | Freq: Once | INTRAMUSCULAR | Status: AC
  Administered 2014-10-17: 10 mg via INTRAMUSCULAR
  Filled 2014-10-17: qty 2

## 2014-10-17 MED ORDER — METOCLOPRAMIDE HCL 5 MG/ML IJ SOLN: 10.0000 mg | Freq: Once | INTRAMUSCULAR | Status: AC

## 2014-10-17 NOTE — ED Provider Notes (Signed)
CSN: [id number]     Arrival date & time 10/16/14  2300 History   First MD Initiated Contact with Patient 10/17/14 0008     Chief Complaint  Patient presents with  . Chest Pain     (Consider location/radiation/quality/duration/timing/severity/associated sxs/prior Treatment) Patient is a 44 y.o. female presenting with chest pain. The history is provided by the patient.  Chest Pain Pain location:  L chest Pain quality: dull   Pain radiates to the back: no   Pain severity:  Moderate Onset quality:  Gradual Timing:  Constant Progression:  Unchanged Chronicity:  Recurrent Context: not lifting, not at rest and no trauma   Relieved by:  Nothing Worsened by:  Nothing tried Ineffective treatments:  None tried Associated symptoms: nausea and vomiting   Associated symptoms: no abdominal pain, no cough, no diaphoresis, no dizziness, no fever, no lower extremity edema, no palpitations, no shortness of breath and no weakness   Risk factors: no surgery     Past Medical History  Diagnosis Date  . Asthma   . Blood transfusion   . CHF (congestive heart failure)   . Hypertension   . Cardiac arrhythmia   . Migraine   . Blood transfusion without reported diagnosis    Past Surgical History  Procedure Laterality Date  . Abdominal hysterectomy    . Tonsillectomy    . Orthopedic surgery     History reviewed. No pertinent family history. History  Substance Use Topics  . Smoking status: Never Smoker   . Smokeless tobacco: Never Used  . Alcohol Use: No   OB History    No data available     Review of Systems  Constitutional: Negative for fever and diaphoresis.  Respiratory: Negative for cough, choking, shortness of breath, wheezing and stridor.   Cardiovascular: Positive for chest pain. Negative for palpitations and leg swelling.  Gastrointestinal: Positive for nausea and vomiting. Negative for abdominal pain.  Neurological: Negative for dizziness and weakness.  All other systems  reviewed and are negative.     Allergies  Review of patient's allergies indicates no known allergies.  Home Medications   Prior to Admission medications   Medication Sig Start Date End Date Taking? Authorizing Provider  albuterol (PROVENTIL HFA;VENTOLIN HFA) 108 (90 BASE) MCG/ACT inhaler Inhale 2 puffs into the lungs every 6 (six) hours as needed. For shortness of breath and wheezing     Historical Provider, MD  albuterol (PROVENTIL) (2.5 MG/3ML) 0.083% nebulizer solution Take 3 mLs (2.5 mg total) by nebulization every 6 (six) hours as needed. 01/18/13   Dorie Rank, MD  HYDROcodone-acetaminophen (NORCO/VICODIN) 5-325 MG per tablet Take 1 tablet by mouth every 6 (six) hours as needed for moderate pain. 04/25/14   Jola Schmidt, MD  meloxicam (MOBIC) 7.5 MG tablet Take 1 tablet (7.5 mg total) by mouth daily. 04/10/13   Desirai Traxler, MD  methocarbamol (ROBAXIN) 500 MG tablet Take 1 tablet (500 mg total) by mouth 2 (two) times daily. 07/30/13   Kanton Kamel, MD  metoCLOPramide (REGLAN) 10 MG tablet Take 1 tablet (10 mg total) by mouth every 6 (six) hours as needed (nausea/headache). 12/07/12   Julianne Rice, MD  metoCLOPramide (REGLAN) 10 MG tablet Take 1 tablet (10 mg total) by mouth every 6 (six) hours. 01/02/13   Malvin Johns, MD  metoCLOPramide (REGLAN) 10 MG tablet Take 1 tablet (10 mg total) by mouth every 6 (six) hours as needed (for nausea or headache). 07/10/13   John Molpus, MD  metoprolol (TOPROL-XL) 100 MG  24 hr tablet Take 100 mg by mouth daily.      Historical Provider, MD  predniSONE (DELTASONE) 10 MG tablet Take 2 tablets (20 mg total) by mouth 2 (two) times daily. 10/10/13   Veryl Speak, MD  predniSONE (DELTASONE) 20 MG tablet Take 3 tablets (60 mg total) by mouth daily. 01/18/13   Dorie Rank, MD  Topiramate (TOPAMAX PO) Take by mouth.      Historical Provider, MD   BP 151/98 mmHg  Pulse 74  Temp(Src) 98.7 F (37.1 C) (Oral)  Resp 16  SpO2 99% Physical Exam  Constitutional:  She is oriented to person, place, and time. She appears well-developed and well-nourished. No distress.  HENT:  Head: Normocephalic and atraumatic.  Mouth/Throat: Oropharynx is clear and moist. No oropharyngeal exudate.  Eyes: Conjunctivae and EOM are normal. Pupils are equal, round, and reactive to light.  Dilated pupils   Neck: Normal range of motion. Neck supple.  Cardiovascular: Normal rate, regular rhythm and intact distal pulses.   Pulmonary/Chest: Effort normal and breath sounds normal. No respiratory distress. She has no wheezes. She has no rales. She exhibits no tenderness.  Abdominal: Soft. There is no tenderness. There is no rebound and no guarding.  Hyperactive bowel sounds throughout  Musculoskeletal: Normal range of motion. She exhibits no edema or tenderness.  Neurological: She is alert and oriented to person, place, and time. She has normal reflexes. She displays no atrophy and normal reflexes. No cranial nerve deficit. She exhibits normal muscle tone. Coordination and gait normal. GCS eye subscore is 4. GCS verbal subscore is 5. GCS motor subscore is 6. She displays no Babinski's sign on the right side. She displays no Babinski's sign on the left side.  Reflex Scores:      Tricep reflexes are 2+ on the right side and 2+ on the left side.      Bicep reflexes are 2+ on the right side and 2+ on the left side.      Brachioradialis reflexes are 2+ on the right side and 2+ on the left side.      Patellar reflexes are 2+ on the right side and 2+ on the left side.      Achilles reflexes are 2+ on the right side and 2+ on the left side. Skin: Skin is warm and dry. She is not diaphoretic.  Psychiatric: Her affect is labile. Her speech is rapid and/or pressured.  jittery    ED Course  Procedures (including critical care time) Labs Review Labs Reviewed  CBC WITH DIFFERENTIAL/PLATELET - Abnormal; Notable for the following:    RBC 3.86 (*)    Neutrophils Relative % 41 (*)     Lymphocytes Relative 48 (*)    All other components within normal limits  BASIC METABOLIC PANEL - Abnormal; Notable for the following:    Creatinine, Ser 1.22 (*)    GFR calc non Af Amer 53 (*)    All other components within normal limits  TROPONIN I  URINE RAPID DRUG SCREEN, HOSP PERFORMED  TROPONIN I    Imaging Review Dg Chest 2 View  10/17/2014   CLINICAL DATA:  Acute onset of left-sided chest pain and right arm numbness. Initial encounter.  EXAM: CHEST  2 VIEW  COMPARISON:  Chest radiograph performed 08/28/2012  FINDINGS: The lungs are well-aerated and clear. There is no evidence of focal opacification, pleural effusion or pneumothorax.  The heart is normal in size; the mediastinal contour is within normal limits. No acute osseous  abnormalities are seen.  IMPRESSION: No acute cardiopulmonary process seen.   Electronically Signed   By: Garald Balding M.D.   On: 10/17/2014 00:31     EKG Interpretation None      MDM   Final diagnoses:  Pain  PERC negative, wells 0.  Highly doubt PE in this patient   Date: 10/17/2014  Rate: 68  Rhythm: normal sinus rhythm  QRS Axis: normal  Intervals: normal  ST/T Wave abnormalities: normal  Conduction Disutrbances: none  Narrative Interpretation: unremarkable  He see not signs that this patient has a h/o arrhythmia in the [facility] system nor in care everywhere.   Patient has filled 3 narcotic RX within the past 2 weeks.  2 for norco and 1 for butrans all from Redgie Grayer PA.  Patient has no narcotics in her urine.  Suspect patient has taken all her medication is now withdrawing from same as she is vomiting and jittery.  She has been seen for same symptoms in the past.  She has ruled out for MI with 2 negative troponins and a normal EKG.  Heart score is 0.  Marland Kitchen       Veatrice Kells, MD 10/17/14 [phone]

## 2014-10-17 NOTE — ED Notes (Signed)
Protocal 3 hr troponin obtained and sent to lab

## 2016-05-13 ENCOUNTER — Ambulatory Visit (INDEPENDENT_AMBULATORY_CARE_PROVIDER_SITE_OTHER): Payer: Medicaid Other | Admitting: Podiatry

## 2016-05-13 ENCOUNTER — Encounter: Payer: Self-pay | Admitting: Podiatry

## 2016-05-13 VITALS — BP 148/106 | HR 67 | Ht 65.0 in | Wt 210.0 lb

## 2016-05-13 DIAGNOSIS — M79671 Pain in right foot: Secondary | ICD-10-CM | POA: Diagnosis not present

## 2016-05-13 DIAGNOSIS — G629 Polyneuropathy, unspecified: Secondary | ICD-10-CM | POA: Diagnosis not present

## 2016-05-13 DIAGNOSIS — M79672 Pain in left foot: Secondary | ICD-10-CM | POA: Diagnosis not present

## 2016-05-13 DIAGNOSIS — G608 Other hereditary and idiopathic neuropathies: Secondary | ICD-10-CM

## 2016-05-13 DIAGNOSIS — M216X9 Other acquired deformities of unspecified foot: Secondary | ICD-10-CM | POA: Diagnosis not present

## 2016-05-13 MED ORDER — OXYCODONE-ACETAMINOPHEN 7.5-325 MG PO TABS
1.0000 | ORAL_TABLET | Freq: Four times a day (QID) | ORAL | 0 refills | Status: DC | PRN
Start: 2016-05-13 — End: 2016-06-10
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
## 2016-05-13 NOTE — Patient Instructions (Addendum)
Seen for painful feet. Noted of neuropathic pain. May try Magnesium pills. Will try Compounding cream for a month. Pain medication also prescribed. May benefit from Custom/OTC orthotics.

## 2016-05-28 DIAGNOSIS — R079 Chest pain, unspecified: Secondary | ICD-10-CM | POA: Insufficient documentation

## 2016-05-28 DIAGNOSIS — R569 Unspecified convulsions: Secondary | ICD-10-CM | POA: Insufficient documentation

## 2016-05-29 DIAGNOSIS — F449 Dissociative and conversion disorder, unspecified: Secondary | ICD-10-CM | POA: Insufficient documentation

## 2016-06-10 ENCOUNTER — Ambulatory Visit (INDEPENDENT_AMBULATORY_CARE_PROVIDER_SITE_OTHER): Payer: Medicaid Other | Admitting: Podiatry

## 2016-06-10 DIAGNOSIS — G629 Polyneuropathy, unspecified: Secondary | ICD-10-CM

## 2016-06-10 DIAGNOSIS — M216X9 Other acquired deformities of unspecified foot: Secondary | ICD-10-CM

## 2016-06-10 DIAGNOSIS — M79672 Pain in left foot: Secondary | ICD-10-CM

## 2016-06-10 DIAGNOSIS — M79671 Pain in right foot: Secondary | ICD-10-CM | POA: Diagnosis not present

## 2016-06-10 DIAGNOSIS — G608 Other hereditary and idiopathic neuropathies: Secondary | ICD-10-CM

## 2016-06-10 MED ORDER — OXYCODONE-ACETAMINOPHEN 10-325 MG PO TABS: 1.0000 | ORAL_TABLET | ORAL | 0 refills | Status: AC

## 2016-06-10 NOTE — Patient Instructions (Signed)
Follow up on left foot pain. No improvement with Compounding cream. Percocet 10 helps. Prescribed Percocet 10/325 #30 to take one a day if needed. Return as needed.
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
## 2016-06-11 ENCOUNTER — Encounter: Payer: Self-pay | Admitting: Podiatry

## 2016-07-12 ENCOUNTER — Ambulatory Visit (INDEPENDENT_AMBULATORY_CARE_PROVIDER_SITE_OTHER): Payer: Medicaid Other | Admitting: Podiatry

## 2016-07-12 ENCOUNTER — Encounter: Payer: Self-pay | Admitting: Podiatry

## 2016-07-12 DIAGNOSIS — M79671 Pain in right foot: Secondary | ICD-10-CM | POA: Diagnosis not present

## 2016-07-12 DIAGNOSIS — G629 Polyneuropathy, unspecified: Secondary | ICD-10-CM | POA: Diagnosis not present

## 2016-07-12 DIAGNOSIS — M216X9 Other acquired deformities of unspecified foot: Secondary | ICD-10-CM

## 2016-07-12 DIAGNOSIS — G608 Other hereditary and idiopathic neuropathies: Secondary | ICD-10-CM

## 2016-07-12 DIAGNOSIS — M79672 Pain in left foot: Secondary | ICD-10-CM

## 2016-07-12 MED ORDER — OXYCODONE-ACETAMINOPHEN 7.5-325 MG PO TABS
1.0000 | ORAL_TABLET | Freq: Two times a day (BID) | ORAL | 0 refills | Status: DC | PRN
Start: 2016-07-12 — End: 2016-08-20

## 2016-07-12 NOTE — Patient Instructions (Signed)
Follow up on left foot cramp and pain. No other medication is helping other than pain medication. Percocet 7.5 prescribed. Return as needed.
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
## 2016-07-13 ENCOUNTER — Other Ambulatory Visit: Payer: Self-pay | Admitting: Podiatry

## 2016-07-13 MED ORDER — HYDROCODONE-ACETAMINOPHEN 5-325 MG PO TABS: 1.0000 | ORAL_TABLET | Freq: Four times a day (QID) | ORAL | 0 refills | Status: DC | PRN

## 2016-07-13 MED ORDER — MELOXICAM 7.5 MG PO TABS: 7.5000 mg | ORAL_TABLET | Freq: Every day | ORAL | 0 refills | Status: DC

## 2016-08-20 ENCOUNTER — Ambulatory Visit (INDEPENDENT_AMBULATORY_CARE_PROVIDER_SITE_OTHER): Payer: Medicaid Other | Admitting: Podiatry

## 2016-08-20 DIAGNOSIS — G6289 Other specified polyneuropathies: Secondary | ICD-10-CM | POA: Diagnosis not present

## 2016-08-20 DIAGNOSIS — M79672 Pain in left foot: Secondary | ICD-10-CM | POA: Diagnosis not present

## 2016-08-20 DIAGNOSIS — G608 Other hereditary and idiopathic neuropathies: Secondary | ICD-10-CM

## 2016-08-20 DIAGNOSIS — M79671 Pain in right foot: Secondary | ICD-10-CM | POA: Diagnosis not present

## 2016-08-20 DIAGNOSIS — G629 Polyneuropathy, unspecified: Secondary | ICD-10-CM

## 2016-08-20 MED ORDER — OXYCODONE-ACETAMINOPHEN 7.5-325 MG PO TABS: 1.0000 | ORAL_TABLET | Freq: Two times a day (BID) | ORAL | 0 refills | Status: DC | PRN

## 2016-08-20 NOTE — Patient Instructions (Addendum)
Left foot nerve pain continues. Pain medication ordered. Will try pain control device with socks.
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
## 2016-08-21 ENCOUNTER — Encounter: Payer: Self-pay | Admitting: Podiatry

## 2016-09-16 ENCOUNTER — Encounter: Payer: Self-pay | Admitting: Podiatry

## 2016-09-16 ENCOUNTER — Ambulatory Visit (INDEPENDENT_AMBULATORY_CARE_PROVIDER_SITE_OTHER): Payer: Medicaid Other | Admitting: Podiatry

## 2016-09-16 VITALS — BP 126/85 | HR 75

## 2016-09-16 DIAGNOSIS — G608 Other hereditary and idiopathic neuropathies: Secondary | ICD-10-CM

## 2016-09-16 DIAGNOSIS — G629 Polyneuropathy, unspecified: Secondary | ICD-10-CM | POA: Diagnosis not present

## 2016-09-16 DIAGNOSIS — M79671 Pain in right foot: Secondary | ICD-10-CM | POA: Diagnosis not present

## 2016-09-16 DIAGNOSIS — M79672 Pain in left foot: Secondary | ICD-10-CM | POA: Diagnosis not present

## 2016-09-16 MED ORDER — OXYCODONE-ACETAMINOPHEN 7.5-325 MG PO TABS: 1.0000 | ORAL_TABLET | Freq: Two times a day (BID) | ORAL | 0 refills | Status: DC | PRN

## 2016-09-16 NOTE — Patient Instructions (Signed)
Seen for pain in both feet. Will re order compounding cream and pain medication as per request. Return as needed.
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
## 2016-10-14 ENCOUNTER — Encounter (HOSPITAL_BASED_OUTPATIENT_CLINIC_OR_DEPARTMENT_OTHER): Payer: Self-pay | Admitting: *Deleted

## 2016-10-14 ENCOUNTER — Emergency Department (HOSPITAL_BASED_OUTPATIENT_CLINIC_OR_DEPARTMENT_OTHER)
Admission: EM | Admit: 2016-10-14 | Discharge: 2016-10-14 | Disposition: A | Discharge status: Home / Self Care | Payer: Medicaid Other | Attending: Emergency Medicine | Admitting: Emergency Medicine

## 2016-10-14 DIAGNOSIS — I110 Hypertensive heart disease with heart failure: Secondary | ICD-10-CM | POA: Insufficient documentation

## 2016-10-14 DIAGNOSIS — Z79899 Other long term (current) drug therapy: Secondary | ICD-10-CM | POA: Diagnosis not present

## 2016-10-14 DIAGNOSIS — J45909 Unspecified asthma, uncomplicated: Secondary | ICD-10-CM | POA: Insufficient documentation

## 2016-10-14 DIAGNOSIS — I509 Heart failure, unspecified: Secondary | ICD-10-CM | POA: Diagnosis not present

## 2016-10-14 DIAGNOSIS — N390 Urinary tract infection, site not specified: Secondary | ICD-10-CM | POA: Diagnosis not present

## 2016-10-14 DIAGNOSIS — R109 Unspecified abdominal pain: Secondary | ICD-10-CM

## 2016-10-14 DIAGNOSIS — R1032 Left lower quadrant pain: Secondary | ICD-10-CM | POA: Diagnosis present

## 2016-10-14 LAB — URINALYSIS, ROUTINE W REFLEX MICROSCOPIC
Bilirubin Urine: NEGATIVE
Glucose, UA: NEGATIVE mg/dL
Hgb urine dipstick: NEGATIVE
KETONES UR: NEGATIVE mg/dL
NITRITE: POSITIVE — AB
Protein, ur: 30 mg/dL — AB
Specific Gravity, Urine: 1.021 (ref 1.005–1.030)
pH: 6 (ref 5.0–8.0)

## 2016-10-14 LAB — URINALYSIS, MICROSCOPIC (REFLEX)

## 2016-10-14 MED ORDER — KETOROLAC TROMETHAMINE 60 MG/2ML IM SOLN
60.0000 mg | Freq: Once | INTRAMUSCULAR | Status: AC
  Administered 2016-10-14: 60 mg via INTRAMUSCULAR
  Filled 2016-10-14: qty 2

## 2016-10-14 MED ORDER — CEPHALEXIN 250 MG PO CAPS
1000.0000 mg | ORAL_CAPSULE | Freq: Once | ORAL | Status: AC
  Administered 2016-10-14: 1000 mg via ORAL
  Filled 2016-10-14: qty 4

## 2016-10-14 MED ORDER — CEPHALEXIN 500 MG PO CAPS: 500.0000 mg | ORAL_CAPSULE | Freq: Four times a day (QID) | ORAL | 0 refills | Status: DC

## 2016-10-14 MED ORDER — ONDANSETRON 8 MG PO TBDP
8.0000 mg | ORAL_TABLET | Freq: Once | ORAL | Status: AC
  Administered 2016-10-14: 8 mg via ORAL
  Filled 2016-10-14: qty 1

## 2016-10-14 NOTE — ED Triage Notes (Addendum)
Pt c/o left flank pain x 12 hrs, Pt goes to pain clinic took 2 percocet's w/o releif

## 2016-10-14 NOTE — ED Provider Notes (Signed)
[facility] Provider Note   CSN: [id number] Arrival date & time: 10/14/16  2154  By signing my name below, I, [name], attest that this documentation has been prepared under the direction and in the presence of Delora Fuel, MD. Electronically Signed: [name], Scribe. 10/14/16. 10:44 PM.  History   Chief Complaint Chief Complaint  Patient presents with  . Flank Pain   HPI Comments: Valerie Haney is a 46 y.o. female with no pertinent past medical history, who presents to the Emergency Department complaining of sudden-onset left-sided back pain that began earlier today. Patient reports 10/10 sharp left flank pain for the past 12 hours. Patient reports associated urinary retention, abdominal cramping, nausea, and vomiting. No medications taken prior to arrival. Patient states lying in the fetal position improves her pain. Patient denies any fever, chills, dysuria, hematuria, vaginal discharge, vaginal bleeding, or any additional symptoms.   The history is provided by the patient. No language interpreter was used.    Past Medical History:  Diagnosis Date  . Asthma   . Blood transfusion   . Blood transfusion without reported diagnosis   . Cardiac arrhythmia   . CHF (congestive heart failure) ([place])   . Hypertension   . Migraine     Patient Active Problem List   Diagnosis Date Noted  . Pain in both feet 05/13/2016    Past Surgical History:  Procedure Laterality Date  . ABDOMINAL HYSTERECTOMY    . ORTHOPEDIC SURGERY    . TONSILLECTOMY      OB History    No data available       Home Medications    Prior to Admission medications   Medication Sig Start Date End Date Taking? Authorizing Provider  albuterol (PROVENTIL HFA;VENTOLIN HFA) 108 (90 BASE) MCG/ACT inhaler Inhale 2 puffs into the lungs every 6 (six) hours as needed. For shortness of breath and wheezing     [provider]  albuterol (PROVENTIL) (2.5 MG/3ML) 0.083% nebulizer solution  Take 3 mLs (2.5 mg total) by nebulization every 6 (six) hours as needed. 01/18/13   Dorie Rank, MD  HYDROcodone-acetaminophen (NORCO/VICODIN) 5-325 MG tablet Take 1 tablet by mouth every 6 (six) hours as needed for moderate pain. 07/13/16   Sheard, Myeong O, DPM  meloxicam (MOBIC) 7.5 MG tablet Take 1 tablet (7.5 mg total) by mouth daily. 07/13/16   Sheard, Myeong O, DPM  methocarbamol (ROBAXIN) 500 MG tablet Take 1 tablet (500 mg total) by mouth 2 (two) times daily. 07/30/13   Palumbo, April, MD  metoCLOPramide (REGLAN) 10 MG tablet Take 1 tablet (10 mg total) by mouth every 6 (six) hours as needed (nausea/headache). 12/07/12   Julianne Rice, MD  metoCLOPramide (REGLAN) 10 MG tablet Take 1 tablet (10 mg total) by mouth every 6 (six) hours. 01/02/13   Malvin Johns, MD  metoCLOPramide (REGLAN) 10 MG tablet Take 1 tablet (10 mg total) by mouth every 6 (six) hours as needed (for nausea or headache). 07/10/13   Molpus, John, MD  metoprolol (TOPROL-XL) 100 MG 24 hr tablet Take 100 mg by mouth daily.      [provider]  ondansetron (ZOFRAN ODT) 8 MG disintegrating tablet 8mg  ODT q8 hours prn nausea 10/17/14   Palumbo, April, MD  oxyCODONE-acetaminophen (PERCOCET) 7.5-325 MG tablet Take 1 tablet by mouth every 12 (twelve) hours as needed. 09/16/16   Sheard, Myeong O, DPM  predniSONE (DELTASONE) 20 MG tablet Take 3 tablets (60 mg total) by mouth daily. 01/18/13   [name],  Wille Glaser, MD    Family History History reviewed. No pertinent family history.  Social History Social History  Substance Use Topics  . Smoking status: Never Smoker  . Smokeless tobacco: Never Used  . Alcohol use No     Allergies   Patient has no known allergies.   Review of Systems Review of Systems  Constitutional: Negative for chills and fever.  Gastrointestinal: Positive for abdominal pain, nausea and vomiting.  Genitourinary: Positive for flank pain. Negative for dysuria, vaginal bleeding, vaginal discharge and vaginal  pain.  All other systems reviewed and are negative.    Physical Exam Updated Vital Signs BP (!) 130/96 (BP Location: Left Arm)   Pulse (!) 106   Temp 98.3 F (36.8 C) (Oral)   Resp 20   Ht 5\' 5"  (1.651 m)   Wt 201 lb (91.2 kg)   SpO2 100%   BMI 33.45 kg/m   Physical Exam  Constitutional: She is oriented to person, place, and time. She appears well-developed and well-nourished.  HENT:  Head: Normocephalic and atraumatic.  Eyes: Pupils are equal, round, and reactive to light. EOM are normal.  Neck: Normal range of motion. Neck supple. No JVD present.  Cardiovascular: Normal rate, regular rhythm and normal heart sounds.   No murmur heard. Pulmonary/Chest: Effort normal and breath sounds normal. She has no wheezes. She has no rales. She exhibits no tenderness.  Abdominal: Soft. Bowel sounds are normal. She exhibits no distension and no mass. There is tenderness.  Mild left CVA tenderness.   Musculoskeletal: Normal range of motion. She exhibits no edema.  Lymphadenopathy:    She has no cervical adenopathy.  Neurological: She is alert and oriented to person, place, and time. No cranial nerve deficit. She exhibits normal muscle tone. Coordination normal.  Skin: Skin is warm and dry. No rash noted.  Psychiatric: She has a normal mood and affect. Her behavior is normal. Judgment and thought content normal.  Nursing note and vitals reviewed.    ED Treatments / Results  DIAGNOSTIC STUDIES: Oxygen Saturation is 100% on RA, normal by my interpretation.    COORDINATION OF CARE: 10:43 PM Discussed treatment plan with pt at bedside and pt agreed to plan, which includes keflex and Toradol.   Labs (all labs ordered are listed, but only abnormal results are displayed) Labs Reviewed  URINALYSIS, ROUTINE W REFLEX MICROSCOPIC - Abnormal; Notable for the following:       Result Value   Color, Urine AMBER (*)    APPearance CLOUDY (*)    Protein, ur 30 (*)    Nitrite POSITIVE (*)     Leukocytes, UA LARGE (*)    All other components within normal limits  URINALYSIS, MICROSCOPIC (REFLEX) - Abnormal; Notable for the following:    Bacteria, UA MANY (*)    Squamous Epithelial / LPF 6-30 (*)    All other components within normal limits     Procedures Procedures (including critical care time)  Medications Ordered in ED Medications  ondansetron (ZOFRAN-ODT) disintegrating tablet 8 mg (8 mg Oral Given 10/14/16 2251)  cephALEXin (KEFLEX) capsule 1,000 mg (1,000 mg Oral Given 10/14/16 2251)  ketorolac (TORADOL) injection 60 mg (60 mg Intramuscular Given 10/14/16 2251)     Initial Impression / Assessment and Plan / ED Course  I have reviewed the triage vital signs and the nursing notes.  Pertinent lab results that were available during my care of the patient were reviewed by me and considered in my medical decision making (see  chart for details).  Left flank pain. Urinalysis shows definite evidence of urinary tract infection with positive nitrite, too numerous to count WBCs, many bacteria. Specimen is sent for culture. She is afebrile and nontoxic in appearance, no indication for laboratory workup beyond urinalysis. She was offered a dose of IV ceftriaxone, but patient stated she did not wish to have an IV started. She does have ondansetron at home for nausea, and oxycodone-acetaminophen at home. She is discharged with prescription for cephalexin. Advised to use over-the-counter acetaminophen and NSAIDs as her primary medication for pain control, but advised that it was acceptable to use oxycodone-acetaminophen if needed. Return precautions discussed. Follow-up with PCP in 10 days to ensure adequate resolution. Old records were reviewed, and she has no relevant past visits.  Final Clinical Impressions(s) / ED Diagnoses   Final diagnoses:  Urinary tract infection without hematuria, site unspecified  Left flank pain    New Prescriptions New Prescriptions   CEPHALEXIN (KEFLEX)  500 MG CAPSULE    Take 1 capsule (500 mg total) by mouth 4 (four) times daily.   I personally performed the services described in this documentation, which was scribed in my presence. The recorded information has been reviewed and is accurate.       Delora Fuel, MD 61/68/37 [phone]

## 2016-10-14 NOTE — Discharge Instructions (Signed)
Take ibuprofen, naproxen, or acetaminophen as needed for pain control. For severe pain, you may take one of your percocet tablets. Return if you start running a high fever, if you start vomiting, or if pain is not being adequately controlled.

## 2016-10-17 LAB — URINE CULTURE

## 2016-10-18 ENCOUNTER — Telehealth: Payer: Self-pay | Admitting: Emergency Medicine

## 2016-10-18 NOTE — Progress Notes (Signed)
ED Antimicrobial Stewardship Positive Culture Follow Up   Valerie Haney is an 46 y.o. female who presented to [facility] on 10/14/2016 with a chief complaint of  Chief Complaint  Patient presents with  . Flank Pain    Recent Results (from the past 720 hour(s))  Urine culture     Status: Abnormal   Collection Time: 10/14/16 10:00 PM  Result Value Ref Range Status   Specimen Description URINE, RANDOM  Final   Special Requests NONE  Final   Culture >=100,000 COLONIES/mL ESCHERICHIA COLI (A)  Final   Report Status 10/17/2016 FINAL  Final   Organism ID, Bacteria ESCHERICHIA COLI (A)  Final      Susceptibility   Escherichia coli - MIC*    AMPICILLIN >=32 RESISTANT Resistant     CEFAZOLIN >=64 RESISTANT Resistant     CEFTRIAXONE <=1 SENSITIVE Sensitive     CIPROFLOXACIN <=0.25 SENSITIVE Sensitive     GENTAMICIN <=1 SENSITIVE Sensitive     IMIPENEM <=0.25 SENSITIVE Sensitive     NITROFURANTOIN <=16 SENSITIVE Sensitive     TRIMETH/SULFA <=20 SENSITIVE Sensitive     AMPICILLIN/SULBACTAM >=32 RESISTANT Resistant     PIP/TAZO <=4 SENSITIVE Sensitive     Extended ESBL NEGATIVE Sensitive     * >=100,000 COLONIES/mL ESCHERICHIA COLI    [x]  Treated with cephalexin, organism resistant to prescribed antimicrobial []  Patient discharged originally without antimicrobial agent and treatment is now indicated  New antibiotic prescription: Bactrim DS - 1 tablet twice a day for 14 days  ED Provider: Carmon Sails, PA-C   Susa Raring, PharmD 10/18/2016, 10:00 AM PGY2 Infectious Diseases Pharmacy Resident Phone# [phone]

## 2016-10-18 NOTE — Telephone Encounter (Signed)
Post ED Visit - Positive Culture Follow-up: Successful Patient Follow-Up  Culture assessed and recommendations reviewed by: []  Elenor Quinones, Pharm.D. []  Heide Guile, Pharm.D., BCPS AQ-ID []  Parks Neptune, Pharm.D., BCPS []  Alycia Rossetti, Pharm.D., BCPS []  [place], Pharm.D., BCPS, AAHIVP []  Legrand Como, Pharm.D., BCPS, AAHIVP []  Salome Arnt, PharmD, BCPS []  Dimitri Ped, PharmD, BCPS []  Vincenza Hews, PharmD, BCPS Jimmy Footman PharmD  Positive urine culture  []  Patient discharged without antimicrobial prescription and treatment is now indicated [x]  Organism is resistant to prescribed ED discharge antimicrobial []  Patient with positive blood cultures  Changes discussed with ED provider: Carmon Sails PA New antibiotic prescription stop Cephalexin, start Bactrim DS 1 tablet po bid x 14 days Called to [facility]  10/18/16 1318   Hazle Nordmann 10/18/2016, 1:18 PM

## 2016-11-04 ENCOUNTER — Encounter: Payer: Self-pay | Admitting: Podiatry

## 2016-11-04 ENCOUNTER — Ambulatory Visit (INDEPENDENT_AMBULATORY_CARE_PROVIDER_SITE_OTHER): Payer: Medicaid Other | Admitting: Podiatry

## 2016-11-04 DIAGNOSIS — M79672 Pain in left foot: Secondary | ICD-10-CM

## 2016-11-04 DIAGNOSIS — M79671 Pain in right foot: Secondary | ICD-10-CM | POA: Diagnosis not present

## 2016-11-04 DIAGNOSIS — G629 Polyneuropathy, unspecified: Secondary | ICD-10-CM

## 2016-11-04 DIAGNOSIS — G608 Other hereditary and idiopathic neuropathies: Secondary | ICD-10-CM

## 2016-11-04 MED ORDER — OXYCODONE-ACETAMINOPHEN 10-325 MG PO TABS: 1.0000 | ORAL_TABLET | Freq: Two times a day (BID) | ORAL | 0 refills | Status: DC | PRN

## 2016-11-04 NOTE — Patient Instructions (Signed)
Seen for severe foot pain. Dose increased as per request. Return as needed.
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
## 2016-11-24 ENCOUNTER — Ambulatory Visit (INDEPENDENT_AMBULATORY_CARE_PROVIDER_SITE_OTHER): Payer: Medicaid Other | Admitting: Podiatry

## 2016-11-24 ENCOUNTER — Encounter: Payer: Self-pay | Admitting: Podiatry

## 2016-11-24 DIAGNOSIS — S99921A Unspecified injury of right foot, initial encounter: Secondary | ICD-10-CM | POA: Diagnosis not present

## 2016-11-24 DIAGNOSIS — M79671 Pain in right foot: Secondary | ICD-10-CM | POA: Diagnosis not present
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
## 2016-12-08 ENCOUNTER — Ambulatory Visit (INDEPENDENT_AMBULATORY_CARE_PROVIDER_SITE_OTHER): Payer: Medicaid Other | Admitting: Podiatry

## 2016-12-08 DIAGNOSIS — S99921A Unspecified injury of right foot, initial encounter: Secondary | ICD-10-CM | POA: Diagnosis not present

## 2016-12-08 DIAGNOSIS — G608 Other hereditary and idiopathic neuropathies: Secondary | ICD-10-CM

## 2016-12-08 DIAGNOSIS — G629 Polyneuropathy, unspecified: Secondary | ICD-10-CM

## 2016-12-08 DIAGNOSIS — M79671 Pain in right foot: Secondary | ICD-10-CM

## 2016-12-08 MED ORDER — OXYCODONE-ACETAMINOPHEN 10-325 MG PO TABS: 1.0000 | ORAL_TABLET | Freq: Two times a day (BID) | ORAL | 0 refills | Status: DC | PRN

## 2016-12-08 NOTE — Patient Instructions (Signed)
Right foot lesion healed well. Pain medication re ordered. Return as needed.
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
## 2016-12-20 ENCOUNTER — Encounter: Payer: Self-pay | Admitting: Podiatry

## 2017-01-11 ENCOUNTER — Ambulatory Visit (INDEPENDENT_AMBULATORY_CARE_PROVIDER_SITE_OTHER): Payer: Medicaid Other | Admitting: Podiatry

## 2017-01-11 ENCOUNTER — Encounter: Payer: Self-pay | Admitting: Podiatry

## 2017-01-11 DIAGNOSIS — G629 Polyneuropathy, unspecified: Secondary | ICD-10-CM | POA: Diagnosis not present

## 2017-01-11 DIAGNOSIS — G608 Other hereditary and idiopathic neuropathies: Secondary | ICD-10-CM

## 2017-01-11 DIAGNOSIS — M79672 Pain in left foot: Secondary | ICD-10-CM

## 2017-01-11 DIAGNOSIS — M79671 Pain in right foot: Secondary | ICD-10-CM | POA: Diagnosis not present

## 2017-01-11 MED ORDER — OXYCODONE-ACETAMINOPHEN 10-325 MG PO TABS: 1.0000 | ORAL_TABLET | Freq: Every day | ORAL | 0 refills | Status: DC | PRN

## 2017-01-11 NOTE — Patient Instructions (Addendum)
Seen for pain in left foot. Pain medication prescribed with reduced dose to one a day from two a day. May consider pain management center. Return as needed.
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
## 2017-02-08 ENCOUNTER — Ambulatory Visit: Payer: Medicaid Other | Admitting: Podiatry

## 2017-02-08 ENCOUNTER — Encounter: Payer: Self-pay | Admitting: Podiatry

## 2017-02-08 DIAGNOSIS — G608 Other hereditary and idiopathic neuropathies: Secondary | ICD-10-CM

## 2017-02-08 DIAGNOSIS — G629 Polyneuropathy, unspecified: Principal | ICD-10-CM

## 2017-02-08 DIAGNOSIS — M79672 Pain in left foot: Secondary | ICD-10-CM

## 2017-02-08 DIAGNOSIS — M79671 Pain in right foot: Secondary | ICD-10-CM

## 2017-02-08 MED ORDER — OXYCODONE-ACETAMINOPHEN 10-325 MG PO TABS: 1.0000 | ORAL_TABLET | Freq: Two times a day (BID) | ORAL | 0 refills | Status: DC | PRN

## 2017-02-08 NOTE — Patient Instructions (Signed)
Seen for pain in left foot. Reviewed need for pain management center. Has an appointment in January. Final prescription for pain written.
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
## 2017-03-15 ENCOUNTER — Ambulatory Visit: Payer: Medicaid Other | Admitting: Podiatry

## 2017-03-15 ENCOUNTER — Encounter: Payer: Self-pay | Admitting: Podiatry

## 2017-03-15 DIAGNOSIS — M79672 Pain in left foot: Secondary | ICD-10-CM

## 2017-03-15 DIAGNOSIS — M21969 Unspecified acquired deformity of unspecified lower leg: Secondary | ICD-10-CM

## 2017-03-15 DIAGNOSIS — G608 Other hereditary and idiopathic neuropathies: Secondary | ICD-10-CM

## 2017-03-15 DIAGNOSIS — M6702 Short Achilles tendon (acquired), left ankle: Secondary | ICD-10-CM

## 2017-03-15 DIAGNOSIS — M6701 Short Achilles tendon (acquired), right ankle: Secondary | ICD-10-CM | POA: Insufficient documentation

## 2017-03-15 DIAGNOSIS — G629 Polyneuropathy, unspecified: Secondary | ICD-10-CM

## 2017-03-15 MED ORDER — OXYCODONE-ACETAMINOPHEN 10-325 MG PO TABS: 1.0000 | ORAL_TABLET | Freq: Two times a day (BID) | ORAL | 0 refills | Status: DC | PRN

## 2017-03-15 NOTE — Patient Instructions (Addendum)
Seen for pain in left foot. Noted of severe tendon contracture left Achilles tendon. X-ray done. Return for pre-op, Gastroc Recession and scar revision.
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
## 2017-03-25 DIAGNOSIS — Y929 Unspecified place or not applicable: Secondary | ICD-10-CM | POA: Insufficient documentation

## 2017-03-25 DIAGNOSIS — I110 Hypertensive heart disease with heart failure: Secondary | ICD-10-CM | POA: Insufficient documentation

## 2017-03-25 DIAGNOSIS — M25462 Effusion, left knee: Secondary | ICD-10-CM | POA: Diagnosis not present

## 2017-03-25 DIAGNOSIS — J45909 Unspecified asthma, uncomplicated: Secondary | ICD-10-CM | POA: Diagnosis not present

## 2017-03-25 DIAGNOSIS — I509 Heart failure, unspecified: Secondary | ICD-10-CM | POA: Insufficient documentation

## 2017-03-25 DIAGNOSIS — X58XXXA Exposure to other specified factors, initial encounter: Secondary | ICD-10-CM | POA: Insufficient documentation

## 2017-03-25 DIAGNOSIS — Z79899 Other long term (current) drug therapy: Secondary | ICD-10-CM | POA: Diagnosis not present

## 2017-03-25 DIAGNOSIS — Y939 Activity, unspecified: Secondary | ICD-10-CM | POA: Insufficient documentation

## 2017-03-25 DIAGNOSIS — Y999 Unspecified external cause status: Secondary | ICD-10-CM | POA: Insufficient documentation

## 2017-03-25 DIAGNOSIS — S8992XA Unspecified injury of left lower leg, initial encounter: Secondary | ICD-10-CM | POA: Diagnosis present

## 2017-03-26 ENCOUNTER — Other Ambulatory Visit: Payer: Self-pay

## 2017-03-26 ENCOUNTER — Encounter (HOSPITAL_BASED_OUTPATIENT_CLINIC_OR_DEPARTMENT_OTHER): Payer: Self-pay

## 2017-03-26 ENCOUNTER — Emergency Department (HOSPITAL_BASED_OUTPATIENT_CLINIC_OR_DEPARTMENT_OTHER): Payer: Medicaid Other

## 2017-03-26 ENCOUNTER — Emergency Department (HOSPITAL_BASED_OUTPATIENT_CLINIC_OR_DEPARTMENT_OTHER)
Admission: EM | Admit: 2017-03-26 | Discharge: 2017-03-26 | Disposition: A | Discharge status: Home / Self Care | Payer: Medicaid Other | Attending: Emergency Medicine | Admitting: Emergency Medicine

## 2017-03-26 DIAGNOSIS — R52 Pain, unspecified: Secondary | ICD-10-CM

## 2017-03-26 DIAGNOSIS — M25462 Effusion, left knee: Secondary | ICD-10-CM

## 2017-03-26 DIAGNOSIS — M25562 Pain in left knee: Secondary | ICD-10-CM

## 2017-03-26 MED ORDER — PREDNISONE 10 MG PO TABS: 20.0000 mg | ORAL_TABLET | Freq: Two times a day (BID) | ORAL | 0 refills | Status: DC

## 2017-03-26 NOTE — ED Provider Notes (Signed)
[facility] EMERGENCY DEPARTMENT Provider Note   CSN: [id number] Arrival date & time: 03/25/17  2346     History   Chief Complaint Chief Complaint  Patient presents with  . Knee Pain    HPI Valerie Haney is a 46 y.o. female.  Patient is a 46 year old female with history of CHF, hypertension, migraines presenting for evaluation of left knee pain.  This began several days ago in the absence of any injury or trauma.  She reports she feels a "grinding" in her knee when she walks.  It feels more swollen to her.  She denies any fevers or chills.  Pain is worse with ambulation and range of motion and relieved with rest.   The history is provided by the patient.  Knee Pain   This is a new problem. The current episode started 2 days ago. The problem occurs constantly. The problem has been rapidly worsening. The pain is present in the left knee. The pain is moderate. Associated symptoms include limited range of motion. Pertinent negatives include no numbness. Treatments tried: Percocet. The treatment provided mild relief.    Past Medical History:  Diagnosis Date  . Asthma   . Blood transfusion   . Blood transfusion without reported diagnosis   . Cardiac arrhythmia   . CHF (congestive heart failure) ([place])   . Hypertension   . Migraine     Patient Active Problem List   Diagnosis Date Noted  . 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 Pain in both feet 05/13/2016    Past Surgical History:  Procedure Laterality Date  . ABDOMINAL HYSTERECTOMY    . ORTHOPEDIC SURGERY    . TONSILLECTOMY      OB History    No data available       Home Medications    Prior to Admission medications   Medication Sig Start Date End Date Taking? Authorizing Provider  albuterol (PROVENTIL HFA;VENTOLIN HFA) 108 (90 BASE) MCG/ACT inhaler Inhale 2 puffs into the lungs every 6 (six) hours as needed. For shortness of breath and wheezing     [provider]  albuterol  (PROVENTIL) (2.5 MG/3ML) 0.083% nebulizer solution Take 3 mLs (2.5 mg total) by nebulization every 6 (six) hours as needed. 01/18/13   Dorie Rank, MD  cephALEXin (KEFLEX) 500 MG capsule Take 1 capsule (500 mg total) by mouth 4 (four) times daily. 8/75/64   Delora Fuel, MD  HYDROcodone-acetaminophen (NORCO/VICODIN) 5-325 MG tablet Take 1 tablet by mouth every 6 (six) hours as needed for moderate pain. 07/13/16   Sheard, Myeong O, DPM  meloxicam (MOBIC) 7.5 MG tablet Take 1 tablet (7.5 mg total) by mouth daily. 07/13/16   Sheard, Myeong O, DPM  methocarbamol (ROBAXIN) 500 MG tablet Take 1 tablet (500 mg total) by mouth 2 (two) times daily. 07/30/13   Palumbo, April, MD  metoCLOPramide (REGLAN) 10 MG tablet Take 1 tablet (10 mg total) by mouth every 6 (six) hours as needed (nausea/headache). 12/07/12   Julianne Rice, MD  metoCLOPramide (REGLAN) 10 MG tablet Take 1 tablet (10 mg total) by mouth every 6 (six) hours. 01/02/13   Malvin Johns, MD  metoCLOPramide (REGLAN) 10 MG tablet Take 1 tablet (10 mg total) by mouth every 6 (six) hours as needed (for nausea or headache). 07/10/13   Molpus, John, MD  metoprolol (TOPROL-XL) 100 MG 24 hr tablet Take 100 mg by mouth daily.      [provider]  ondansetron (ZOFRAN ODT) 8 MG disintegrating tablet  8mg  ODT q8 hours prn nausea 10/17/14   Palumbo, April, MD  oxyCODONE-acetaminophen (PERCOCET) 10-325 MG tablet Take 1 tablet by mouth 2 (two) times daily as needed for up to 1 dose for pain. 03/15/17   Sheard, Myeong O, DPM  predniSONE (DELTASONE) 20 MG tablet Take 3 tablets (60 mg total) by mouth daily. 01/18/13   Dorie Rank, MD    Family History No family history on file.  Social History Social History   Tobacco Use  . Smoking status: Never Smoker  . Smokeless tobacco: Never Used  Substance Use Topics  . Alcohol use: No  . Drug use: No     Allergies   Patient has no known allergies.   Review of Systems Review of Systems  Neurological:  Negative for numbness.  All other systems reviewed and are negative.    Physical Exam Updated Vital Signs Ht 5\' 5"  (1.651 m)   Wt 86.2 kg (190 lb)   BMI 31.62 kg/m   Physical Exam   ED Treatments / Results  Labs (all labs ordered are listed, but only abnormal results are displayed) Labs Reviewed - No data to display  EKG  EKG Interpretation None       Radiology No results found.  Procedures Procedures (including critical care time)  Medications Ordered in ED Medications - No data to display   Initial Impression / Assessment and Plan / ED Course  I have reviewed the triage vital signs and the nursing notes.  Pertinent labs & imaging results that were available during my care of the patient were reviewed by me and considered in my medical decision making (see chart for details).  X-rays show a small to moderate-sized effusion.  She will be treated with prednisone continued pain medication as previously prescribed and follow-up with her primary doctor if not improving.  Final Clinical Impressions(s) / ED Diagnoses   Final diagnoses:  None    ED Discharge Orders    None       Veryl Speak, MD 03/26/17 [phone]

## 2017-03-26 NOTE — ED Notes (Signed)
Pt verbalizes understanding of d/c instructions and denies any further needs at this time. 

## 2017-03-26 NOTE — Discharge Instructions (Signed)
Prednisone as prescribed  Continue your pain medications as needed.  Follow up with your primary doctor if you are not improving in the next week.

## 2017-03-26 NOTE — ED Triage Notes (Signed)
Pt c/o pain to the L knee x 2 days. Pt has applied heat/cold compress without relief.Pt has a Percocet on hand that she took and helped her knee.

## 2017-03-26 NOTE — ED Notes (Signed)
PMS intact before and after. Pt tolerated well. All questions answered. 

## 2017-04-06 ENCOUNTER — Ambulatory Visit: Payer: Medicaid Other | Admitting: Podiatry

## 2017-04-06 DIAGNOSIS — G608 Other hereditary and idiopathic neuropathies: Secondary | ICD-10-CM

## 2017-04-06 DIAGNOSIS — M79671 Pain in right foot: Secondary | ICD-10-CM

## 2017-04-06 DIAGNOSIS — M6702 Short Achilles tendon (acquired), left ankle: Principal | ICD-10-CM

## 2017-04-06 DIAGNOSIS — G629 Polyneuropathy, unspecified: Secondary | ICD-10-CM

## 2017-04-06 DIAGNOSIS — M6701 Short Achilles tendon (acquired), right ankle: Secondary | ICD-10-CM

## 2017-04-06 DIAGNOSIS — M79672 Pain in left foot: Secondary | ICD-10-CM

## 2017-04-06 MED ORDER — OXYCODONE-ACETAMINOPHEN 10-325 MG PO TABS: 1.0000 | ORAL_TABLET | Freq: Two times a day (BID) | ORAL | 0 refills | Status: DC | PRN

## 2017-04-06 NOTE — Patient Instructions (Signed)
Reviewed x-ray findings. Surgery planned for Tendon lengthening and soft tissue excision from the back of left ankle area. Will schedule pending surgery center schedule.

## 2017-04-07 ENCOUNTER — Encounter: Payer: Self-pay | Admitting: Podiatry
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
## 2017-04-08 DIAGNOSIS — M795 Residual foreign body in soft tissue: Secondary | ICD-10-CM

## 2017-04-08 DIAGNOSIS — D2372 Other benign neoplasm of skin of left lower limb, including hip: Secondary | ICD-10-CM | POA: Diagnosis not present

## 2017-04-13 ENCOUNTER — Encounter: Payer: Self-pay | Admitting: Podiatry

## 2017-04-13 ENCOUNTER — Ambulatory Visit (INDEPENDENT_AMBULATORY_CARE_PROVIDER_SITE_OTHER): Payer: Medicaid Other | Admitting: Podiatry

## 2017-04-13 DIAGNOSIS — M79672 Pain in left foot: Secondary | ICD-10-CM

## 2017-04-13 DIAGNOSIS — M79671 Pain in right foot: Secondary | ICD-10-CM
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
## 2017-04-13 NOTE — Patient Instructions (Signed)
5 days post op wound healing normal. Dressing change done. Noted of no edema or erythema. Return in one week.

## 2017-04-16 ENCOUNTER — Emergency Department (HOSPITAL_BASED_OUTPATIENT_CLINIC_OR_DEPARTMENT_OTHER)
Admission: EM | Admit: 2017-04-16 | Discharge: 2017-04-16 | Disposition: A | Discharge status: Home / Self Care | Payer: Medicaid Other | Attending: Emergency Medicine | Admitting: Emergency Medicine

## 2017-04-16 ENCOUNTER — Emergency Department (HOSPITAL_BASED_OUTPATIENT_CLINIC_OR_DEPARTMENT_OTHER): Payer: Medicaid Other

## 2017-04-16 ENCOUNTER — Other Ambulatory Visit: Payer: Self-pay

## 2017-04-16 ENCOUNTER — Encounter (HOSPITAL_BASED_OUTPATIENT_CLINIC_OR_DEPARTMENT_OTHER): Payer: Self-pay | Admitting: *Deleted

## 2017-04-16 DIAGNOSIS — M545 Low back pain: Secondary | ICD-10-CM | POA: Diagnosis not present

## 2017-04-16 DIAGNOSIS — Y9389 Activity, other specified: Secondary | ICD-10-CM | POA: Diagnosis not present

## 2017-04-16 DIAGNOSIS — R2242 Localized swelling, mass and lump, left lower limb: Secondary | ICD-10-CM | POA: Diagnosis not present

## 2017-04-16 DIAGNOSIS — Y998 Other external cause status: Secondary | ICD-10-CM | POA: Diagnosis not present

## 2017-04-16 DIAGNOSIS — I110 Hypertensive heart disease with heart failure: Secondary | ICD-10-CM | POA: Insufficient documentation

## 2017-04-16 DIAGNOSIS — I509 Heart failure, unspecified: Secondary | ICD-10-CM | POA: Diagnosis not present

## 2017-04-16 DIAGNOSIS — M25562 Pain in left knee: Secondary | ICD-10-CM | POA: Insufficient documentation

## 2017-04-16 DIAGNOSIS — Y92410 Unspecified street and highway as the place of occurrence of the external cause: Secondary | ICD-10-CM | POA: Insufficient documentation

## 2017-04-16 NOTE — Discharge Instructions (Signed)
Please read attached information. If you experience any new or worsening signs or symptoms please return to the emergency room for evaluation. Please follow-up with your primary care provider or specialist as discussed.  °

## 2017-04-16 NOTE — ED Notes (Signed)
NAD at this time. Pt is stable and going home.  
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
## 2017-04-20 ENCOUNTER — Encounter: Payer: Self-pay | Admitting: Podiatry

## 2017-04-20 ENCOUNTER — Ambulatory Visit (INDEPENDENT_AMBULATORY_CARE_PROVIDER_SITE_OTHER): Payer: Medicaid Other | Admitting: Podiatry

## 2017-04-20 DIAGNOSIS — Z9889 Other specified postprocedural states: Secondary | ICD-10-CM

## 2017-04-20 NOTE — Patient Instructions (Signed)
2 weeks post op wound healing normal. Dressing changed. Return in one week for suture removal.

## 2017-04-20 NOTE — Progress Notes (Signed)
2 weeks S/P excision foreign body inclusion cyst and staples removal left posterior heel, 04/08/17. Wound healing normal. Patient continues to experience pain at incision site. Wound cleansed and redressed. Will remove suture next week.

## 2017-04-27 ENCOUNTER — Encounter: Payer: Self-pay | Admitting: Podiatry

## 2017-04-27 ENCOUNTER — Ambulatory Visit (INDEPENDENT_AMBULATORY_CARE_PROVIDER_SITE_OTHER): Payer: Medicaid Other | Admitting: Podiatry

## 2017-04-27 DIAGNOSIS — Z9889 Other specified postprocedural states: Secondary | ICD-10-CM

## 2017-04-27 NOTE — Patient Instructions (Signed)
3 weeks post op wound healing normal. Suture removed. May keep tap on and use compression stockinet. Ok to get wet. Return in 2 weeks.

## 2017-04-27 NOTE — Progress Notes (Signed)
Wound healing normal with decreased pain at surgery site. Clean and dry incision lines. All suture removed and Mefix tape placed with compression stockinet and home care instruction. Return in 2 weeks.

## 2017-05-11 ENCOUNTER — Ambulatory Visit (INDEPENDENT_AMBULATORY_CARE_PROVIDER_SITE_OTHER): Payer: Medicaid Other | Admitting: Podiatry

## 2017-05-11 DIAGNOSIS — Z9889 Other specified postprocedural states: Secondary | ICD-10-CM

## 2017-05-11 MED ORDER — OXYCODONE-ACETAMINOPHEN 7.5-325 MG PO TABS: 1.0000 | ORAL_TABLET | Freq: Four times a day (QID) | ORAL | 0 refills | Status: DC | PRN

## 2017-05-11 NOTE — Patient Instructions (Signed)
Post op wound healing normal on back of right foot. Muscle cramp may due to abnormal gait pattern. Must relax during ambulation. Pain medication prescribed for the next 7 days.

## 2017-05-12 ENCOUNTER — Emergency Department (HOSPITAL_BASED_OUTPATIENT_CLINIC_OR_DEPARTMENT_OTHER)
Admission: EM | Admit: 2017-05-12 | Discharge: 2017-05-12 | Disposition: A | Discharge status: Home / Self Care | Payer: Medicaid Other | Attending: Physician Assistant | Admitting: Physician Assistant

## 2017-05-12 ENCOUNTER — Encounter (HOSPITAL_BASED_OUTPATIENT_CLINIC_OR_DEPARTMENT_OTHER): Payer: Self-pay

## 2017-05-12 ENCOUNTER — Emergency Department (HOSPITAL_BASED_OUTPATIENT_CLINIC_OR_DEPARTMENT_OTHER): Payer: Medicaid Other

## 2017-05-12 ENCOUNTER — Encounter: Payer: Self-pay | Admitting: Podiatry

## 2017-05-12 ENCOUNTER — Other Ambulatory Visit: Payer: Self-pay

## 2017-05-12 DIAGNOSIS — J111 Influenza due to unidentified influenza virus with other respiratory manifestations: Secondary | ICD-10-CM

## 2017-05-12 DIAGNOSIS — I509 Heart failure, unspecified: Secondary | ICD-10-CM | POA: Insufficient documentation

## 2017-05-12 DIAGNOSIS — I110 Hypertensive heart disease with heart failure: Secondary | ICD-10-CM | POA: Insufficient documentation

## 2017-05-12 DIAGNOSIS — R509 Fever, unspecified: Secondary | ICD-10-CM | POA: Diagnosis present

## 2017-05-12 DIAGNOSIS — J45909 Unspecified asthma, uncomplicated: Secondary | ICD-10-CM | POA: Insufficient documentation

## 2017-05-12 LAB — INFLUENZA PANEL BY PCR (TYPE A & B)
INFLAPCR: POSITIVE — AB
INFLBPCR: NEGATIVE

## 2017-05-12 MED ORDER — BENZONATATE 100 MG PO CAPS: 100.0000 mg | ORAL_CAPSULE | Freq: Three times a day (TID) | ORAL | 0 refills | Status: DC

## 2017-05-12 MED ORDER — IBUPROFEN 800 MG PO TABS
800.0000 mg | ORAL_TABLET | Freq: Once | ORAL | Status: AC
  Administered 2017-05-12: 800 mg via ORAL
  Filled 2017-05-12: qty 1

## 2017-05-12 MED ORDER — ALBUTEROL SULFATE HFA 108 (90 BASE) MCG/ACT IN AERS: 1.0000 | INHALATION_SPRAY | Freq: Four times a day (QID) | RESPIRATORY_TRACT | 0 refills | Status: DC | PRN

## 2017-05-12 MED ORDER — OSELTAMIVIR PHOSPHATE 75 MG PO CAPS: 75.0000 mg | ORAL_CAPSULE | Freq: Two times a day (BID) | ORAL | 0 refills | Status: DC

## 2017-05-12 NOTE — ED Provider Notes (Signed)
[facility] EMERGENCY DEPARTMENT Provider Note   CSN: [id number] Arrival date & time: 05/12/17  1603     History   Chief Complaint Chief Complaint  Patient presents with  . Cough    HPI Valerie Haney is a 47 y.o. female has no history of asthma, hypertension who presents for evaluation of 3 days of body aches, fever, cough, nasal congestion, rhinorrhea, decreased appetite.  Patient reports that symptoms have been ongoing for 3 days.  She has taken over-the-counter TheraFlu, Tylenol and ibuprofen with minimal relief in symptoms.  Patient reports that she has been febrile over the course of 3 days.  Patient patient states decreased appetite but has been able to tolerate liquids.  She has not had any vomiting.  Patient denies any focal pain but states that she has body aches all over.  She states that she has had to increase her inhaler use but denies any wheezing or difficulty breathing.  She has had a nonproductive cough.  Patient denies any chest pain, difficulty breathing, abdominal pain, dysuria, hematuria.  The history is provided by the patient.    Past Medical History:  Diagnosis Date  . Asthma   . Blood transfusion   . Blood transfusion without reported diagnosis   . Cardiac arrhythmia   . CHF (congestive heart failure) ([place])   . Hypertension   . Migraine     Patient Active Problem List   Diagnosis Date Noted  . 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 Pain in both feet 05/13/2016    Past Surgical History:  Procedure Laterality Date  . ABDOMINAL HYSTERECTOMY    . ORTHOPEDIC SURGERY    . TONSILLECTOMY      OB History    No data available       Home Medications    Prior to Admission medications   Medication Sig Start Date End Date Taking? Authorizing Provider  albuterol (PROVENTIL HFA;VENTOLIN HFA) 108 (90 Base) MCG/ACT inhaler Inhale 1-2 puffs into the lungs every 6 (six) hours as needed for wheezing or shortness of breath. 05/12/17    Volanda Napoleon, PA-C  benzonatate (TESSALON) 100 MG capsule Take 1 capsule (100 mg total) by mouth every 8 (eight) hours. 05/12/17   Volanda Napoleon, PA-C  oseltamivir (TAMIFLU) 75 MG capsule Take 1 capsule (75 mg total) by mouth every 12 (twelve) hours. 05/12/17   Volanda Napoleon, PA-C    Family History No family history on file.  Social History Social History   Tobacco Use  . Smoking status: Never Smoker  . Smokeless tobacco: Never Used  Substance Use Topics  . Alcohol use: No  . Drug use: No     Allergies   Patient has no known allergies.   Review of Systems Review of Systems  Constitutional: Positive for appetite change, chills and fever.  HENT: Positive for congestion and rhinorrhea.   Respiratory: Positive for cough. Negative for shortness of breath and wheezing.   Cardiovascular: Negative for chest pain.  Gastrointestinal: Negative for abdominal pain, diarrhea, nausea and vomiting.  Genitourinary: Negative for dysuria.  Musculoskeletal: Positive for myalgias.  Neurological: Positive for headaches.  All other systems reviewed and are negative.    Physical Exam Updated Vital Signs BP (!) 125/97 (BP Location: Right Arm)   Pulse 97   Temp 99.7 F (37.6 C) (Oral)   Resp 20   SpO2 98%   Physical Exam  Constitutional: She is oriented to person, place, and time. She appears well-developed  and well-nourished.  Laying comfortably on exam   HENT:  Head: Normocephalic and atraumatic.  Right Ear: Tympanic membrane normal.  Left Ear: Tympanic membrane normal.  Nose: Mucosal edema present.  Mouth/Throat: Uvula is midline, oropharynx is clear and moist and mucous membranes are normal.  Eyes: Conjunctivae, EOM and lids are normal. Pupils are equal, round, and reactive to light.  Neck: Full passive range of motion without pain. Neck supple.  Cardiovascular: Normal rate, regular rhythm, normal heart sounds and normal pulses. Exam reveals no gallop and no friction  rub.  No murmur heard. Pulmonary/Chest: Effort normal and breath sounds normal.  No evidence of respiratory distress. Able to speak in full sentences without difficulty. No wheezing noted.   Abdominal: Soft. Normal appearance and bowel sounds are normal. There is no tenderness. There is no rigidity and no guarding.  Abdomen is soft, non-tender. No distention.   Musculoskeletal: Normal range of motion.  Neurological: She is alert and oriented to person, place, and time.  Skin: Skin is warm and dry. Capillary refill takes less than 2 seconds.  Psychiatric: She has a normal mood and affect. Her speech is normal.  Nursing note and vitals reviewed.    ED Treatments / Results  Labs (all labs ordered are listed, but only abnormal results are displayed) Labs Reviewed  INFLUENZA PANEL BY PCR (TYPE A & B)    EKG  EKG Interpretation None       Radiology Dg Chest 2 View  Result Date: 05/12/2017 CLINICAL DATA:  Fever, flu-like symptoms for 3 days EXAM: CHEST  2 VIEW COMPARISON:  06/10/2016 FINDINGS: Normal heart size, mediastinal contours, and pulmonary vascularity. Lungs clear. No acute infiltrate, pleural effusion or pneumothorax. Osseous structures unremarkable. IMPRESSION: No acute abnormalities. Electronically Signed   By: Lavonia Dana M.D.   On: 05/12/2017 18:22    Procedures Procedures (including critical care time)  Medications Ordered in ED Medications  ibuprofen (ADVIL,MOTRIN) tablet 800 mg (800 mg Oral Given 05/12/17 1625)     Initial Impression / Assessment and Plan / ED Course  I have reviewed the triage vital signs and the nursing notes.  Pertinent labs & imaging results that were available during my care of the patient were reviewed by me and considered in my medical decision making (see chart for details).     47 y.o. female with past medical history of asthma who presents for evaluation of 3 days of generalized body aches, cough, nasal congestion, rhinorrhea,  decreased appetite.  No chest pain, difficulty breathing, wheezing.  No vomiting.  Patient does have a history of asthma and has been having to use her inhaler more frequently.  On initial ED arrival, patient is febrile.  Vitals otherwise stable.  Patient given antipyretics here in the department.  On exam, patient does exhibit some nasal mucosal edema.  Lungs clear to auscultation.  No evidence of wheezing or rales that would indicate asthma exacerbation, pneumonia or fluid overload.  Abdomen is soft, nondistended without any tenderness.  Given history/physical exam, concern for influenza.  Given cough and asthma history, chest x-ray ordered at triage.  Chest x-ray negative for any acute infectious etiology.  Discussed results with patient.  Patient is able to tolerate p.o. in the department without any difficulty.  Vital signs are stable.  Fever has improved after antipyretics.  Influenza is pending.  I discussed with patient regarding treatment options.  Given that patient does have a history of asthma, will plan to give her option of Tamiflu  even though she was outside of the 48 window.  Discussed with patient regarding risk first benefits of Tamiflu.  Patient instructed to follow-up with primary care doctor in the next 24-48 hours for further evaluation. Patient had ample opportunity for questions and discussion. All patient's questions were answered with full understanding. Strict return precautions discussed. Patient expresses understanding and agreement to plan.    Final Clinical Impressions(s) / ED Diagnoses   Final diagnoses:  Influenza    ED Discharge Orders        Ordered    oseltamivir (TAMIFLU) 75 MG capsule  Every 12 hours     05/12/17 1920    benzonatate (TESSALON) 100 MG capsule  Every 8 hours     05/12/17 1920    albuterol (PROVENTIL HFA;VENTOLIN HFA) 108 (90 Base) MCG/ACT inhaler  Every 6 hours PRN     05/12/17 1921       Volanda Napoleon, PA-C 05/13/17 1121    [place],  Fredia Sorrow, MD 05/17/17 [phone]

## 2017-05-12 NOTE — Discharge Instructions (Signed)
You can take Tylenol or Ibuprofen as directed for pain. You can alternate Tylenol and Ibuprofen every 4 hours. If you take Tylenol at 1pm, then you can take Ibuprofen at 5pm. Then you can take Tylenol again at 9pm.   As we discussed, your influenza will not come back in tomorrow.  He can call and obtain the results.  I provided you with the Tamiflu since he do have asthma.  Be aware that this may cause some GI upset.  Take Tessalon Perles as directed for cough.  Use albuterol inhaler as directed.  Return to the emergency department for any worsening pain, fever, vomiting, difficulty breathing, chest pain or any other worsening or concerning symptoms.

## 2017-05-12 NOTE — ED Triage Notes (Signed)
C/o flu like sx x 3 days-last tylenol 2 hours PTA-NAD-steady gait

## 2017-05-12 NOTE — Progress Notes (Signed)
Subjective: 47 y.o. year old female patient presents complaining of pain in left foot.  This is S/P 8 weeks Excision foreign body inclusion cyst and staple removal left foot posterior heel 04/08/17. Stated that her left foot is hurting on top and requests if surgery if possible. Her back of heel has healed well with decreased pain.   Objective: Well healed post op wound left posterior Achilles tendon area with satisfactory outcome. Pain in dorsum left foot.  Assessment: Normal post op recovery at surgery site left foot. Pain at dorsum left foot with multiple metallic surgical clips through out.  Treatment: Reviewed findings and available treatment options.  As per request 7 day dose pain medication prescribed. Patient was advised that for future pain medication, it must be through pain management center. Return as needed.

## 2017-05-12 NOTE — ED Notes (Signed)
Pt c/o upset stomach from motrin, given crackers and ginger ale.  Pt c/o fever, cough, body aches for the last three days, her grandchild has been sick.

## 2017-06-01 ENCOUNTER — Ambulatory Visit: Payer: Medicaid Other | Admitting: Podiatry

## 2017-06-01 ENCOUNTER — Encounter: Payer: Self-pay | Admitting: Podiatry

## 2017-06-01 DIAGNOSIS — M79672 Pain in left foot: Secondary | ICD-10-CM | POA: Diagnosis not present

## 2017-06-01 DIAGNOSIS — R234 Changes in skin texture: Secondary | ICD-10-CM

## 2017-06-01 DIAGNOSIS — G8929 Other chronic pain: Secondary | ICD-10-CM

## 2017-06-01 DIAGNOSIS — M795 Residual foreign body in soft tissue: Secondary | ICD-10-CM

## 2017-06-01 MED ORDER — OXYCODONE-ACETAMINOPHEN 10-325 MG PO TABS: 1.0000 | ORAL_TABLET | Freq: Three times a day (TID) | ORAL | 0 refills | Status: DC | PRN

## 2017-06-01 NOTE — Patient Instructions (Signed)
Left foot pain possible from intolerance to multiple internal metal clips from old surgery. Reviewed findings and possible removal of the clips. Surgery consent form reviewed. Pain medication 7 day dose prescribed.

## 2017-06-01 NOTE — Progress Notes (Signed)
Subjective: 47 y.o. year old female patient presents complaining of painful cramping toes. Stated that her toes lock up in shoe and hurts to wear closed in shoes. She is in much pain especially at night. Patient reports relieve of pain in back of Achilles tendon area since the last surgery.  Objective: Dermatologic: Severe scar contracture left dorsum along the Extensor digitorum longus tendon. Reduced superficial soft tissue mass back of left Achilles tendon area since surgery. Vascular: Pedal pulses are all palpable. Orthopedic: No growth deformities. Neurologic: Pain over EDL left foot just distal to TNJ.  Assessment: Eschar left foot center dorsum. Intolerance to foreign body left foot, post skin graft internal staples 10+ affecting EDL. Satisfactory pain relief in back of left Achilles tendon area since surgery (Removal of multiple foreign body and scar revision).  Treatment: Reviewed findings and available treatment options. Will schedule to have the accessible clips removed and do scar revision from dorsum of left foot.

## 2017-06-23 ENCOUNTER — Ambulatory Visit: Payer: Medicaid Other | Admitting: Podiatry

## 2017-06-23 ENCOUNTER — Encounter: Payer: Self-pay | Admitting: Podiatry

## 2017-06-23 DIAGNOSIS — G6289 Other specified polyneuropathies: Secondary | ICD-10-CM

## 2017-06-23 DIAGNOSIS — M79672 Pain in left foot: Secondary | ICD-10-CM

## 2017-06-23 DIAGNOSIS — R234 Changes in skin texture: Secondary | ICD-10-CM

## 2017-06-23 DIAGNOSIS — G8929 Other chronic pain: Secondary | ICD-10-CM

## 2017-06-23 DIAGNOSIS — G629 Polyneuropathy, unspecified: Secondary | ICD-10-CM

## 2017-06-23 DIAGNOSIS — G608 Other hereditary and idiopathic neuropathies: Secondary | ICD-10-CM

## 2017-06-23 MED ORDER — OXYCODONE-ACETAMINOPHEN 10-325 MG PO TABS: 1.0000 | ORAL_TABLET | Freq: Three times a day (TID) | ORAL | 0 refills | Status: DC | PRN

## 2017-06-23 NOTE — Patient Instructions (Addendum)
Seen for pain in left foot. As per request pain medication prescribed for 7 days. Will refer to pain management center for pain control.

## 2017-06-23 NOTE — Progress Notes (Signed)
Subjective: 47 y.o. year old female patient presents complaining of pain on left foot.  Patient request prescription for pain medication.  Objective: Dermatologic: Multiple old scar through out left and right foot. Back of left foot over the Achilles tendon area show no further protruding skin lesions. Vascular: Pedal pulses are all palpable. Orthopedic: No gross deformities. Neurologic: hyperalgic left foot.  Assessment: Hyperalgic left foot possible due to multiple foreign body from old skin graft.  Treatment: Reviewed findings and advised to be treated at pain management center for chronic foot pain. 7 day dose pain medication prescribed.

## 2017-06-27 ENCOUNTER — Telehealth: Payer: Self-pay | Admitting: *Deleted

## 2017-06-27 NOTE — Telephone Encounter (Signed)
Patient wants to see Brandy Hale at [facility] in [place] instead of where we initially referred her to

## 2017-06-28 NOTE — Addendum Note (Signed)
Addended by: Camelia Phenes on: 06/28/2017 04:03 PM   Modules accepted: Orders

## 2017-06-29 NOTE — Telephone Encounter (Signed)
Patient needs to get a new Medicaid card so that it can be scanned into the system. In order to properly complete there referral process her insurance card is needed. Otherwise the other office may deem her a self pay patient

## 2017-06-30 ENCOUNTER — Encounter: Payer: Self-pay | Admitting: Podiatry

## 2017-06-30 ENCOUNTER — Ambulatory Visit: Payer: Medicaid Other | Admitting: Podiatry

## 2017-06-30 DIAGNOSIS — M79672 Pain in left foot: Secondary | ICD-10-CM

## 2017-06-30 DIAGNOSIS — G8929 Other chronic pain: Secondary | ICD-10-CM

## 2017-06-30 NOTE — Progress Notes (Signed)
Stated that her left foot cramped and stiffened up over. Top of big toe skin got bursted open.  She was referred out for pain management with Dr. Maryann Alar but could not submit all the document since she has no current insurance info. Patient understands this and will go back to [facility] department to receive updated insurance info.

## 2017-06-30 NOTE — Patient Instructions (Signed)
Last week patient was referred out for pain management with Dr. Maryann Alar but could not submit all the document since there is no current insurance info. Patient understands this and will go back to [facility] department to receive updated insurance info so that we can complete referral process.

## 2017-09-18 DIAGNOSIS — G40909 Epilepsy, unspecified, not intractable, without status epilepticus: Secondary | ICD-10-CM | POA: Insufficient documentation

## 2018-02-15 IMAGING — CR DG KNEE 1-2V*L*
2 series · 2 of 2 positions shown · non-contrast
Comparison: None.

CLINICAL DATA: Acute onset of left knee pain and swelling.

EXAM:
LEFT KNEE - 1-2 VIEW

[t knee ap left]
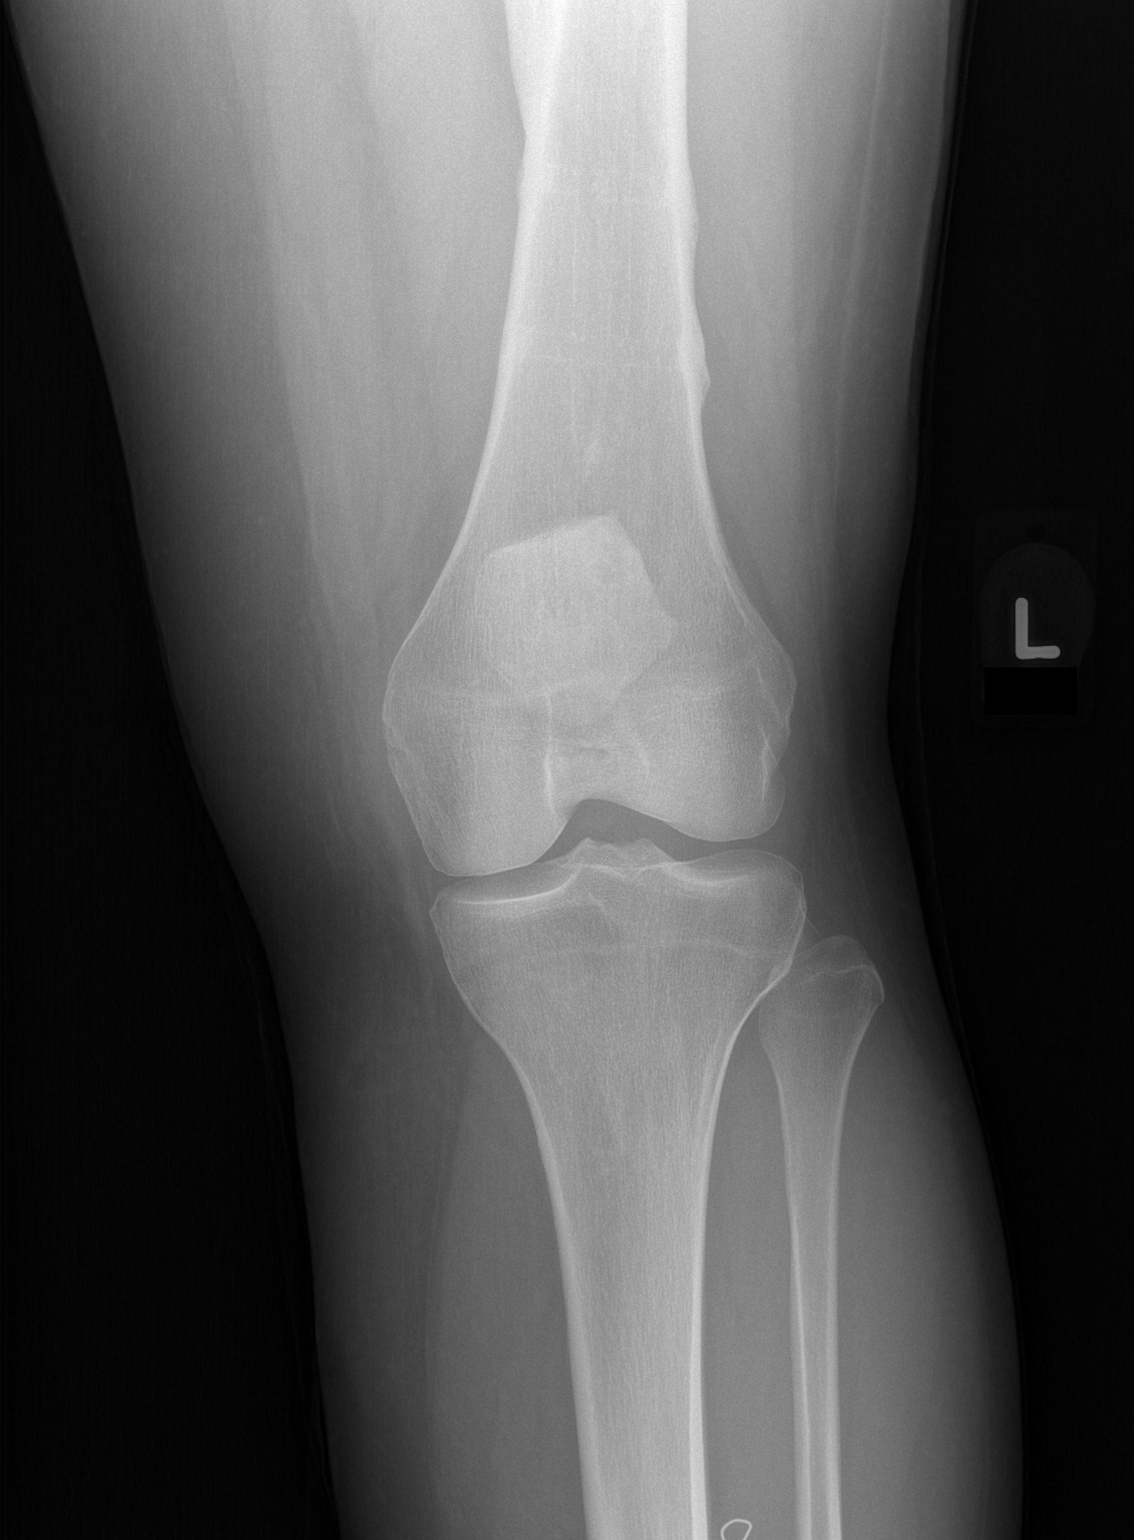

[t knee lat left]
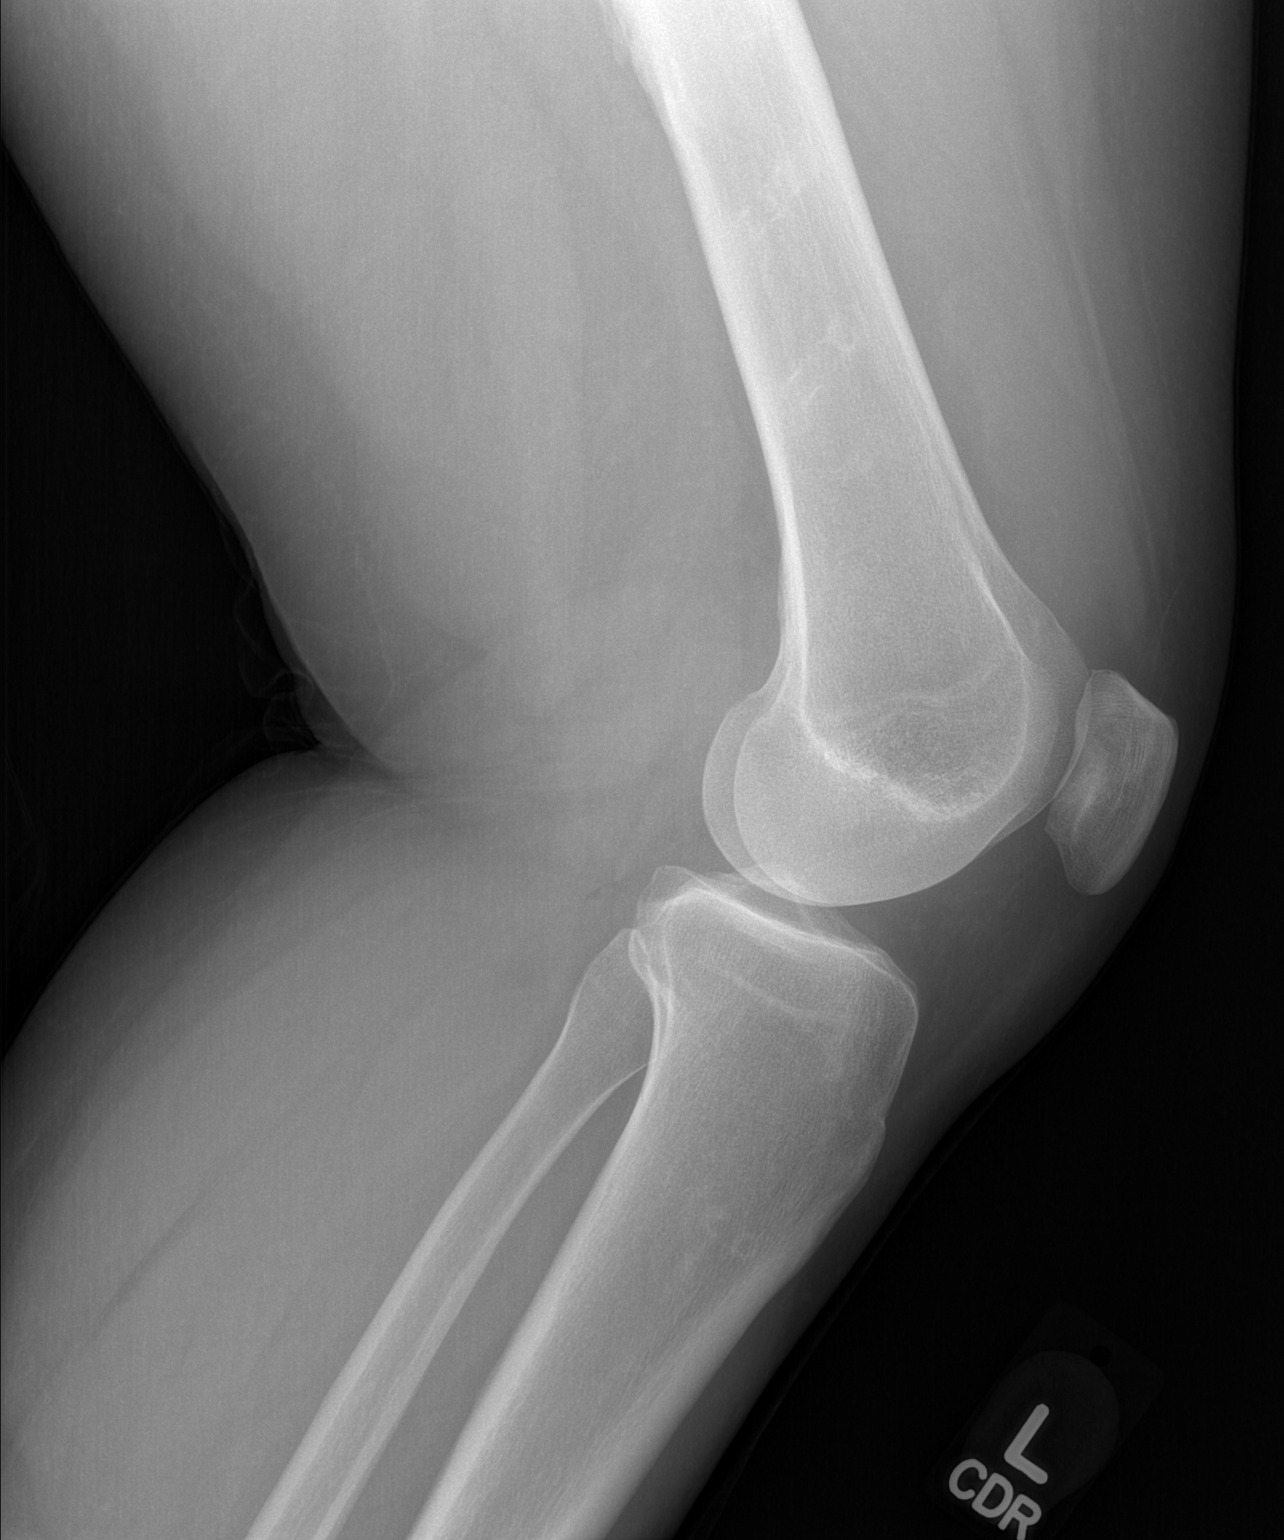

[2 of 2 positions shown; findings below may reference images not displayed]

FINDINGS: There is no evidence of fracture or dislocation. The joint spaces
are preserved. No significant degenerative change is seen; the
patellofemoral joint is grossly unremarkable in appearance. There is
chronic deformity of the distal femoral diaphysis.

A small to moderate knee joint effusion is noted. The visualized
soft tissues are otherwise unremarkable in appearance.
IMPRESSION: 1. No evidence of fracture or dislocation.
2. Small to moderate knee joint effusion noted. If the patient's
symptoms persist, MRI of the left knee could be considered for
further evaluation, to assess for underlying internal derangement.

## 2018-12-19 ENCOUNTER — Encounter (HOSPITAL_BASED_OUTPATIENT_CLINIC_OR_DEPARTMENT_OTHER): Payer: Self-pay | Admitting: Adult Health

## 2018-12-19 ENCOUNTER — Other Ambulatory Visit: Payer: Self-pay

## 2018-12-19 ENCOUNTER — Emergency Department (HOSPITAL_BASED_OUTPATIENT_CLINIC_OR_DEPARTMENT_OTHER)
Admission: EM | Admit: 2018-12-19 | Discharge: 2018-12-19 | Disposition: A | Discharge status: Home / Self Care | Payer: Medicaid Other | Attending: Emergency Medicine | Admitting: Emergency Medicine

## 2018-12-19 DIAGNOSIS — Y92410 Unspecified street and highway as the place of occurrence of the external cause: Secondary | ICD-10-CM | POA: Diagnosis not present

## 2018-12-19 DIAGNOSIS — Y999 Unspecified external cause status: Secondary | ICD-10-CM | POA: Insufficient documentation

## 2018-12-19 DIAGNOSIS — M546 Pain in thoracic spine: Secondary | ICD-10-CM

## 2018-12-19 DIAGNOSIS — T148XXA Other injury of unspecified body region, initial encounter: Secondary | ICD-10-CM

## 2018-12-19 DIAGNOSIS — Y939 Activity, unspecified: Secondary | ICD-10-CM | POA: Insufficient documentation

## 2018-12-19 DIAGNOSIS — I509 Heart failure, unspecified: Secondary | ICD-10-CM | POA: Diagnosis not present

## 2018-12-19 DIAGNOSIS — S29012A Strain of muscle and tendon of back wall of thorax, initial encounter: Secondary | ICD-10-CM | POA: Diagnosis not present

## 2018-12-19 DIAGNOSIS — S299XXA Unspecified injury of thorax, initial encounter: Secondary | ICD-10-CM | POA: Diagnosis present

## 2018-12-19 DIAGNOSIS — I110 Hypertensive heart disease with heart failure: Secondary | ICD-10-CM | POA: Insufficient documentation

## 2018-12-19 DIAGNOSIS — J45909 Unspecified asthma, uncomplicated: Secondary | ICD-10-CM | POA: Diagnosis not present

## 2018-12-19 MED ORDER — METHOCARBAMOL 500 MG PO TABS: 500.0000 mg | ORAL_TABLET | Freq: Two times a day (BID) | ORAL | 0 refills | Status: DC
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
## 2018-12-19 NOTE — ED Notes (Signed)
ED Provider at bedside. 

## 2018-12-19 NOTE — Discharge Instructions (Signed)
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
## 2019-05-02 ENCOUNTER — Emergency Department (HOSPITAL_BASED_OUTPATIENT_CLINIC_OR_DEPARTMENT_OTHER)
Admission: EM | Admit: 2019-05-02 | Discharge: 2019-05-02 | Disposition: A | Discharge status: Home / Self Care | Payer: Medicaid Other | Attending: Emergency Medicine | Admitting: Emergency Medicine

## 2019-05-02 ENCOUNTER — Emergency Department (HOSPITAL_BASED_OUTPATIENT_CLINIC_OR_DEPARTMENT_OTHER): Payer: Medicaid Other

## 2019-05-02 ENCOUNTER — Other Ambulatory Visit: Payer: Self-pay

## 2019-05-02 ENCOUNTER — Encounter (HOSPITAL_BASED_OUTPATIENT_CLINIC_OR_DEPARTMENT_OTHER): Payer: Self-pay | Admitting: *Deleted

## 2019-05-02 DIAGNOSIS — B349 Viral infection, unspecified: Secondary | ICD-10-CM | POA: Diagnosis not present

## 2019-05-02 DIAGNOSIS — I509 Heart failure, unspecified: Secondary | ICD-10-CM | POA: Insufficient documentation

## 2019-05-02 DIAGNOSIS — Z20822 Contact with and (suspected) exposure to covid-19: Secondary | ICD-10-CM | POA: Insufficient documentation

## 2019-05-02 DIAGNOSIS — J45909 Unspecified asthma, uncomplicated: Secondary | ICD-10-CM | POA: Diagnosis not present

## 2019-05-02 DIAGNOSIS — I10 Essential (primary) hypertension: Secondary | ICD-10-CM | POA: Insufficient documentation

## 2019-05-02 DIAGNOSIS — M791 Myalgia, unspecified site: Secondary | ICD-10-CM | POA: Diagnosis present

## 2019-05-02 LAB — SARS CORONAVIRUS 2 AG (30 MIN TAT): SARS Coronavirus 2 Ag: NEGATIVE

## 2019-05-02 MED ORDER — OXYCODONE-ACETAMINOPHEN 5-325 MG PO TABS
2.0000 | ORAL_TABLET | Freq: Once | ORAL | Status: AC
Start: 2019-05-02 — End: 2019-05-02
  Administered 2019-05-02: 2 via ORAL
  Filled 2019-05-02: qty 2

## 2019-05-02 NOTE — ED Triage Notes (Signed)
C/o bodyaches , fever , chills , cough and SOB x 2 days

## 2019-05-02 NOTE — Discharge Instructions (Signed)
Recommend taking anti-inflammatory such as Motrin for your body aches.  Return to ER if you develop chest pain, difficulty in breathing, rash, fever, other new concerning symptom.  Recommend following isolation precautions until we have the results of your coronavirus test, which will hopefully come back tomorrow.

## 2019-05-02 NOTE — ED Provider Notes (Signed)
[facility] EMERGENCY DEPARTMENT Provider Note   CSN: [id number] Arrival date & time: 05/02/19  1555     History Chief Complaint  Patient presents with  . Generalized Body Aches    Valerie Haney is a 49 y.o. female.  Presents ER with complaints of body aches.  States that she feels like all her bones are hurting her.  Has been having symptoms for the past couple days.  Also having some chills, occasional dry cough.  Not feeling short of breath.  No chest pain.  No documented fever, no known contacts with COVID-19.  Has been able to eat and drink without difficulty.  Has history of asthma, has been using her inhaler occasionally at home.  HPI     Past Medical History:  Diagnosis Date  . Asthma   . Blood transfusion   . Blood transfusion without reported diagnosis   . Cardiac arrhythmia   . CHF (congestive heart failure) ([place])   . Hypertension   . Migraine     Patient Active Problem List   Diagnosis Date Noted  . 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 Pain in both feet 05/13/2016    Past Surgical History:  Procedure Laterality Date  . ABDOMINAL HYSTERECTOMY    . ORTHOPEDIC SURGERY    . TONSILLECTOMY       OB History   No obstetric history on file.     No family history on file.  Social History   Tobacco Use  . Smoking status: Never Smoker  . Smokeless tobacco: Never Used  Substance Use Topics  . Alcohol use: No  . Drug use: No    Home Medications Prior to Admission medications   Medication Sig Start Date End Date Taking? Authorizing Provider  albuterol (PROVENTIL HFA;VENTOLIN HFA) 108 (90 Base) MCG/ACT inhaler Inhale 1-2 puffs into the lungs every 6 (six) hours as needed for wheezing or shortness of breath. 05/12/17   Volanda Napoleon, PA-C  benzonatate (TESSALON) 100 MG capsule Take 1 capsule (100 mg total) by mouth every 8 (eight) hours. 05/12/17   Volanda Napoleon, PA-C  methocarbamol (ROBAXIN) 500 MG tablet Take 1 tablet  (500 mg total) by mouth 2 (two) times daily. 12/19/18   Volanda Napoleon, PA-C  oseltamivir (TAMIFLU) 75 MG capsule Take 1 capsule (75 mg total) by mouth every 12 (twelve) hours. 05/12/17   Volanda Napoleon, PA-C  oxyCODONE-acetaminophen (PERCOCET) 10-325 MG tablet Take 1 tablet by mouth every 8 (eight) hours as needed for pain. 06/23/17   Sheard, Myeong O, DPM    Allergies    Patient has no known allergies.  Review of Systems   Review of Systems  Constitutional: Negative for chills and fever.  HENT: Negative for ear pain and sore throat.   Eyes: Negative for pain and visual disturbance.  Respiratory: Positive for cough. Negative for shortness of breath.   Cardiovascular: Negative for chest pain and palpitations.  Gastrointestinal: Negative for abdominal pain and vomiting.  Genitourinary: Negative for dysuria and hematuria.  Musculoskeletal: Negative for arthralgias and back pain.  Skin: Negative for color change and rash.  Neurological: Negative for seizures and syncope.  All other systems reviewed and are negative.   Physical Exam Updated Vital Signs BP (!) 152/97   Pulse 79   Temp 98.7 F (37.1 C) (Oral)   Resp 18   Ht 5\' 5"  (1.651 m)   Wt 88 kg   SpO2 98%   BMI 32.28 kg/m  Physical Exam Vitals and nursing note reviewed.  Constitutional:      General: She is not in acute distress.    Appearance: She is well-developed.  HENT:     Head: Normocephalic and atraumatic.  Eyes:     Conjunctiva/sclera: Conjunctivae normal.  Cardiovascular:     Rate and Rhythm: Normal rate and regular rhythm.     Heart sounds: No murmur.  Pulmonary:     Effort: Pulmonary effort is normal. No respiratory distress.     Breath sounds: Normal breath sounds.  Abdominal:     Palpations: Abdomen is soft.     Tenderness: There is no abdominal tenderness.  Musculoskeletal:        General: No swelling, tenderness or deformity.     Cervical back: Neck supple.  Skin:    General: Skin is warm  and dry.  Neurological:     General: No focal deficit present.     Mental Status: She is alert and oriented to person, place, and time.     ED Results / Procedures / Treatments   Labs (all labs ordered are listed, but only abnormal results are displayed) Labs Reviewed  SARS CORONAVIRUS 2 AG (30 MIN TAT)  NOVEL CORONAVIRUS, NAA (HOSP ORDER, SEND-OUT TO REF LAB; TAT 18-24 HRS)    EKG None  Radiology DG Chest Portable 1 View  Result Date: 05/02/2019 CLINICAL DATA:  Cough and shortness of breath EXAM: PORTABLE CHEST 1 VIEW COMPARISON:  None. FINDINGS: The heart size and mediastinal contours are within normal limits. Both lungs are clear. The visualized skeletal structures are unremarkable. IMPRESSION: No active disease. Electronically Signed   By: Prudencio Pair M.D.   On: 05/02/2019 16:34    Procedures Procedures (including critical care time)  Medications Ordered in ED Medications  oxyCODONE-acetaminophen (PERCOCET/ROXICET) 5-325 MG per tablet 2 tablet (2 tablets Oral Given 05/02/19 1651)    ED Course  I have reviewed the triage vital signs and the nursing notes.  Pertinent labs & imaging results that were available during my care of the patient were reviewed by me and considered in my medical decision making (see chart for details).    MDM Rules/Calculators/A&P                      49 year old lady with chief complaint of body aches.  On exam patient is remarkably well-appearing, noted history of asthma however her lungs are clear at this time.  She is in no respiratory distress.  CXR is negative for pneumonia.  POC Covid was negative however, still have fairly high clinical suspicion for Covid or other acute viral illness.  Will send for PCR.  Reviewed return precautions with patient, isolation precautions, will discharge home.    After the discussed management above, the patient was determined to be safe for discharge.  The patient was in agreement with this plan and all  questions regarding their care were answered.  ED return precautions were discussed and the patient will return to the ED with any significant worsening of condition.    Final Clinical Impression(s) / ED Diagnoses Final diagnoses:  Viral syndrome    Rx / DC Orders ED Discharge Orders    None       Lucrezia Starch, MD 05/02/19 1745

## 2019-05-03 LAB — NOVEL CORONAVIRUS, NAA (HOSP ORDER, SEND-OUT TO REF LAB; TAT 18-24 HRS): SARS-CoV-2, NAA: NOT DETECTED

## 2020-01-31 ENCOUNTER — Other Ambulatory Visit: Payer: Self-pay

## 2020-01-31 ENCOUNTER — Encounter (HOSPITAL_BASED_OUTPATIENT_CLINIC_OR_DEPARTMENT_OTHER): Payer: Self-pay | Admitting: *Deleted

## 2020-01-31 ENCOUNTER — Emergency Department (HOSPITAL_BASED_OUTPATIENT_CLINIC_OR_DEPARTMENT_OTHER)
Admission: EM | Admit: 2020-01-31 | Discharge: 2020-02-01 | Disposition: A | Discharge status: Home / Self Care | Payer: Medicaid Other | Attending: Emergency Medicine | Admitting: Emergency Medicine

## 2020-01-31 DIAGNOSIS — I110 Hypertensive heart disease with heart failure: Secondary | ICD-10-CM | POA: Diagnosis not present

## 2020-01-31 DIAGNOSIS — R079 Chest pain, unspecified: Secondary | ICD-10-CM | POA: Diagnosis not present

## 2020-01-31 DIAGNOSIS — I509 Heart failure, unspecified: Secondary | ICD-10-CM | POA: Diagnosis not present

## 2020-01-31 DIAGNOSIS — Z79899 Other long term (current) drug therapy: Secondary | ICD-10-CM | POA: Insufficient documentation

## 2020-01-31 DIAGNOSIS — J45909 Unspecified asthma, uncomplicated: Secondary | ICD-10-CM | POA: Diagnosis not present

## 2020-01-31 NOTE — ED Triage Notes (Signed)
C/o left sided chest pain x 2 hrs which radiates down left arm.

## 2020-01-31 NOTE — ED Notes (Signed)
Pt placed in a gown and is hooked up to the monitor

## 2020-02-01 LAB — BASIC METABOLIC PANEL
Anion gap: 8 (ref 5–15)
BUN: 16 mg/dL (ref 6–20)
CO2: 24 mmol/L (ref 22–32)
Calcium: 8.7 mg/dL — ABNORMAL LOW (ref 8.9–10.3)
Chloride: 103 mmol/L (ref 98–111)
Creatinine, Ser: 0.96 mg/dL (ref 0.44–1.00)
GFR, Estimated: >60 mL/min (ref >60)
Glucose, Bld: 96 mg/dL (ref 70–99)
Potassium: 4.7 mmol/L (ref 3.5–5.1)
Sodium: 135 mmol/L (ref 135–145)

## 2020-02-01 LAB — CBC
HCT: 36.2 % (ref 36.0–46.0)
Hemoglobin: 11.9 g/dL — ABNORMAL LOW (ref 12.0–15.0)
MCH: 32.1 pg (ref 26.0–34.0)
MCHC: 32.9 g/dL (ref 30.0–36.0)
MCV: 97.6 fL (ref 80.0–100.0)
Platelets: 291 10*3/uL (ref 150–400)
RBC: 3.71 MIL/uL — ABNORMAL LOW (ref 3.87–5.11)
RDW: 13.6 % (ref 11.5–15.5)
WBC: 4.9 10*3/uL (ref 4.0–10.5)
nRBC: 0 % (ref 0.0–0.2)

## 2020-02-01 LAB — TROPONIN I (HIGH SENSITIVITY): Troponin I (High Sensitivity): 3 ng/L (ref <18)

## 2020-02-01 NOTE — ED Provider Notes (Signed)
[facility] EMERGENCY DEPARTMENT Provider Note   CSN: [id number] Arrival date & time: 01/31/20  2301     History Chief Complaint  Patient presents with  . Chest Pain    Valerie Haney is a 49 y.o. female.  Patient is a 49 year old female with past medical history of CHF, hypertension, migraines.  She presents today for evaluation of chest pain.  This started while she was lying down getting ready to go to bed.  Started at approximately 9 PM.  She describes a pressure to the center of her chest with numbness of her fingertips.  She denies any shortness of breath, nausea, diaphoresis.  She denies any recent exertional symptoms.  Patient reports having had an arrhythmia in the past and wearing a heart monitor, but denies stress test or heart cath.  The history is provided by the patient.       Past Medical History:  Diagnosis Date  . Asthma   . Blood transfusion   . Blood transfusion without reported diagnosis   . Cardiac arrhythmia   . CHF (congestive heart failure) ([place])   . Hypertension   . Migraine     Patient Active Problem List   Diagnosis Date Noted  . 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 Pain in both feet 05/13/2016    Past Surgical History:  Procedure Laterality Date  . ABDOMINAL HYSTERECTOMY    . ORTHOPEDIC SURGERY    . TONSILLECTOMY       OB History   No obstetric history on file.     No family history on file.  Social History   Tobacco Use  . Smoking status: Never Smoker  . Smokeless tobacco: Never Used  Vaping Use  . Vaping Use: Never used  Substance Use Topics  . Alcohol use: No  . Drug use: No    Home Medications Prior to Admission medications   Medication Sig Start Date End Date Taking? Authorizing Provider  albuterol (PROVENTIL HFA;VENTOLIN HFA) 108 (90 Base) MCG/ACT inhaler Inhale 1-2 puffs into the lungs every 6 (six) hours as needed for wheezing or shortness of breath. 05/12/17   Volanda Napoleon, PA-C   benzonatate (TESSALON) 100 MG capsule Take 1 capsule (100 mg total) by mouth every 8 (eight) hours. 05/12/17   Volanda Napoleon, PA-C  methocarbamol (ROBAXIN) 500 MG tablet Take 1 tablet (500 mg total) by mouth 2 (two) times daily. 12/19/18   Volanda Napoleon, PA-C  oseltamivir (TAMIFLU) 75 MG capsule Take 1 capsule (75 mg total) by mouth every 12 (twelve) hours. 05/12/17   Volanda Napoleon, PA-C  oxyCODONE-acetaminophen (PERCOCET) 10-325 MG tablet Take 1 tablet by mouth every 8 (eight) hours as needed for pain. 06/23/17   Sheard, Myeong O, DPM    Allergies    Patient has no known allergies.  Review of Systems   Review of Systems  All other systems reviewed and are negative.   Physical Exam Updated Vital Signs BP (!) 187/133   Pulse 62   Temp 97.9 F (36.6 C) (Oral)   Resp 15   Ht 5\' 5"  (1.651 m)   Wt 84.8 kg   SpO2 97%   BMI 31.12 kg/m   Physical Exam Vitals and nursing note reviewed.  Constitutional:      General: She is not in acute distress.    Appearance: She is well-developed. She is not diaphoretic.  HENT:     Head: Normocephalic and atraumatic.  Cardiovascular:     Rate  and Rhythm: Normal rate and regular rhythm.     Heart sounds: No murmur heard.  No friction rub. No gallop.   Pulmonary:     Effort: Pulmonary effort is normal. No respiratory distress.     Breath sounds: Normal breath sounds. No wheezing.  Abdominal:     General: Bowel sounds are normal. There is no distension.     Palpations: Abdomen is soft.     Tenderness: There is no abdominal tenderness.  Musculoskeletal:        General: Normal range of motion.     Cervical back: Normal range of motion and neck supple.     Right lower leg: No tenderness. No edema.     Left lower leg: No tenderness. No edema.     Comments: Bevelyn Buckles' sign is absent bilaterally.  Skin:    General: Skin is warm and dry.  Neurological:     Mental Status: She is alert and oriented to person, place, and time.     ED  Results / Procedures / Treatments   Labs (all labs ordered are listed, but only abnormal results are displayed) Labs Reviewed - No data to display  EKG EKG Interpretation  Date/Time:  Thursday January 31 2020 23:16:18 EDT Ventricular Rate:  61 PR Interval:    QRS Duration: 88 QT Interval:  402 QTC Calculation: 405 R Axis:   -25 Text Interpretation: Sinus rhythm Abnormal R-wave progression, early transition LVH with secondary repolarization abnormality No significant change since 10/16/2014 Confirmed by Veryl Speak [phone]) on 02/01/2020 1:22:22 AM   Radiology No results found.  Procedures Procedures (including critical care time)  Medications Ordered in ED Medications - No data to display  ED Course  I have reviewed the triage vital signs and the nursing notes.  Pertinent labs & imaging results that were available during my care of the patient were reviewed by me and considered in my medical decision making (see chart for details).    MDM Rules/Calculators/A&P  Patient is a 49 year old female presenting with complaints of chest discomfort, the details of which are described in the HPI.  Patient symptoms are atypical for cardiac pain.  Her work-up is unremarkable including EKG and troponin.  I see no indication for admission, but will advise the patient to follow-up with cardiology to discuss a stress test.  If she worsens in the meantime, she is to return to the ER to be reevaluated.  Final Clinical Impression(s) / ED Diagnoses Final diagnoses:  None    Rx / DC Orders ED Discharge Orders    None       Veryl Speak, MD 02/01/20 [phone]

## 2020-02-01 NOTE — Discharge Instructions (Addendum)
Begin taking ibuprofen 600 mg every 6 hours as needed for pain.  If symptoms or not improving in the next few days, follow-up with cardiology to discuss a stress test.  The contact information for the [facility] cardiology clinic has been provided in this discharge summary for you to call and make these arrangements.  Return to the emergency department in the meantime if you develop worsening pain, difficulty breathing, high fever, or other new and concerning symptoms.

## 2020-10-06 ENCOUNTER — Other Ambulatory Visit: Payer: Self-pay

## 2020-10-06 ENCOUNTER — Encounter (HOSPITAL_BASED_OUTPATIENT_CLINIC_OR_DEPARTMENT_OTHER): Payer: Self-pay | Admitting: Emergency Medicine

## 2020-10-06 ENCOUNTER — Emergency Department (HOSPITAL_BASED_OUTPATIENT_CLINIC_OR_DEPARTMENT_OTHER): Payer: Medicaid Other

## 2020-10-06 ENCOUNTER — Emergency Department (HOSPITAL_BASED_OUTPATIENT_CLINIC_OR_DEPARTMENT_OTHER)
Admission: EM | Admit: 2020-10-06 | Discharge: 2020-10-06 | Disposition: A | Discharge status: Home / Self Care | Payer: Medicaid Other | Attending: Emergency Medicine | Admitting: Emergency Medicine

## 2020-10-06 DIAGNOSIS — Z20822 Contact with and (suspected) exposure to covid-19: Secondary | ICD-10-CM | POA: Diagnosis not present

## 2020-10-06 DIAGNOSIS — I110 Hypertensive heart disease with heart failure: Secondary | ICD-10-CM | POA: Insufficient documentation

## 2020-10-06 DIAGNOSIS — R059 Cough, unspecified: Secondary | ICD-10-CM | POA: Diagnosis not present

## 2020-10-06 DIAGNOSIS — I509 Heart failure, unspecified: Secondary | ICD-10-CM | POA: Diagnosis not present

## 2020-10-06 DIAGNOSIS — J45909 Unspecified asthma, uncomplicated: Secondary | ICD-10-CM | POA: Diagnosis not present

## 2020-10-06 DIAGNOSIS — R072 Precordial pain: Secondary | ICD-10-CM | POA: Diagnosis not present

## 2020-10-06 DIAGNOSIS — R030 Elevated blood-pressure reading, without diagnosis of hypertension: Secondary | ICD-10-CM

## 2020-10-06 HISTORY — DX: Cerebral infarction, unspecified: I63.9

## 2020-10-06 LAB — BASIC METABOLIC PANEL
Anion gap: 9 (ref 5–15)
BUN: 11 mg/dL (ref 6–20)
CO2: 25 mmol/L (ref 22–32)
Calcium: 9.3 mg/dL (ref 8.9–10.3)
Chloride: 104 mmol/L (ref 98–111)
Creatinine, Ser: 0.88 mg/dL (ref 0.44–1.00)
GFR, Estimated: >60 mL/min (ref >60)
Glucose, Bld: 136 mg/dL — ABNORMAL HIGH (ref 70–99)
Potassium: 3.9 mmol/L (ref 3.5–5.1)
Sodium: 138 mmol/L (ref 135–145)

## 2020-10-06 LAB — RESP PANEL BY RT-PCR (FLU A&B, COVID) ARPGX2
Influenza A by PCR: NEGATIVE
Influenza B by PCR: NEGATIVE
SARS Coronavirus 2 by RT PCR: NEGATIVE

## 2020-10-06 LAB — CBC
HCT: 40.4 % (ref 36.0–46.0)
Hemoglobin: 13.5 g/dL (ref 12.0–15.0)
MCH: 32.2 pg (ref 26.0–34.0)
MCHC: 33.4 g/dL (ref 30.0–36.0)
MCV: 96.4 fL (ref 80.0–100.0)
Platelets: 362 10*3/uL (ref 150–400)
RBC: 4.19 MIL/uL (ref 3.87–5.11)
RDW: 13.7 % (ref 11.5–15.5)
WBC: 4 10*3/uL (ref 4.0–10.5)
nRBC: 0 % (ref 0.0–0.2)

## 2020-10-06 LAB — TROPONIN I (HIGH SENSITIVITY)
Troponin I (High Sensitivity): 3 ng/L (ref <18)
Troponin I (High Sensitivity): 3 ng/L (ref <18)

## 2020-10-06 MED ORDER — ALUM & MAG HYDROXIDE-SIMETH 200-200-20 MG/5ML PO SUSP
30.0000 mL | Freq: Once | ORAL | Status: AC
  Administered 2020-10-06: 30 mL via ORAL
  Filled 2020-10-06: qty 30

## 2020-10-06 MED ORDER — KETOROLAC TROMETHAMINE 30 MG/ML IJ SOLN
15.0000 mg | Freq: Once | INTRAMUSCULAR | Status: AC
  Administered 2020-10-06: 15 mg via INTRAVENOUS
  Filled 2020-10-06: qty 1

## 2020-10-06 MED ORDER — NAPROXEN 375 MG PO TABS: 375.0000 mg | ORAL_TABLET | Freq: Two times a day (BID) | ORAL | 0 refills | Status: DC

## 2020-10-06 MED ORDER — IOHEXOL 350 MG/ML SOLN
100.0000 mL | Freq: Once | INTRAVENOUS | Status: AC | PRN
  Administered 2020-10-06: 100 mL via INTRAVENOUS

## 2020-10-06 MED ORDER — ACETAMINOPHEN 500 MG PO TABS
1000.0000 mg | ORAL_TABLET | Freq: Once | ORAL | Status: AC
  Administered 2020-10-06: 1000 mg via ORAL
  Filled 2020-10-06: qty 2

## 2020-10-06 MED ORDER — DEXAMETHASONE SODIUM PHOSPHATE 4 MG/ML IJ SOLN
4.0000 mg | Freq: Once | INTRAMUSCULAR | Status: AC
  Administered 2020-10-06: 4 mg via INTRAVENOUS
  Filled 2020-10-06: qty 1

## 2020-10-06 NOTE — ED Provider Notes (Signed)
[facility] EMERGENCY DEPARTMENT Provider Note   CSN: [id number] Arrival date & time: 10/06/20  1431     History Chief Complaint  Patient presents with   Chest Pain    Valerie Haney is a 50 y.o. female.  The history is provided by the patient.  Chest Pain Pain location:  L chest Pain quality: sharp   Pain radiates to:  Does not radiate Pain severity:  Moderate Onset quality:  Gradual Duration:  1 day Timing:  Constant Progression:  Unchanged Chronicity:  Recurrent Context: at rest   Relieved by:  Nothing Worsened by:  Nothing Ineffective treatments:  None tried Associated symptoms: cough   Associated symptoms: no abdominal pain, no AICD problem, no altered mental status, no anorexia, no anxiety, no back pain, no claudication, no diaphoresis, no dizziness, no dysphagia, no fatigue, no fever, no headache, no heartburn, no lower extremity edema, no nausea, no near-syncope, no numbness, no orthopnea, no palpitations, no PND, no shortness of breath, no syncope, no vomiting and no weakness       Past Medical History:  Diagnosis Date   Asthma    Blood transfusion    Blood transfusion without reported diagnosis    Cardiac arrhythmia    CHF (congestive heart failure) ([place])    Hypertension    Migraine    Stroke [facility])     Patient Active Problem List   Diagnosis Date Noted   Contracture of Achilles tendon, 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     History reviewed. No pertinent family history.  Social History   Tobacco Use   Smoking status: Never   Smokeless tobacco: Never  Vaping Use   Vaping Use: Never used  Substance Use Topics   Alcohol use: No   Drug use: No    Home Medications Prior to Admission medications   Medication Sig Start Date End Date Taking?  Authorizing Provider  naproxen (NAPROSYN) 375 MG tablet Take 1 tablet (375 mg total) by mouth 2 (two) times daily with a meal. 10/06/20  Yes Merril Nagy, MD  albuterol (PROVENTIL HFA;VENTOLIN HFA) 108 (90 Base) MCG/ACT inhaler Inhale 1-2 puffs into the lungs every 6 (six) hours as needed for wheezing or shortness of breath. 05/12/17   Volanda Napoleon, PA-C  benzonatate (TESSALON) 100 MG capsule Take 1 capsule (100 mg total) by mouth every 8 (eight) hours. 05/12/17   Volanda Napoleon, PA-C  methocarbamol (ROBAXIN) 500 MG tablet Take 1 tablet (500 mg total) by mouth 2 (two) times daily. 12/19/18   Volanda Napoleon, PA-C  oseltamivir (TAMIFLU) 75 MG capsule Take 1 capsule (75 mg total) by mouth every 12 (twelve) hours. 05/12/17   Volanda Napoleon, PA-C  oxyCODONE-acetaminophen (PERCOCET) 10-325 MG tablet Take 1 tablet by mouth every 8 (eight) hours as needed for pain. 06/23/17   Sheard, Myeong O, DPM    Allergies    Patient has no known allergies.  Review of Systems   Review of Systems  Constitutional:  Negative for diaphoresis, fatigue and fever.  HENT:  Negative for facial swelling and trouble swallowing.   Eyes:  Negative for redness.  Respiratory:  Positive for cough. Negative for shortness of breath.   Cardiovascular:  Positive for chest pain. Negative  for palpitations, orthopnea, claudication, syncope, PND and near-syncope.  Gastrointestinal:  Negative for abdominal pain, anorexia, heartburn, nausea and vomiting.  Genitourinary:  Negative for difficulty urinating.  Musculoskeletal:  Negative for back pain.  Neurological:  Negative for dizziness, weakness, numbness and headaches.  Psychiatric/Behavioral:  Negative for agitation.   All other systems reviewed and are negative.  Physical Exam Updated Vital Signs BP (!) 157/97   Pulse 66   Temp 98.6 F (37 C) (Oral)   Resp 13   Ht 5\' 5"  (1.651 m)   Wt 84.8 kg   SpO2 97%   BMI 31.12 kg/m   Physical Exam Vitals and nursing note  reviewed.  Constitutional:      General: She is not in acute distress.    Appearance: Normal appearance.  HENT:     Head: Normocephalic and atraumatic.     Nose: Nose normal.  Eyes:     Conjunctiva/sclera: Conjunctivae normal.     Pupils: Pupils are equal, round, and reactive to light.  Cardiovascular:     Rate and Rhythm: Normal rate and regular rhythm.     Pulses: Normal pulses.     Heart sounds: Normal heart sounds.  Pulmonary:     Effort: Pulmonary effort is normal.     Breath sounds: Normal breath sounds.  Abdominal:     General: Abdomen is flat. Bowel sounds are normal.     Palpations: Abdomen is soft.     Tenderness: There is no abdominal tenderness. There is no guarding.  Musculoskeletal:        General: Normal range of motion.     Cervical back: Normal range of motion and neck supple.  Skin:    General: Skin is warm and dry.     Capillary Refill: Capillary refill takes less than 2 seconds.  Neurological:     General: No focal deficit present.     Mental Status: She is alert and oriented to person, place, and time.     Deep Tendon Reflexes: Reflexes normal.  Psychiatric:        Mood and Affect: Mood normal.        Behavior: Behavior normal.    ED Results / Procedures / Treatments   Labs (all labs ordered are listed, but only abnormal results are displayed) Results for orders placed or performed during the hospital encounter of 10/06/20  Resp Panel by RT-PCR (Flu A&B, Covid) Nasopharyngeal Swab   Specimen: Nasopharyngeal Swab; Nasopharyngeal(NP) swabs in vial transport medium  Result Value Ref Range   SARS Coronavirus 2 by RT PCR NEGATIVE NEGATIVE   Influenza A by PCR NEGATIVE NEGATIVE   Influenza B by PCR NEGATIVE NEGATIVE  Basic metabolic panel  Result Value Ref Range   Sodium 138 135 - 145 mmol/L   Potassium 3.9 3.5 - 5.1 mmol/L   Chloride 104 98 - 111 mmol/L   CO2 25 22 - 32 mmol/L   Glucose, Bld 136 (H) 70 - 99 mg/dL   BUN 11 6 - 20 mg/dL    Creatinine, Ser 0.88 0.44 - 1.00 mg/dL   Calcium 9.3 8.9 - 10.3 mg/dL   GFR, Estimated >60 >60 mL/min   Anion gap 9 5 - 15  CBC  Result Value Ref Range   WBC 4.0 4.0 - 10.5 K/uL   RBC 4.19 3.87 - 5.11 MIL/uL   Hemoglobin 13.5 12.0 - 15.0 g/dL   HCT 40.4 36.0 - 46.0 %   MCV 96.4 80.0 - 100.0 fL   MCH  32.2 26.0 - 34.0 pg   MCHC 33.4 30.0 - 36.0 g/dL   RDW 13.7 11.5 - 15.5 %   Platelets 362 150 - 400 K/uL   nRBC 0.0 0.0 - 0.2 %  Troponin I (High Sensitivity)  Result Value Ref Range   Troponin I (High Sensitivity) 3 <18 ng/L  Troponin I (High Sensitivity)  Result Value Ref Range   Troponin I (High Sensitivity) 3 <18 ng/L   CT Angio Chest PE W and/or Wo Contrast  Result Date: 10/06/2020 CLINICAL DATA:  Left-sided chest pain and cough. EXAM: CT ANGIOGRAPHY CHEST WITH CONTRAST TECHNIQUE: Multidetector CT imaging of the chest was performed using the standard protocol during bolus administration of intravenous contrast. Multiplanar CT image reconstructions and MIPs were obtained to evaluate the vascular anatomy. CONTRAST:  124mL OMNIPAQUE IOHEXOL 350 MG/ML SOLN COMPARISON:  Chest radiograph 05/02/2019 FINDINGS: Cardiovascular: No filling defect is identified in the pulmonary arterial tree to suggest pulmonary embolus. Mild cardiomegaly. Mediastinum/Nodes: Unremarkable Lungs/Pleura: Unremarkable Upper Abdomen: Unremarkable Musculoskeletal: Unremarkable Review of the MIP images confirms the above findings. IMPRESSION: 1. Mild cardiomegaly.  Otherwise unremarkable. Electronically Signed   By: Van Clines M.D.   On: 10/06/2020 16:16    EKG Interpretation  Date/Time:  Monday October 06 2020 14:41:09 EDT Ventricular Rate:  81 PR Interval:  134 QRS Duration: 86 QT Interval:  374 QTC Calculation: 434 R Axis:   -27 Text Interpretation: Normal sinus rhythm Minimal voltage criteria for LVH, may be normal variant ( R in aVL ) T wave abnormality, consider inferolateral ischemia No significant  change since prior 11/21 Confirmed by Aletta Edouard [phone]) on 10/06/2020 2:44:16 PM  Radiology CT Angio Chest PE W and/or Wo Contrast  Result Date: 10/06/2020 CLINICAL DATA:  Left-sided chest pain and cough. EXAM: CT ANGIOGRAPHY CHEST WITH CONTRAST TECHNIQUE: Multidetector CT imaging of the chest was performed using the standard protocol during bolus administration of intravenous contrast. Multiplanar CT image reconstructions and MIPs were obtained to evaluate the vascular anatomy. CONTRAST:  1107mL OMNIPAQUE IOHEXOL 350 MG/ML SOLN COMPARISON:  Chest radiograph 05/02/2019 FINDINGS: Cardiovascular: No filling defect is identified in the pulmonary arterial tree to suggest pulmonary embolus. Mild cardiomegaly. Mediastinum/Nodes: Unremarkable Lungs/Pleura: Unremarkable Upper Abdomen: Unremarkable Musculoskeletal: Unremarkable Review of the MIP images confirms the above findings. IMPRESSION: 1. Mild cardiomegaly.  Otherwise unremarkable. Electronically Signed   By: Van Clines M.D.   On: 10/06/2020 16:16    Procedures Procedures   Medications Ordered in ED Medications  dexamethasone (DECADRON) injection 4 mg (has no administration in time range)  ketorolac (TORADOL) 30 MG/ML injection 15 mg (15 mg Intravenous Given 10/06/20 1523)  iohexol (OMNIPAQUE) 350 MG/ML injection 100 mL (100 mLs Intravenous Contrast Given 10/06/20 1532)  alum & mag hydroxide-simeth (MAALOX/MYLANTA) 200-200-20 MG/5ML suspension 30 mL (30 mLs Oral Given 10/06/20 1640)  acetaminophen (TYLENOL) tablet 1,000 mg (1,000 mg Oral Given 10/06/20 1640)    ED Course  I have reviewed the triage vital signs and the nursing notes.  Pertinent labs & imaging results that were available during my care of the patient were reviewed by me and considered in my medical decision making (see chart for details).    Pain is atypical for cardiac etiology.  Ruled out for MI with 2 negative troponins and unchanged EKG.  Heart score 1 one low risk  for MACE>  Ruled out for PE with negative CTA>  Informed of need for follow up due to BP.   Patient verbalizes understanding and agrees to follow  up.  Scott Fix was evaluated in Emergency Department on 10/06/2020 for the symptoms described in the history of present illness. She was evaluated in the context of the global COVID-19 pandemic, which necessitated consideration that the patient might be at risk for infection with the SARS-CoV-2 virus that causes COVID-19. Institutional protocols and algorithms that pertain to the evaluation of patients at risk for COVID-19 are in a state of rapid change based on information released by regulatory bodies including the CDC and federal and state organizations. These policies and algorithms were followed during the patient's care in the ED.  Final Clinical Impression(s) / ED Diagnoses Final diagnoses:  Precordial pain  Elevated blood pressure reading   Return for intractable cough, coughing up blood, fevers > 100.4 unrelieved by medication, shortness of breath, intractable vomiting, chest pain, shortness of breath, weakness, numbness, changes in speech, facial asymmetry, abdominal pain, passing out, Inability to tolerate liquids or food, cough, altered mental status or any concerns. No signs of systemic illness or infection. The patient is nontoxic-appearing on exam and vital signs are within normal limits. I have reviewed the triage vital signs and the nursing notes. Pertinent labs & imaging results that were available during my care of the patient were reviewed by me and considered in my medical decision making (see chart for details). After history, exam, and medical workup I feel the patient has been appropriately medically screened and is safe for discharge home. Pertinent diagnoses were discussed with the patient. Patient was given return precautions.  Rx / DC Orders ED Discharge Orders          Ordered    naproxen (NAPROSYN) 375 MG tablet  2 times daily  with meals        10/06/20 1739             Ayvin Lipinski, MD 10/06/20 1746
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
## 2020-11-12 ENCOUNTER — Ambulatory Visit: Payer: Medicaid Other | Admitting: Neurology

## 2020-11-12 ENCOUNTER — Encounter: Payer: Self-pay | Admitting: Neurology

## 2020-11-12 VITALS — BP 154/93 | HR 72 | Ht 65.0 in | Wt 202.0 lb

## 2020-11-12 DIAGNOSIS — G40301 Generalized idiopathic epilepsy and epileptic syndromes, not intractable, with status epilepticus: Secondary | ICD-10-CM

## 2020-11-12 MED ORDER — PERAMPANEL 2 MG PO TABS: 4.0000 mg | ORAL_TABLET | Freq: Every day | ORAL | 0 refills | Status: AC
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
## 2020-11-12 NOTE — Patient Instructions (Signed)
You have prescribed a new medication called Fycompa  Week 1: Continue with Depakote 3 tablets AM and 3 tablets and night and start Fycompa 1 tablet at night  Week 2: Depakote 3 tablets in the AM and Fycompa 2 tablets at night  Week 3: Discontinue Depakote and continue with Fycoma 2 tablets at night  Return to clinic in 4 weeks

## 2020-11-13 ENCOUNTER — Telehealth: Payer: Self-pay | Admitting: Neurology

## 2020-12-09 NOTE — Telephone Encounter (Signed)
ERROR

## 2020-12-10 ENCOUNTER — Ambulatory Visit: Payer: Medicaid Other | Admitting: Neurology

## 2020-12-11 ENCOUNTER — Encounter: Payer: Self-pay | Admitting: Neurology

## 2020-12-29 ENCOUNTER — Encounter: Payer: Self-pay | Admitting: Neurology

## 2020-12-29 ENCOUNTER — Ambulatory Visit: Payer: Medicaid Other | Admitting: Neurology

## 2020-12-29 VITALS — BP 170/98 | HR 67 | Wt 196.0 lb

## 2020-12-29 DIAGNOSIS — G40301 Generalized idiopathic epilepsy and epileptic syndromes, not intractable, with status epilepticus: Secondary | ICD-10-CM | POA: Diagnosis not present

## 2020-12-29 MED ORDER — FYCOMPA 6 MG PO TABS: 6.0000 mg | ORAL_TABLET | Freq: Every evening | ORAL | 0 refills | Status: DC

## 2020-12-29 NOTE — Patient Instructions (Signed)
Increase Fycompa to 6 mg nightly  Return to clinic in 4 weeks
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
## 2021-01-09 ENCOUNTER — Other Ambulatory Visit: Payer: Self-pay

## 2021-01-09 ENCOUNTER — Encounter (HOSPITAL_BASED_OUTPATIENT_CLINIC_OR_DEPARTMENT_OTHER): Payer: Self-pay | Admitting: *Deleted

## 2021-01-09 DIAGNOSIS — J45909 Unspecified asthma, uncomplicated: Secondary | ICD-10-CM | POA: Diagnosis not present

## 2021-01-09 DIAGNOSIS — I110 Hypertensive heart disease with heart failure: Secondary | ICD-10-CM | POA: Insufficient documentation

## 2021-01-09 DIAGNOSIS — I509 Heart failure, unspecified: Secondary | ICD-10-CM | POA: Insufficient documentation

## 2021-01-09 DIAGNOSIS — W228XXA Striking against or struck by other objects, initial encounter: Secondary | ICD-10-CM | POA: Insufficient documentation

## 2021-01-09 DIAGNOSIS — S91202A Unspecified open wound of left great toe with damage to nail, initial encounter: Secondary | ICD-10-CM | POA: Diagnosis not present

## 2021-01-09 DIAGNOSIS — Z79899 Other long term (current) drug therapy: Secondary | ICD-10-CM | POA: Diagnosis not present

## 2021-01-09 DIAGNOSIS — S99922A Unspecified injury of left foot, initial encounter: Secondary | ICD-10-CM | POA: Diagnosis present

## 2021-01-09 NOTE — ED Triage Notes (Signed)
Left great toenail partially pulled off after she struck door.  Bleeding controlled.  Last tetanus unknown.

## 2021-01-10 ENCOUNTER — Encounter (HOSPITAL_BASED_OUTPATIENT_CLINIC_OR_DEPARTMENT_OTHER): Payer: Self-pay | Admitting: Emergency Medicine

## 2021-01-10 ENCOUNTER — Emergency Department (HOSPITAL_BASED_OUTPATIENT_CLINIC_OR_DEPARTMENT_OTHER)
Admission: EM | Admit: 2021-01-10 | Discharge: 2021-01-10 | Disposition: A | Discharge status: Home / Self Care | Payer: Medicaid Other | Attending: Emergency Medicine | Admitting: Emergency Medicine

## 2021-01-10 DIAGNOSIS — S91209A Unspecified open wound of unspecified toe(s) with damage to nail, initial encounter: Secondary | ICD-10-CM

## 2021-01-10 MED ORDER — HYDROMORPHONE HCL 1 MG/ML IJ SOLN
2.0000 mg | Freq: Once | INTRAMUSCULAR | Status: AC
  Administered 2021-01-10: 2 mg via INTRAMUSCULAR
  Filled 2021-01-10: qty 2

## 2021-01-10 MED ORDER — CEPHALEXIN 500 MG PO CAPS: 500.0000 mg | ORAL_CAPSULE | Freq: Four times a day (QID) | ORAL | 0 refills | Status: DC

## 2021-01-10 MED ORDER — CEPHALEXIN 250 MG PO CAPS
500.0000 mg | ORAL_CAPSULE | Freq: Once | ORAL | Status: AC
  Administered 2021-01-10: 500 mg via ORAL
  Filled 2021-01-10: qty 2
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
## 2021-01-26 ENCOUNTER — Encounter: Payer: Self-pay | Admitting: Neurology

## 2021-01-26 ENCOUNTER — Ambulatory Visit: Payer: Medicaid Other | Admitting: Neurology

## 2021-01-26 VITALS — BP 172/100 | HR 61 | Ht 65.0 in | Wt 200.0 lb

## 2021-01-26 DIAGNOSIS — G40301 Generalized idiopathic epilepsy and epileptic syndromes, not intractable, with status epilepticus: Secondary | ICD-10-CM | POA: Diagnosis not present

## 2021-01-26 MED ORDER — FYCOMPA 8 MG PO TABS: 8.0000 mg | ORAL_TABLET | Freq: Every day | ORAL | 3 refills | Status: DC

## 2021-01-26 NOTE — Patient Instructions (Signed)
Increase Fycompa to 8 mg nightly  Routine EEG Return to clinic in 6 weeks
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
## 2021-02-02 ENCOUNTER — Ambulatory Visit: Payer: Medicaid Other | Admitting: Neurology

## 2021-02-02 DIAGNOSIS — G40301 Generalized idiopathic epilepsy and epileptic syndromes, not intractable, with status epilepticus: Secondary | ICD-10-CM

## 2021-02-03 NOTE — Procedures (Signed)
    History:  19 yof with generalized epilepsy, recent nocturnal seizure.   EEG classification:  Awake and asleep  Description of the recording: The background rhythms of this recording consists of a fairly well modulated medium amplitude background activity of 5-7 Hz. As the record progresses, the patient initially is in the waking state, but appears to enter the early stage II sleep during the recording, with rudimentary sleep spindles and vertex sharp wave activity seen. During the wakeful state, photic stimulation is performed, and no abnormal responses were seen. Hyperventilation was not performed. No epileptiform discharges seen during this recording. There was mild diffuse slowing and no focal slowing. EKG monitor shows no evidence of cardiac rhythm abnormalities with a heart rate of 66.  Impression: This is an abnormal EEG recording in the waking and sleeping state due to mild diffuse slowing. No evidence interictal epileptiform discharges were seen at any time during the recording.   Mild diffuse slowing is consistent with a generalized brain dysfunction.    Alric Ran, MD [facility]

## 2021-03-12 ENCOUNTER — Ambulatory Visit: Payer: Medicaid Other | Admitting: Neurology

## 2021-03-31 ENCOUNTER — Ambulatory Visit: Payer: Medicaid Other | Admitting: Neurology

## 2021-03-31 ENCOUNTER — Encounter: Payer: Self-pay | Admitting: Neurology

## 2021-03-31 VITALS — BP 157/96 | HR 60 | Ht 65.0 in | Wt 194.5 lb

## 2021-03-31 DIAGNOSIS — G40301 Generalized idiopathic epilepsy and epileptic syndromes, not intractable, with status epilepticus: Secondary | ICD-10-CM | POA: Diagnosis not present

## 2021-03-31 DIAGNOSIS — F32A Depression, unspecified: Secondary | ICD-10-CM

## 2021-03-31 MED ORDER — OXCARBAZEPINE ER 600 MG PO TB24: 600.0000 mg | ORAL_TABLET | Freq: Every day | ORAL | 0 refills | Status: DC

## 2021-03-31 NOTE — Patient Instructions (Addendum)
Discontinue Fycompa, possible side effects of depression  Will start patient on Trileptal 600 mg daily  Referral to psychiatry  Return to clinic in 6 to 8 weeks
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
## 2021-05-14 ENCOUNTER — Other Ambulatory Visit: Payer: Self-pay

## 2021-05-14 ENCOUNTER — Encounter: Payer: Self-pay | Admitting: Neurology

## 2021-05-14 ENCOUNTER — Ambulatory Visit: Payer: Medicaid Other | Admitting: Neurology

## 2021-05-14 VITALS — BP 170/107 | HR 65 | Ht 65.0 in | Wt 199.5 lb

## 2021-05-14 DIAGNOSIS — G40301 Generalized idiopathic epilepsy and epileptic syndromes, not intractable, with status epilepticus: Secondary | ICD-10-CM

## 2021-05-14 DIAGNOSIS — Z5181 Encounter for therapeutic drug level monitoring: Secondary | ICD-10-CM | POA: Diagnosis not present

## 2021-05-14 MED ORDER — OXCARBAZEPINE ER 600 MG PO TB24: 600.0000 mg | ORAL_TABLET | Freq: Every day | ORAL | 4 refills | Status: DC
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
## 2021-05-14 NOTE — Patient Instructions (Signed)
Continue with Trileptal 600 mg daily  Will check BMP and Trileptal level today  Follow up in 3 months or sooner if worse

## 2021-05-19 LAB — BASIC METABOLIC PANEL
BUN/Creatinine Ratio: 16 (ref 9–23)
BUN: 15 mg/dL (ref 6–24)
CO2: 22 mmol/L (ref 20–29)
Calcium: 9.4 mg/dL (ref 8.7–10.2)
Chloride: 101 mmol/L (ref 96–106)
Creatinine, Ser: 0.95 mg/dL (ref 0.57–1.00)
Glucose: 88 mg/dL (ref 70–99)
Potassium: 3.4 mmol/L — ABNORMAL LOW (ref 3.5–5.2)
Sodium: 136 mmol/L (ref 134–144)
eGFR: 72 mL/min/{1.73_m2} (ref >59)

## 2021-05-19 LAB — 10-HYDROXYCARBAZEPINE: Oxcarbazepine SerPl-Mcnc: <1 ug/mL — ABNORMAL LOW (ref 10–35)

## 2021-07-04 ENCOUNTER — Other Ambulatory Visit: Payer: Self-pay

## 2021-07-04 ENCOUNTER — Encounter (HOSPITAL_BASED_OUTPATIENT_CLINIC_OR_DEPARTMENT_OTHER): Payer: Self-pay | Admitting: Emergency Medicine

## 2021-07-04 ENCOUNTER — Emergency Department (HOSPITAL_BASED_OUTPATIENT_CLINIC_OR_DEPARTMENT_OTHER)
Admission: EM | Admit: 2021-07-04 | Discharge: 2021-07-04 | Disposition: A | Discharge status: Home / Self Care | Payer: Medicaid Other | Attending: Emergency Medicine | Admitting: Emergency Medicine

## 2021-07-04 ENCOUNTER — Emergency Department (HOSPITAL_BASED_OUTPATIENT_CLINIC_OR_DEPARTMENT_OTHER): Payer: Medicaid Other

## 2021-07-04 DIAGNOSIS — S93402A Sprain of unspecified ligament of left ankle, initial encounter: Secondary | ICD-10-CM

## 2021-07-04 DIAGNOSIS — Z79899 Other long term (current) drug therapy: Secondary | ICD-10-CM | POA: Insufficient documentation

## 2021-07-04 DIAGNOSIS — W182XXA Fall in (into) shower or empty bathtub, initial encounter: Secondary | ICD-10-CM | POA: Diagnosis not present

## 2021-07-04 DIAGNOSIS — S99912A Unspecified injury of left ankle, initial encounter: Secondary | ICD-10-CM | POA: Diagnosis present

## 2021-07-04 MED ORDER — IBUPROFEN 800 MG PO TABS
800.0000 mg | ORAL_TABLET | Freq: Once | ORAL | Status: AC
  Administered 2021-07-04: 800 mg via ORAL
  Filled 2021-07-04: qty 1
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
## 2021-07-04 NOTE — ED Triage Notes (Signed)
Pt states she slipped getting out of the shower and hurt her left ankle  Pt state it hurts to bend her ankle    ?

## 2021-07-14 ENCOUNTER — Ambulatory Visit (HOSPITAL_COMMUNITY): Payer: Medicaid Other | Admitting: Licensed Clinical Social Worker

## 2021-07-16 ENCOUNTER — Ambulatory Visit (HOSPITAL_COMMUNITY): Payer: Medicaid Other | Admitting: Clinical

## 2021-08-13 ENCOUNTER — Ambulatory Visit: Payer: Medicaid Other | Admitting: Neurology

## 2021-08-13 ENCOUNTER — Encounter: Payer: Self-pay | Admitting: Neurology

## 2021-08-13 VITALS — BP 172/115 | HR 76 | Ht 65.0 in | Wt 197.0 lb

## 2021-08-13 DIAGNOSIS — T783XXS Angioneurotic edema, sequela: Secondary | ICD-10-CM

## 2021-08-13 DIAGNOSIS — G40301 Generalized idiopathic epilepsy and epileptic syndromes, not intractable, with status epilepticus: Secondary | ICD-10-CM | POA: Diagnosis not present

## 2021-08-13 MED ORDER — NEBIVOLOL HCL 5 MG PO TABS: 5.0000 mg | ORAL_TABLET | Freq: Every day | ORAL | 11 refills | Status: DC

## 2021-08-13 MED ORDER — OXCARBAZEPINE ER 600 MG PO TB24: 600.0000 mg | ORAL_TABLET | Freq: Every day | ORAL | 4 refills | Status: DC

## 2021-08-13 NOTE — Patient Instructions (Addendum)
Continue with oxcarbazepine XR 600 mg daily Start with Bystolic 5 mg daily, follow-up with your primary care Dr. Hubbard Robinson for management of high blood pressure Follow-up in 3 months
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
## 2021-08-17 ENCOUNTER — Encounter: Payer: Self-pay | Admitting: *Deleted

## 2021-08-17 ENCOUNTER — Telehealth: Payer: Self-pay | Admitting: *Deleted

## 2021-08-17 NOTE — Telephone Encounter (Signed)
I will defer it to her PMD. I will sent him a message. Thanks

## 2021-08-17 NOTE — Telephone Encounter (Signed)
I attempted to reach patient three times this afternoon. Rings repeatedly before getting a message that states "your call cannot be completed at this time." Not able to leave a message. She is not active on mychart. I will mail letter letting her know that she needs to follow up with her PCP to discuss treatment for BP. I will also communicate this with the pharmacy.

## 2021-08-17 NOTE — Telephone Encounter (Signed)
Patient has [place] Medicaid. Nebivolol is non-preferred. Guidelines state she must try a medication on the preferred list first.  The following were listed as preferred options: atenolol, carvedilol, labetalol, metoprolol, propranolol, sotalol.

## 2021-10-03 ENCOUNTER — Emergency Department (HOSPITAL_BASED_OUTPATIENT_CLINIC_OR_DEPARTMENT_OTHER)
Admission: EM | Admit: 2021-10-03 | Discharge: 2021-10-04 | Disposition: A | Discharge status: Home / Self Care | Payer: Medicaid Other | Attending: Emergency Medicine | Admitting: Emergency Medicine

## 2021-10-03 ENCOUNTER — Emergency Department (HOSPITAL_BASED_OUTPATIENT_CLINIC_OR_DEPARTMENT_OTHER): Payer: Medicaid Other

## 2021-10-03 ENCOUNTER — Encounter (HOSPITAL_BASED_OUTPATIENT_CLINIC_OR_DEPARTMENT_OTHER): Payer: Self-pay | Admitting: Emergency Medicine

## 2021-10-03 ENCOUNTER — Other Ambulatory Visit: Payer: Self-pay

## 2021-10-03 DIAGNOSIS — J45909 Unspecified asthma, uncomplicated: Secondary | ICD-10-CM | POA: Diagnosis not present

## 2021-10-03 DIAGNOSIS — Z7951 Long term (current) use of inhaled steroids: Secondary | ICD-10-CM | POA: Diagnosis not present

## 2021-10-03 DIAGNOSIS — I509 Heart failure, unspecified: Secondary | ICD-10-CM | POA: Insufficient documentation

## 2021-10-03 DIAGNOSIS — I110 Hypertensive heart disease with heart failure: Secondary | ICD-10-CM | POA: Insufficient documentation

## 2021-10-03 DIAGNOSIS — J9801 Acute bronchospasm: Secondary | ICD-10-CM | POA: Insufficient documentation

## 2021-10-03 DIAGNOSIS — R0789 Other chest pain: Secondary | ICD-10-CM | POA: Diagnosis present

## 2021-10-03 LAB — BASIC METABOLIC PANEL
Anion gap: 6 (ref 5–15)
BUN: 12 mg/dL (ref 6–20)
CO2: 24 mmol/L (ref 22–32)
Calcium: 9.2 mg/dL (ref 8.9–10.3)
Chloride: 107 mmol/L (ref 98–111)
Creatinine, Ser: 1.11 mg/dL — ABNORMAL HIGH (ref 0.44–1.00)
GFR, Estimated: 60 mL/min — ABNORMAL LOW (ref >60)
Glucose, Bld: 86 mg/dL (ref 70–99)
Potassium: 3.8 mmol/L (ref 3.5–5.1)
Sodium: 137 mmol/L (ref 135–145)

## 2021-10-03 LAB — CBC
HCT: 39.1 % (ref 36.0–46.0)
Hemoglobin: 13.5 g/dL (ref 12.0–15.0)
MCH: 32.7 pg (ref 26.0–34.0)
MCHC: 34.5 g/dL (ref 30.0–36.0)
MCV: 94.7 fL (ref 80.0–100.0)
Platelets: 346 10*3/uL (ref 150–400)
RBC: 4.13 MIL/uL (ref 3.87–5.11)
RDW: 14.1 % (ref 11.5–15.5)
WBC: 6.5 10*3/uL (ref 4.0–10.5)
nRBC: 0 % (ref 0.0–0.2)

## 2021-10-03 NOTE — ED Triage Notes (Signed)
Reports central chest pain that radiates into the left chest that started suddenly tonight while grilling out.  Does endorse SOB.

## 2021-10-04 LAB — D-DIMER, QUANTITATIVE: D-Dimer, Quant: 0.31 ug/mL-FEU (ref 0.00–0.50)

## 2021-10-04 LAB — TROPONIN I (HIGH SENSITIVITY)
Troponin I (High Sensitivity): 8 ng/L (ref <18)
Troponin I (High Sensitivity): 9 ng/L (ref <18)

## 2021-10-04 MED ORDER — FENTANYL CITRATE PF 50 MCG/ML IJ SOSY: 100.0000 ug | PREFILLED_SYRINGE | Freq: Once | INTRAMUSCULAR | Status: DC

## 2021-10-04 MED ORDER — KETOROLAC TROMETHAMINE 15 MG/ML IJ SOLN
15.0000 mg | Freq: Once | INTRAMUSCULAR | Status: AC
  Administered 2021-10-04: 15 mg via INTRAVENOUS
  Filled 2021-10-04: qty 1

## 2021-10-04 MED ORDER — FENTANYL CITRATE PF 50 MCG/ML IJ SOSY: 50.0000 ug | PREFILLED_SYRINGE | Freq: Once | INTRAMUSCULAR | Status: DC

## 2021-10-04 MED ORDER — HYDROMORPHONE HCL 1 MG/ML IJ SOLN
1.0000 mg | Freq: Once | INTRAMUSCULAR | Status: AC
  Administered 2021-10-04: 1 mg via INTRAVENOUS
  Filled 2021-10-04: qty 1

## 2021-10-04 NOTE — ED Notes (Signed)
Monitored pt 20 min post-Dilaudid, and pt now has ride waiting outside for transport home. VSS

## 2021-10-04 NOTE — ED Notes (Signed)
Pt reports no pain relief after Toradol
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
## 2021-10-25 ENCOUNTER — Encounter (HOSPITAL_BASED_OUTPATIENT_CLINIC_OR_DEPARTMENT_OTHER): Payer: Self-pay | Admitting: Emergency Medicine

## 2021-10-25 ENCOUNTER — Other Ambulatory Visit: Payer: Self-pay

## 2021-10-25 ENCOUNTER — Emergency Department (HOSPITAL_BASED_OUTPATIENT_CLINIC_OR_DEPARTMENT_OTHER): Payer: Medicaid Other

## 2021-10-25 ENCOUNTER — Inpatient Hospital Stay (HOSPITAL_BASED_OUTPATIENT_CLINIC_OR_DEPARTMENT_OTHER)
Admission: EM | Admit: 2021-10-25 | Discharge: 2021-10-29 | DRG: 871 | Disposition: A | Discharge status: Home / Self Care | Payer: Medicaid Other | Attending: Student | Admitting: Student

## 2021-10-25 DIAGNOSIS — A419 Sepsis, unspecified organism: Principal | ICD-10-CM | POA: Diagnosis present

## 2021-10-25 DIAGNOSIS — R42 Dizziness and giddiness: Secondary | ICD-10-CM | POA: Diagnosis present

## 2021-10-25 DIAGNOSIS — Z20822 Contact with and (suspected) exposure to covid-19: Secondary | ICD-10-CM | POA: Diagnosis not present

## 2021-10-25 DIAGNOSIS — E876 Hypokalemia: Secondary | ICD-10-CM | POA: Diagnosis present

## 2021-10-25 DIAGNOSIS — E785 Hyperlipidemia, unspecified: Secondary | ICD-10-CM | POA: Diagnosis not present

## 2021-10-25 DIAGNOSIS — J45901 Unspecified asthma with (acute) exacerbation: Secondary | ICD-10-CM | POA: Diagnosis present

## 2021-10-25 DIAGNOSIS — Z8673 Personal history of transient ischemic attack (TIA), and cerebral infarction without residual deficits: Secondary | ICD-10-CM

## 2021-10-25 DIAGNOSIS — Z818 Family history of other mental and behavioral disorders: Secondary | ICD-10-CM | POA: Diagnosis not present

## 2021-10-25 DIAGNOSIS — J4541 Moderate persistent asthma with (acute) exacerbation: Secondary | ICD-10-CM | POA: Diagnosis not present

## 2021-10-25 DIAGNOSIS — E669 Obesity, unspecified: Secondary | ICD-10-CM | POA: Diagnosis not present

## 2021-10-25 DIAGNOSIS — F411 Generalized anxiety disorder: Secondary | ICD-10-CM | POA: Diagnosis not present

## 2021-10-25 DIAGNOSIS — Z79899 Other long term (current) drug therapy: Secondary | ICD-10-CM

## 2021-10-25 DIAGNOSIS — N179 Acute kidney failure, unspecified: Secondary | ICD-10-CM | POA: Diagnosis present

## 2021-10-25 DIAGNOSIS — J9601 Acute respiratory failure with hypoxia: Secondary | ICD-10-CM | POA: Diagnosis not present

## 2021-10-25 DIAGNOSIS — Z79891 Long term (current) use of opiate analgesic: Secondary | ICD-10-CM | POA: Diagnosis not present

## 2021-10-25 DIAGNOSIS — Z6834 Body mass index (BMI) 34.0-34.9, adult: Secondary | ICD-10-CM | POA: Diagnosis not present

## 2021-10-25 DIAGNOSIS — I10 Essential (primary) hypertension: Secondary | ICD-10-CM | POA: Diagnosis present

## 2021-10-25 DIAGNOSIS — J189 Pneumonia, unspecified organism: Secondary | ICD-10-CM | POA: Diagnosis not present

## 2021-10-25 DIAGNOSIS — Z888 Allergy status to other drugs, medicaments and biological substances status: Secondary | ICD-10-CM

## 2021-10-25 DIAGNOSIS — R6520 Severe sepsis without septic shock: Secondary | ICD-10-CM | POA: Diagnosis present

## 2021-10-25 DIAGNOSIS — D72829 Elevated white blood cell count, unspecified: Secondary | ICD-10-CM

## 2021-10-25 DIAGNOSIS — Z7951 Long term (current) use of inhaled steroids: Secondary | ICD-10-CM

## 2021-10-25 LAB — CBC WITH DIFFERENTIAL/PLATELET
Abs Immature Granulocytes: 0.09 10*3/uL — ABNORMAL HIGH (ref 0.00–0.07)
Basophils Absolute: 0 10*3/uL (ref 0.0–0.1)
Basophils Relative: 0 %
Eosinophils Absolute: 0.1 10*3/uL (ref 0.0–0.5)
Eosinophils Relative: 0 %
HCT: 37.1 % (ref 36.0–46.0)
Hemoglobin: 12.9 g/dL (ref 12.0–15.0)
Immature Granulocytes: 1 %
Lymphocytes Relative: 12 %
Lymphs Abs: 2.1 10*3/uL (ref 0.7–4.0)
MCH: 32.3 pg (ref 26.0–34.0)
MCHC: 34.8 g/dL (ref 30.0–36.0)
MCV: 93 fL (ref 80.0–100.0)
Monocytes Absolute: 0.5 10*3/uL (ref 0.1–1.0)
Monocytes Relative: 3 %
Neutro Abs: 14.2 10*3/uL — ABNORMAL HIGH (ref 1.7–7.7)
Neutrophils Relative %: 84 %
Platelets: 256 10*3/uL (ref 150–400)
RBC: 3.99 MIL/uL (ref 3.87–5.11)
RDW: 14.1 % (ref 11.5–15.5)
WBC: 17 10*3/uL — ABNORMAL HIGH (ref 4.0–10.5)
nRBC: 0 % (ref 0.0–0.2)

## 2021-10-25 LAB — BASIC METABOLIC PANEL
Anion gap: 9 (ref 5–15)
BUN: 11 mg/dL (ref 6–20)
CO2: 20 mmol/L — ABNORMAL LOW (ref 22–32)
Calcium: 8.6 mg/dL — ABNORMAL LOW (ref 8.9–10.3)
Chloride: 106 mmol/L (ref 98–111)
Creatinine, Ser: 1.05 mg/dL — ABNORMAL HIGH (ref 0.44–1.00)
GFR, Estimated: >60 mL/min (ref >60)
Glucose, Bld: 149 mg/dL — ABNORMAL HIGH (ref 70–99)
Potassium: 3.3 mmol/L — ABNORMAL LOW (ref 3.5–5.1)
Sodium: 135 mmol/L (ref 135–145)

## 2021-10-25 LAB — RESP PANEL BY RT-PCR (FLU A&B, COVID) ARPGX2
Influenza A by PCR: NEGATIVE
Influenza B by PCR: NEGATIVE
SARS Coronavirus 2 by RT PCR: NEGATIVE

## 2021-10-25 LAB — PROCALCITONIN: Procalcitonin: <0.1 ng/mL

## 2021-10-25 LAB — BRAIN NATRIURETIC PEPTIDE: B Natriuretic Peptide: 59.2 pg/mL (ref 0.0–100.0)

## 2021-10-25 LAB — TROPONIN I (HIGH SENSITIVITY)
Troponin I (High Sensitivity): 8 ng/L (ref <18)
Troponin I (High Sensitivity): 8 ng/L (ref <18)

## 2021-10-25 MED ORDER — SODIUM CHLORIDE 0.9 % IV SOLN
500.0000 mg | Freq: Once | INTRAVENOUS | Status: AC
  Administered 2021-10-25: 500 mg via INTRAVENOUS
  Filled 2021-10-25: qty 5

## 2021-10-25 MED ORDER — OXYCODONE-ACETAMINOPHEN 5-325 MG PO TABS
1.0000 | ORAL_TABLET | Freq: Once | ORAL | Status: AC
  Administered 2021-10-25: 1 via ORAL
  Filled 2021-10-25: qty 1

## 2021-10-25 MED ORDER — FENTANYL CITRATE PF 50 MCG/ML IJ SOSY
50.0000 ug | PREFILLED_SYRINGE | Freq: Once | INTRAMUSCULAR | Status: DC
  Filled 2021-10-25: qty 1

## 2021-10-25 MED ORDER — OXYCODONE-ACETAMINOPHEN 5-325 MG PO TABS
2.0000 | ORAL_TABLET | Freq: Four times a day (QID) | ORAL | Status: DC | PRN
  Administered 2021-10-25 – 2021-10-29 (×13): 2 via ORAL
  Filled 2021-10-25 (×17): qty 2

## 2021-10-25 MED ORDER — SODIUM CHLORIDE 0.9 % IV SOLN
1.0000 g | Freq: Once | INTRAVENOUS | Status: AC
  Administered 2021-10-25: 1 g via INTRAVENOUS
  Filled 2021-10-25: qty 10

## 2021-10-25 MED ORDER — SODIUM CHLORIDE 0.9 % IV SOLN: 1.0000 g | INTRAVENOUS | Status: DC

## 2021-10-25 MED ORDER — ACETAMINOPHEN 325 MG PO TABS: 650.0000 mg | ORAL_TABLET | Freq: Four times a day (QID) | ORAL | Status: DC | PRN

## 2021-10-25 MED ORDER — METHYLPREDNISOLONE SODIUM SUCC 125 MG IJ SOLR
125.0000 mg | Freq: Once | INTRAMUSCULAR | Status: AC
  Administered 2021-10-25: 125 mg via INTRAVENOUS
  Filled 2021-10-25: qty 2

## 2021-10-25 MED ORDER — IPRATROPIUM-ALBUTEROL 0.5-2.5 (3) MG/3ML IN SOLN
RESPIRATORY_TRACT | Status: AC
  Administered 2021-10-25: 3 mL via RESPIRATORY_TRACT
  Filled 2021-10-25: qty 3

## 2021-10-25 MED ORDER — ALBUTEROL (5 MG/ML) CONTINUOUS INHALATION SOLN: 10.0000 mg/h | INHALATION_SOLUTION | RESPIRATORY_TRACT | Status: DC

## 2021-10-25 MED ORDER — ACETAMINOPHEN 650 MG RE SUPP: 650.0000 mg | Freq: Four times a day (QID) | RECTAL | Status: DC | PRN

## 2021-10-25 MED ORDER — SODIUM CHLORIDE 0.9 % IV SOLN: INTRAVENOUS | Status: DC | PRN

## 2021-10-25 MED ORDER — IPRATROPIUM-ALBUTEROL 0.5-2.5 (3) MG/3ML IN SOLN: 3.0000 mL | Freq: Once | RESPIRATORY_TRACT | Status: AC

## 2021-10-25 MED ORDER — SODIUM CHLORIDE 0.9 % IV BOLUS
1000.0000 mL | Freq: Once | INTRAVENOUS | Status: AC
  Administered 2021-10-25: 1000 mL via INTRAVENOUS

## 2021-10-25 MED ORDER — IPRATROPIUM-ALBUTEROL 0.5-2.5 (3) MG/3ML IN SOLN
3.0000 mL | Freq: Four times a day (QID) | RESPIRATORY_TRACT | Status: DC
  Administered 2021-10-26 – 2021-10-27 (×7): 3 mL via RESPIRATORY_TRACT
  Filled 2021-10-25 (×7): qty 3

## 2021-10-25 MED ORDER — ALBUTEROL SULFATE (2.5 MG/3ML) 0.083% IN NEBU
INHALATION_SOLUTION | RESPIRATORY_TRACT | Status: AC
  Administered 2021-10-25: 2.5 mg via RESPIRATORY_TRACT
  Filled 2021-10-25: qty 3

## 2021-10-25 MED ORDER — SODIUM CHLORIDE 0.9 % IV SOLN
500.0000 mg | INTRAVENOUS | Status: DC
  Administered 2021-10-26 – 2021-10-28 (×3): 500 mg via INTRAVENOUS
  Filled 2021-10-25 (×4): qty 5

## 2021-10-25 MED ORDER — IPRATROPIUM-ALBUTEROL 0.5-2.5 (3) MG/3ML IN SOLN: 3.0000 mL | RESPIRATORY_TRACT | Status: DC | PRN

## 2021-10-25 MED ORDER — MAGNESIUM SULFATE 2 GM/50ML IV SOLN
2.0000 g | Freq: Once | INTRAVENOUS | Status: AC
  Administered 2021-10-25: 2 g via INTRAVENOUS
  Filled 2021-10-25: qty 50

## 2021-10-25 MED ORDER — ALBUTEROL SULFATE (2.5 MG/3ML) 0.083% IN NEBU: 2.5000 mg | INHALATION_SOLUTION | Freq: Once | RESPIRATORY_TRACT | Status: AC

## 2021-10-25 MED ORDER — POTASSIUM CHLORIDE CRYS ER 20 MEQ PO TBCR
40.0000 meq | EXTENDED_RELEASE_TABLET | Freq: Once | ORAL | Status: AC
  Administered 2021-10-25: 40 meq via ORAL
  Filled 2021-10-25: qty 2

## 2021-10-25 MED ORDER — IOHEXOL 350 MG/ML SOLN
100.0000 mL | Freq: Once | INTRAVENOUS | Status: AC | PRN
  Administered 2021-10-25: 100 mL via INTRAVENOUS

## 2021-10-25 MED ORDER — ALBUTEROL SULFATE (2.5 MG/3ML) 0.083% IN NEBU
10.0000 mg | INHALATION_SOLUTION | Freq: Once | RESPIRATORY_TRACT | Status: AC
  Administered 2021-10-25: 10 mg via RESPIRATORY_TRACT
  Filled 2021-10-25: qty 12

## 2021-10-25 MED ORDER — METHYLPREDNISOLONE SODIUM SUCC 125 MG IJ SOLR
80.0000 mg | Freq: Two times a day (BID) | INTRAMUSCULAR | Status: DC
  Administered 2021-10-26: 80 mg via INTRAVENOUS
  Filled 2021-10-25: qty 2

## 2021-10-25 NOTE — ED Triage Notes (Signed)
Patient c/o shortness of breath and chest pain since 4 am this morning. Patient states she has been using her nebulizer at home with no relief.

## 2021-10-25 NOTE — ED Provider Notes (Signed)
  Provider Note MRN:  [id number]  Arrival date & time: 10/25/21    ED Course and Medical Decision Making  Assumed care from Dysktra at shift change.  See note from prior team for complete details, in brief:   51 yo female Asthma hx Here with asthma exacerbation; dib since this am Wheezing on arrival CP a/w the dib Given nebs/steroids 1hr CAT Delta trop to re-assess  WBC 17, afebrile, K3.3, K+ replaced orally  Plan per prior physician re-assess  Patient persistent wheezing on re-assess, dyspnea.  She is hypoxic on RA at rest We will obtain CT PE, I reviewed images, no obvious large PE but there is patchy infiltrates to right middle lobe concerning for pneumonia Start rocephin/azithro Give MgSO4 Continue 2L [place] Plan admit for PNA/asthma exacerbation Spoke with Dr Hal Hope who accepts pt for admission RVP pending  .Critical Care  Performed by: Jeanell Sparrow, DO Authorized by: Jeanell Sparrow, DO   Critical care provider statement:    Critical care time (minutes):  30   Critical care time was exclusive of:  Separately billable procedures and treating other patients   Critical care was necessary to treat or prevent imminent or life-threatening deterioration of the following conditions:  Respiratory failure   Critical care was time spent personally by me on the following activities:  Development of treatment plan with patient or surrogate, discussions with consultants, evaluation of patient's response to treatment, examination of patient, ordering and review of laboratory studies, ordering and review of radiographic studies, ordering and performing treatments and interventions, pulse oximetry, re-evaluation of patient's condition, review of old charts and obtaining history from patient or surrogate   Care discussed with: admitting provider     Final Clinical Impressions(s) / ED Diagnoses     ICD-10-CM   1. Moderate persistent asthma with exacerbation  J45.41     2.  Leukocytosis, unspecified type  D72.829     3. Hypokalemia  E87.6     4. Acute respiratory failure with hypoxia (HCC)  J96.01     5. Community acquired pneumonia, unspecified laterality  J18.9       ED Discharge Orders     None       Discharge Instructions   None          Jeanell Sparrow, DO 10/25/21 1902

## 2021-10-25 NOTE — ED Notes (Signed)
Pt transported to CT ?

## 2021-10-25 NOTE — ED Provider Notes (Signed)
[facility] EMERGENCY DEPARTMENT Provider Note   CSN: [id number] Arrival date & time: 10/25/21  1322     History  Chief Complaint  Patient presents with   Shortness of Breath    Valerie Haney is a 51 y.o. female.  Presented to ER for shortness of breath.  She reports that yesterday she was in her normal state of health, doing well.  This morning when she woke up she felt very short of breath.  She has felt like her breathing has been relatively constant this morning, some chest tightness.  Chest pain worse with coughing.  She has had significant cough, nonproductive.  She denies any vomiting.  No fevers.  She does endorse prior history of asthma, has been taking albuterol at home with some improvement.  HPI     Home Medications Prior to Admission medications   Medication Sig Start Date End Date Taking? Authorizing Provider  ALPRAZolam Duanne Moron) 1 MG tablet Take 1 mg by mouth 2 (two) times daily as needed for anxiety. 11/04/20   [provider]  atorvastatin (LIPITOR) 40 MG tablet Take 40 mg by mouth daily. 05/31/16   [provider]  budesonide-formoterol (SYMBICORT) 160-4.5 MCG/ACT inhaler Inhale 2 puffs into the lungs 2 (two) times daily.    [provider]  nebivolol (BYSTOLIC) 5 MG tablet Take 1 tablet (5 mg total) by mouth daily. 08/13/21 08/08/22  Alric Ran, MD  OXcarbazepine ER 600 MG TB24 Take 600 mg by mouth daily. 08/13/21 09/12/21  Alric Ran, MD  oxyCODONE-acetaminophen (PERCOCET) 10-325 MG tablet Take 1 tablet by mouth in the morning, at noon, in the evening, and at bedtime.    [provider]      Allergies    Lisinopril    Review of Systems   Review of Systems  Constitutional:  Negative for chills and fever.  HENT:  Negative for ear pain and sore throat.   Eyes:  Negative for pain and visual disturbance.  Respiratory:  Positive for cough, chest tightness and shortness of breath.   Cardiovascular:  Positive for chest  pain. Negative for palpitations.  Gastrointestinal:  Negative for abdominal pain and vomiting.  Genitourinary:  Negative for dysuria and hematuria.  Musculoskeletal:  Negative for arthralgias and back pain.  Skin:  Negative for color change and rash.  Neurological:  Negative for seizures and syncope.  All other systems reviewed and are negative.   Physical Exam Updated Vital Signs BP (!) 145/103 (BP Location: Left Arm)   Pulse 91   Temp 97.9 F (36.6 C) (Oral)   Resp (!) 24   Ht '5\' 5"'$  (1.651 m)   Wt 90 kg   SpO2 96%   BMI 33.02 kg/m  Physical Exam Vitals and nursing note reviewed.  Constitutional:      General: She is not in acute distress.    Appearance: She is well-developed.  HENT:     Head: Normocephalic and atraumatic.  Eyes:     Conjunctiva/sclera: Conjunctivae normal.  Cardiovascular:     Rate and Rhythm: Normal rate and regular rhythm.     Heart sounds: No murmur heard. Pulmonary:     Effort: Pulmonary effort is normal.     Comments: Bilateral expiratory wheezing noted Abdominal:     Palpations: Abdomen is soft.     Tenderness: There is no abdominal tenderness.  Musculoskeletal:        General: No swelling.     Cervical back: Neck supple.  Skin:  General: Skin is warm and dry.     Capillary Refill: Capillary refill takes less than 2 seconds.  Neurological:     Mental Status: She is alert.  Psychiatric:        Mood and Affect: Mood normal.     ED Results / Procedures / Treatments   Labs (all labs ordered are listed, but only abnormal results are displayed) Labs Reviewed  CBC WITH DIFFERENTIAL/PLATELET - Abnormal; Notable for the following components:      Result Value   WBC 17.0 (*)    Neutro Abs 14.2 (*)    Abs Immature Granulocytes 0.09 (*)    All other components within normal limits  BASIC METABOLIC PANEL - Abnormal; Notable for the following components:   Potassium 3.3 (*)    CO2 20 (*)    Glucose, Bld 149 (*)    Creatinine, Ser 1.05 (*)     Calcium 8.6 (*)    All other components within normal limits  BRAIN NATRIURETIC PEPTIDE  TROPONIN I (HIGH SENSITIVITY)    EKG None  Radiology DG Chest Portable 1 View  Result Date: 10/25/2021 CLINICAL DATA:  Shortness of breath EXAM: PORTABLE CHEST 1 VIEW COMPARISON:  10/03/2021 FINDINGS: The heart size and mediastinal contours are within normal limits. Both lungs are clear. The visualized skeletal structures are unremarkable. IMPRESSION: No active disease. Electronically Signed   By: Davina Poke D.O.   On: 10/25/2021 14:36    Procedures Procedures    Medications Ordered in ED Medications  albuterol (PROVENTIL) (2.5 MG/3ML) 0.083% nebulizer solution 2.5 mg (2.5 mg Nebulization Given 10/25/21 1345)  ipratropium-albuterol (DUONEB) 0.5-2.5 (3) MG/3ML nebulizer solution 3 mL (3 mLs Nebulization Given 10/25/21 1344)  methylPREDNISolone sodium succinate (SOLU-MEDROL) 125 mg/2 mL injection 125 mg (125 mg Intravenous Given 10/25/21 1423)  albuterol (PROVENTIL) (2.5 MG/3ML) 0.083% nebulizer solution 10 mg (10 mg Nebulization Given 10/25/21 1445)  potassium chloride SA (KLOR-CON M) CR tablet 40 mEq (40 mEq Oral Given 10/25/21 1446)  oxyCODONE-acetaminophen (PERCOCET/ROXICET) 5-325 MG per tablet 1 tablet (1 tablet Oral Given 10/25/21 1446)    ED Course/ Medical Decision Making/ A&P                           Medical Decision Making Amount and/or Complexity of Data Reviewed Labs: ordered. Radiology: ordered.  Risk Prescription drug management.   51 year old lady presenting to the emergency room due to concern for shortness of breath.  On physical examination I appreciated significant expiratory wheezing but patient is not in respiratory distress.  No frank hypoxia on room air.  CXR without clear infiltrate per my review and per radiology report.  Suspect most likely reactive airway, asthma exacerbation.  Checked EKG, troponin, BNP to evaluate for other etiologies.  EKG without acute  ischemic change, troponin within normal limits, doubt ACS.  BMP with slight hypokalemia.  Provided some oral supplementation.  Some leukocytosis noted.  No fever, no infiltrate on CXR, have overall lower suspicion for pneumonia.  While awaiting reassessment after continuous albuterol treatment, signed out to oncoming provider.  Plan and disposition pending reassessment, repeat troponin, BNP.        Final Clinical Impression(s) / ED Diagnoses Final diagnoses:  Exacerbation of asthma, unspecified asthma severity, unspecified whether persistent  Leukocytosis, unspecified type  Hypokalemia    Rx / DC Orders ED Discharge Orders     None         Lucrezia Starch, MD 10/25/21 1447

## 2021-10-25 NOTE — ED Notes (Signed)
Report given to Rich with [facility], in route to facility

## 2021-10-25 NOTE — ED Notes (Signed)
Patient is in ct

## 2021-10-25 NOTE — ED Notes (Signed)
Patient is breathing unlabored. Alert x4

## 2021-10-26 ENCOUNTER — Encounter (HOSPITAL_COMMUNITY): Payer: Self-pay | Admitting: Internal Medicine

## 2021-10-26 DIAGNOSIS — E876 Hypokalemia: Secondary | ICD-10-CM | POA: Diagnosis present

## 2021-10-26 DIAGNOSIS — F411 Generalized anxiety disorder: Secondary | ICD-10-CM | POA: Diagnosis present

## 2021-10-26 DIAGNOSIS — Z888 Allergy status to other drugs, medicaments and biological substances status: Secondary | ICD-10-CM | POA: Diagnosis not present

## 2021-10-26 DIAGNOSIS — J189 Pneumonia, unspecified organism: Secondary | ICD-10-CM | POA: Diagnosis present

## 2021-10-26 DIAGNOSIS — E669 Obesity, unspecified: Secondary | ICD-10-CM | POA: Diagnosis present

## 2021-10-26 DIAGNOSIS — J45901 Unspecified asthma with (acute) exacerbation: Secondary | ICD-10-CM | POA: Diagnosis present

## 2021-10-26 DIAGNOSIS — Z20822 Contact with and (suspected) exposure to covid-19: Secondary | ICD-10-CM | POA: Diagnosis present

## 2021-10-26 DIAGNOSIS — Z79899 Other long term (current) drug therapy: Secondary | ICD-10-CM | POA: Diagnosis not present

## 2021-10-26 DIAGNOSIS — E785 Hyperlipidemia, unspecified: Secondary | ICD-10-CM | POA: Diagnosis present

## 2021-10-26 DIAGNOSIS — Z818 Family history of other mental and behavioral disorders: Secondary | ICD-10-CM | POA: Diagnosis not present

## 2021-10-26 DIAGNOSIS — A419 Sepsis, unspecified organism: Secondary | ICD-10-CM | POA: Diagnosis present

## 2021-10-26 DIAGNOSIS — J4541 Moderate persistent asthma with (acute) exacerbation: Secondary | ICD-10-CM | POA: Diagnosis present

## 2021-10-26 DIAGNOSIS — Z8673 Personal history of transient ischemic attack (TIA), and cerebral infarction without residual deficits: Secondary | ICD-10-CM | POA: Diagnosis not present

## 2021-10-26 DIAGNOSIS — Z6834 Body mass index (BMI) 34.0-34.9, adult: Secondary | ICD-10-CM | POA: Diagnosis not present

## 2021-10-26 DIAGNOSIS — J9601 Acute respiratory failure with hypoxia: Secondary | ICD-10-CM | POA: Diagnosis present

## 2021-10-26 DIAGNOSIS — N179 Acute kidney failure, unspecified: Secondary | ICD-10-CM | POA: Diagnosis present

## 2021-10-26 DIAGNOSIS — R6520 Severe sepsis without septic shock: Secondary | ICD-10-CM | POA: Diagnosis present

## 2021-10-26 DIAGNOSIS — I10 Essential (primary) hypertension: Secondary | ICD-10-CM | POA: Diagnosis present

## 2021-10-26 DIAGNOSIS — R42 Dizziness and giddiness: Secondary | ICD-10-CM | POA: Diagnosis present

## 2021-10-26 DIAGNOSIS — Z79891 Long term (current) use of opiate analgesic: Secondary | ICD-10-CM | POA: Diagnosis not present

## 2021-10-26 DIAGNOSIS — Z7951 Long term (current) use of inhaled steroids: Secondary | ICD-10-CM | POA: Diagnosis not present

## 2021-10-26 LAB — COMPREHENSIVE METABOLIC PANEL
ALT: 19 U/L (ref 0–44)
AST: 35 U/L (ref 15–41)
Albumin: 3.9 g/dL (ref 3.5–5.0)
Alkaline Phosphatase: 80 U/L (ref 38–126)
Anion gap: 8 (ref 5–15)
BUN: 10 mg/dL (ref 6–20)
CO2: 19 mmol/L — ABNORMAL LOW (ref 22–32)
Calcium: 9 mg/dL (ref 8.9–10.3)
Chloride: 107 mmol/L (ref 98–111)
Creatinine, Ser: 0.69 mg/dL (ref 0.44–1.00)
GFR, Estimated: >60 mL/min (ref >60)
Glucose, Bld: 115 mg/dL — ABNORMAL HIGH (ref 70–99)
Potassium: 4.5 mmol/L (ref 3.5–5.1)
Sodium: 134 mmol/L — ABNORMAL LOW (ref 135–145)
Total Bilirubin: 0.8 mg/dL (ref 0.3–1.2)
Total Protein: 6.9 g/dL (ref 6.5–8.1)

## 2021-10-26 LAB — URINALYSIS, COMPLETE (UACMP) WITH MICROSCOPIC
Bacteria, UA: NONE SEEN
Bilirubin Urine: NEGATIVE
Glucose, UA: NEGATIVE mg/dL
Hgb urine dipstick: NEGATIVE
Ketones, ur: NEGATIVE mg/dL
Leukocytes,Ua: NEGATIVE
Nitrite: NEGATIVE
Protein, ur: NEGATIVE mg/dL
Specific Gravity, Urine: 1.01 (ref 1.005–1.030)
pH: 6 (ref 5.0–8.0)

## 2021-10-26 LAB — CBC WITH DIFFERENTIAL/PLATELET
Abs Immature Granulocytes: 0.16 10*3/uL — ABNORMAL HIGH (ref 0.00–0.07)
Basophils Absolute: 0 10*3/uL (ref 0.0–0.1)
Basophils Relative: 0 %
Eosinophils Absolute: 0 10*3/uL (ref 0.0–0.5)
Eosinophils Relative: 0 %
HCT: 37.5 % (ref 36.0–46.0)
Hemoglobin: 12.9 g/dL (ref 12.0–15.0)
Immature Granulocytes: 1 %
Lymphocytes Relative: 8 %
Lymphs Abs: 1.2 10*3/uL (ref 0.7–4.0)
MCH: 32.1 pg (ref 26.0–34.0)
MCHC: 34.4 g/dL (ref 30.0–36.0)
MCV: 93.3 fL (ref 80.0–100.0)
Monocytes Absolute: 0.4 10*3/uL (ref 0.1–1.0)
Monocytes Relative: 3 %
Neutro Abs: 13.3 10*3/uL — ABNORMAL HIGH (ref 1.7–7.7)
Neutrophils Relative %: 88 %
Platelets: 223 10*3/uL (ref 150–400)
RBC: 4.02 MIL/uL (ref 3.87–5.11)
RDW: 14.1 % (ref 11.5–15.5)
WBC: 15.1 10*3/uL — ABNORMAL HIGH (ref 4.0–10.5)
nRBC: 0 % (ref 0.0–0.2)

## 2021-10-26 LAB — BLOOD GAS, VENOUS
Acid-base deficit: 3.5 mmol/L — ABNORMAL HIGH (ref 0.0–2.0)
Bicarbonate: 22.1 mmol/L (ref 20.0–28.0)
O2 Saturation: 82.5 %
Patient temperature: 36.4
pCO2, Ven: 40 mmHg — ABNORMAL LOW (ref 44–60)
pH, Ven: 7.35 (ref 7.25–7.43)
pO2, Ven: 50 mmHg — ABNORMAL HIGH (ref 32–45)

## 2021-10-26 LAB — LACTIC ACID, PLASMA: Lactic Acid, Venous: 1.2 mmol/L (ref 0.5–1.9)

## 2021-10-26 LAB — MAGNESIUM: Magnesium: 2.2 mg/dL (ref 1.7–2.4)

## 2021-10-26 LAB — PHOSPHORUS: Phosphorus: 2.5 mg/dL (ref 2.5–4.6)

## 2021-10-26 MED ORDER — HYDROCOD POLI-CHLORPHE POLI ER 10-8 MG/5ML PO SUER
5.0000 mL | Freq: Once | ORAL | Status: AC
  Administered 2021-10-26: 5 mL via ORAL
  Filled 2021-10-26: qty 5

## 2021-10-26 MED ORDER — HYDRALAZINE HCL 20 MG/ML IJ SOLN: 10.0000 mg | INTRAMUSCULAR | Status: DC | PRN

## 2021-10-26 MED ORDER — NEBIVOLOL HCL 5 MG PO TABS
5.0000 mg | ORAL_TABLET | Freq: Every day | ORAL | Status: DC
  Administered 2021-10-26 – 2021-10-29 (×4): 5 mg via ORAL
  Filled 2021-10-26 (×4): qty 1

## 2021-10-26 MED ORDER — METHYLPREDNISOLONE SODIUM SUCC 40 MG IJ SOLR
40.0000 mg | Freq: Two times a day (BID) | INTRAMUSCULAR | Status: DC
  Administered 2021-10-26 – 2021-10-27 (×2): 40 mg via INTRAVENOUS
  Filled 2021-10-26 (×2): qty 1

## 2021-10-26 MED ORDER — HYDROCOD POLI-CHLORPHE POLI ER 10-8 MG/5ML PO SUER
5.0000 mL | Freq: Two times a day (BID) | ORAL | Status: DC | PRN
  Administered 2021-10-27 – 2021-10-28 (×3): 5 mL via ORAL
  Filled 2021-10-26 (×3): qty 5

## 2021-10-26 MED ORDER — SODIUM CHLORIDE 0.9 % IV SOLN
2.0000 g | INTRAVENOUS | Status: DC
  Administered 2021-10-26 – 2021-10-28 (×3): 2 g via INTRAVENOUS
  Filled 2021-10-26 (×3): qty 20

## 2021-10-26 MED ORDER — OXCARBAZEPINE 300 MG PO TABS
600.0000 mg | ORAL_TABLET | Freq: Every day | ORAL | Status: DC
  Administered 2021-10-26 – 2021-10-29 (×4): 600 mg via ORAL
  Filled 2021-10-26 (×4): qty 2

## 2021-10-26 MED ORDER — ALPRAZOLAM 1 MG PO TABS
1.0000 mg | ORAL_TABLET | Freq: Two times a day (BID) | ORAL | Status: DC | PRN
  Administered 2021-10-26 – 2021-10-28 (×4): 1 mg via ORAL
  Filled 2021-10-26 (×4): qty 1

## 2021-10-26 MED ORDER — ATORVASTATIN CALCIUM 40 MG PO TABS
40.0000 mg | ORAL_TABLET | Freq: Every day | ORAL | Status: DC
  Administered 2021-10-26: 40 mg via ORAL
  Filled 2021-10-26: qty 1

## 2021-10-26 NOTE — Progress Notes (Signed)
PROGRESS NOTE  Valerie Haney  PQZ:[id number] DOB: February 13, 1971 DOA: 10/25/2021 PCP: Penni Bombard, PA   Brief Narrative:  Patient is a 51 year old female with history of hypertension, moderate persistent asthma, hyperlipidemia who presented with complaints of progressive shortness of breath, wheezing, cough, pleuritic right-sided chest pain, edema of lower extremities, fever.  On presentation, she was saturating fine on room air.  Lab work showed elevated leukocytes.  COVID/flu negative.  CTA chest did not show any evidence of PE but showed patchy airspace opacity in the right middle lobe consistent with pneumonia.  Patient was admitted for the management of acute exacerbation with concurrent community-acquired pneumonia.  Started on antibiotics, steroids.  Assessment & Plan:  Principal Problem:   CAP (community acquired pneumonia) Active Problems:   Asthma, chronic, unspecified asthma severity, with acute exacerbation   Essential hypertension   Sepsis ([place])   GAD (generalized anxiety disorder)   HLD (hyperlipidemia)  Sepsis secondary to community-acquired pneumonia: Presented with 2 to 3 days of progressive shortness of breath, cough, right-sided pleuritic chest pain associated with fever.  CTA chest showed airspace opacity in the right middle lobe.  Patient with leukocytosis, tachycardia, tachypnea. Continue current antibiotics, follow-up blood cultures. COVID/flu negative. Has mild leukocytosis which is improving.  Remains afebrile Continue cough medications, flutter valve.  Acute asthma exacerbation/acute hypoxic respiratory failure: History of moderate persistent asthma.  She says she gets attacks 2-3 times a week.  Presented with wheezing, breathing found to be labored on presentation.  Currently requiring 1 to 2 L of oxygen per minute.  Not on oxygen at home.  Continue bronchodilators, steroids.  We will try to wean the oxygen.  Hypokalemia: Supplemented  with  potassium  Hypertension: Takes Bystolic at home.  Continue.  Monitor blood pressure  Generalized  anxiety disorder: Takes Xanax as an outpatient.  Hyperlipidemia: Take lipitor  Obesity: BMI of 33.4        DVT prophylaxis:SCDs Start: 10/25/21 2110     Code Status: Full Code  Family Communication: None at bedside  Patient status:Inpatient  Patient is from :Home  Anticipated discharge FH:[facility]  Estimated DC date:1-2 days   Consultants: None  Procedures:None  Antimicrobials:  Anti-infectives (From admission, onward)    Start     Dose/Rate Route Frequency Ordered Stop   10/26/21 1800  azithromycin (ZITHROMAX) 500 mg in sodium chloride 0.9 % 250 mL IVPB        500 mg 250 mL/hr over 60 Minutes Intravenous Every 24 hours 10/25/21 2111     10/26/21 1700  cefTRIAXone (ROCEPHIN) 1 g in sodium chloride 0.9 % 100 mL IVPB        1 g 200 mL/hr over 30 Minutes Intravenous Every 24 hours 10/25/21 2111     10/25/21 1700  cefTRIAXone (ROCEPHIN) 1 g in sodium chloride 0.9 % 100 mL IVPB        1 g 200 mL/hr over 30 Minutes Intravenous  Once 10/25/21 1655 10/25/21 1755   10/25/21 1700  azithromycin (ZITHROMAX) 500 mg in sodium chloride 0.9 % 250 mL IVPB        500 mg 250 mL/hr over 60 Minutes Intravenous  Once 10/25/21 1655 10/25/21 1905       Subjective: Patient seen and examined the bedside this morning.  Lying in bed.  She says she is trying to rest.  Appears weak but is feeling slightly better.  Complains of pain on the right chest when she coughs.  Currently on 2 L of oxygen per minute  Objective: Vitals:   10/26/21 0206 10/26/21 0433 10/26/21 0436 10/26/21 0727  BP:  (!) 144/86    Pulse:  71    Resp:  16    Temp:  97.6 F (36.4 C)    TempSrc:  Oral    SpO2: 100% 97%  98%  Weight:   91.2 kg   Height:        Intake/Output Summary (Last 24 hours) at 10/26/2021 0807 Last data filed at 10/26/2021 0640 Gross per 24 hour  Intake 373.12 ml  Output 800 ml  Net -426.88  ml   Filed Weights   10/25/21 1330 10/25/21 2100 10/26/21 0436  Weight: 90 kg 89.4 kg 91.2 kg    Examination:  General exam: Overall comfortable, not in distress, obese HEENT: PERRL Respiratory system:  no wheezes or crackles, diminished air sounds bilaterally more on the right Cardiovascular system: S1 & S2 heard, RRR.  Gastrointestinal system: Abdomen is nondistended, soft and nontender. Central nervous system: Alert and oriented Extremities: No edema, no clubbing ,no cyanosis Skin: No rashes, no ulcers,no icterus     Data Reviewed: I have personally reviewed following labs and imaging studies  CBC: Recent Labs  Lab 10/25/21 1405 10/26/21 0442  WBC 17.0* 15.1*  NEUTROABS 14.2* 13.3*  HGB 12.9 12.9  HCT 37.1 37.5  MCV 93.0 93.3  PLT 256 017   Basic Metabolic Panel: Recent Labs  Lab 10/25/21 1405 10/26/21 0442  NA 135 134*  K 3.3* 4.5  CL 106 107  CO2 20* 19*  GLUCOSE 149* 115*  BUN 11 10  CREATININE 1.05* 0.69  CALCIUM 8.6* 9.0  MG  --  2.2  PHOS  --  2.5     Recent Results (from the past 240 hour(s))  Resp Panel by RT-PCR (Flu A&B, Covid) Anterior Nasal Swab     Status: None   Collection Time: 10/25/21  6:50 PM   Specimen: Anterior Nasal Swab  Result Value Ref Range Status   SARS Coronavirus 2 by RT PCR NEGATIVE NEGATIVE Final    Comment: (NOTE) SARS-CoV-2 target nucleic acids are NOT DETECTED.  The SARS-CoV-2 RNA is generally detectable in upper respiratory specimens during the acute phase of infection. The lowest concentration of SARS-CoV-2 viral copies this assay can detect is 138 copies/mL. A negative result does not preclude SARS-Cov-2 infection and should not be used as the sole basis for treatment or other patient management decisions. A negative result may occur with  improper specimen collection/handling, submission of specimen other than nasopharyngeal swab, presence of viral mutation(s) within the areas targeted by this assay, and  inadequate number of viral copies(<138 copies/mL). A negative result must be combined with clinical observations, patient history, and epidemiological information. The expected result is Negative.  Fact Sheet for Patients:  EntrepreneurPulse.com.au  Fact Sheet for Healthcare Providers:  IncredibleEmployment.be  This test is no t yet approved or cleared by the [place] FDA and  has been authorized for detection and/or diagnosis of SARS-CoV-2 by FDA under an Emergency Use Authorization (EUA). This EUA will remain  in effect (meaning this test can be used) for the duration of the COVID-19 declaration under Section 564(b)(1) of the Act, 21 U.S.C.section 360bbb-3(b)(1), unless the authorization is terminated  or revoked sooner.       Influenza A by PCR NEGATIVE NEGATIVE Final   Influenza B by PCR NEGATIVE NEGATIVE Final    Comment: (NOTE) The Xpert Xpress SARS-CoV-2/FLU/RSV plus assay is intended as an aid in the diagnosis of  influenza from Nasopharyngeal swab specimens and should not be used as a sole basis for treatment. Nasal washings and aspirates are unacceptable for Xpert Xpress SARS-CoV-2/FLU/RSV testing.  Fact Sheet for Patients: EntrepreneurPulse.com.au  Fact Sheet for Healthcare Providers: IncredibleEmployment.be  This test is not yet approved or cleared by the [place] FDA and has been authorized for detection and/or diagnosis of SARS-CoV-2 by FDA under an Emergency Use Authorization (EUA). This EUA will remain in effect (meaning this test can be used) for the duration of the COVID-19 declaration under Section 564(b)(1) of the Act, 21 U.S.C. section 360bbb-3(b)(1), unless the authorization is terminated or revoked.  Performed at [facility], [address], [place], [place] [address]      Radiology Studies: CT Angio Chest PE W and/or Wo Contrast  Result Date:  10/25/2021 CLINICAL DATA:  Central chest pain and shortness of breath. EXAM: CT ANGIOGRAPHY CHEST WITH CONTRAST TECHNIQUE: Multidetector CT imaging of the chest was performed using the standard protocol during bolus administration of intravenous contrast. Multiplanar CT image reconstructions and MIPs were obtained to evaluate the vascular anatomy. RADIATION DOSE REDUCTION: This exam was performed according to the departmental dose-optimization program which includes automated exposure control, adjustment of the mA and/or kV according to patient size and/or use of iterative reconstruction technique. CONTRAST:  180m OMNIPAQUE IOHEXOL 350 MG/ML SOLN COMPARISON:  CT angiogram chest 10/06/2020 FINDINGS: Cardiovascular: Satisfactory opacification of the pulmonary arteries to the segmental level. No evidence of pulmonary embolism. Normal heart size. No pericardial effusion. Mediastinum/Nodes: No enlarged mediastinal, hilar, or axillary lymph nodes. Thyroid gland, trachea, and esophagus demonstrate no significant findings. Lungs/Pleura: There is a small amount of patchy airspace disease in the right middle lobe. There are atelectatic changes in the bilateral lower lobes. There is a small thin walled cyst in the left lower lobe, nonspecific. No pleural effusion or pneumothorax. Upper Abdomen: No acute abnormality. Musculoskeletal: No chest wall abnormality. No acute or significant osseous findings. Review of the MIP images confirms the above findings. IMPRESSION: 1. No evidence for pulmonary embolism. 2. Patchy airspace disease in the right middle lobe compatible with infection. Follow-up CT recommended in 3 months to confirm resolution. Electronically Signed   By: ARonney AstersM.D.   On: 10/25/2021 16:39   DG Chest Portable 1 View  Result Date: 10/25/2021 CLINICAL DATA:  Shortness of breath EXAM: PORTABLE CHEST 1 VIEW COMPARISON:  10/03/2021 FINDINGS: The heart size and mediastinal contours are within normal limits.  Both lungs are clear. The visualized skeletal structures are unremarkable. IMPRESSION: No active disease. Electronically Signed   By: NDavina PokeD.O.   On: 10/25/2021 14:36    Scheduled Meds:  atorvastatin  40 mg Oral Daily   ipratropium-albuterol  3 mL Nebulization Q6H   methylPREDNISolone (SOLU-MEDROL) injection  80 mg Intravenous Q12H   nebivolol  5 mg Oral Daily   Continuous Infusions:  sodium chloride     azithromycin     cefTRIAXone (ROCEPHIN)  IV       LOS: 0 days   AShelly Coss MD [facility] P7/31/2023, 8:07 AM

## 2021-10-26 NOTE — Progress Notes (Signed)
Patient's BP elevated 170s-180s, Dr. Velia Meyer notified, scheduled BP med ordered and given, will continue to assess patient.

## 2021-10-26 NOTE — H&P (Signed)
History and Physical    PLEASE NOTE THAT DRAGON DICTATION SOFTWARE WAS USED IN THE CONSTRUCTION OF THIS NOTE.   Valerie Haney OIL:[id number] DOB: 12/08/1970 DOA: 10/25/2021  PCP: Penni Bombard, PA  Patient coming from: home   I have personally briefly reviewed patient's old medical records in [place]  Chief Complaint: Shortness of breath  HPI: Valerie Haney is a 51 y.o. female with medical history significant for essential hypertension, mild persistent asthma, hyperlipidemia, who is admitted to [facility] on 10/25/2021 by way of transfer from [place] emergency department with sepsis due to community-acquired pneumonia leading to acute asthma exacerbation after presenting from home to the latter facility complaining of shortness of breath.   The patient reports to 3 days of progressive shortness of breath associate with new onset wheezing, new onset nonproductive cough, pleuritic right-sided chest discomfort that is nonexertional.  Denies any associated orthopnea, PND, or new onset peripheral edema.  She also notes associated subjective fever in the absence of chills, full body rigors, or generalized myalgias.  No recent trauma or travel.  Denies any associated palpitations, diaphoresis, dizziness, presyncope, or syncope.  Not associate with any nausea, vomiting, diarrhea, abdominal pain, or rash.  There is a documented history of moderate persistent asthma, reports good compliance with her outpatient Symbicort.  Confirms that she is a lifelong non-smoker.     [facility] ED Course:  Vital signs in the ED were notable for the following: Afebrile; heart rate 01-56; systolic blood pressures in the 140s to 160s; respiratory rate 17-26, oxygen saturation 95 to 96% on room air.  Labs were notable for the following: BMP notable for the following: Potassium 3.3, creatinine 1.05.  High-sensitivity troponin I x3 values were notable for the following: Initial  value 9 followed by 8 followed by 8.  BNP 59.2.  D-dimer 0.31.  CBC notable for evidence of count 17,000 with 84% neutrophils, hemoglobin 12.9.  COVID-19/influenza PCR negative.  Imaging and additional notable ED work-up: EKG, comparison to most recent prior from 7023 shows sinus rhythm with heart rate 86, T wave inversion in leads II, III, aVF, and V3 through V6, all of which appear unchanged relative to most recent prior EKG, nonspecific less than 1 mm ST depression in leads II, V5, V6, of which ST depression and lead II appears unchanged, We will ST depression in V5/V6 appears slightly more pronounced this evening.  No evidence of ST elevation.  CTA chest showed no evidence of acute pulmonary embolism, showing patchy airspace opacity in the right middle lobe consistent with infection, will showing no evidence of edema, pleural effusion, or pneumothorax.  While in the ED, the following were administered: Albuterol nebulizer x1, duo nebulizer treatment x1, Solu-Medrol 125 mg IV x1, Percocet 5/325 mg p.o. x1, potassium chloride 40 mill colons p.o. x1, azithromycin, Rocephin, 2 g IV magnesium sulfate, normal saline x1 L bolus.  Subsequently, the patient was admitted to [facility] for further evaluation management of sepsis due to community-acquired pneumonia with resultant acute asthma exacerbation, with additional presenting labs notable for mild hypokalemia.    Review of Systems: As per HPI otherwise 10 point review of systems negative.   Past Medical History:  Diagnosis Date   Asthma    Blood transfusion    Cardiac arrhythmia    CHF (congestive heart failure) ([place])    Hypertension    Migraine    Seizure ([place])    Stroke ([place])  Past Surgical History:  Procedure Laterality Date   ABDOMINAL HYSTERECTOMY     ORTHOPEDIC SURGERY     TONSILLECTOMY      Social History:  reports that she has never smoked. She has never used smokeless tobacco. She reports that she does not drink alcohol  and does not use drugs.   Allergies  Allergen Reactions   Lisinopril Swelling    Family History  Problem Relation Age of Onset   Dementia Mother     Family history reviewed and not pertinent    Prior to Admission medications   Medication Sig Start Date End Date Taking? Authorizing Provider  ALPRAZolam Duanne Moron) 1 MG tablet Take 1 mg by mouth 2 (two) times daily as needed for anxiety. 11/04/20   [provider]  atorvastatin (LIPITOR) 40 MG tablet Take 40 mg by mouth daily. 05/31/16   [provider]  budesonide-formoterol (SYMBICORT) 160-4.5 MCG/ACT inhaler Inhale 2 puffs into the lungs 2 (two) times daily.    [provider]  nebivolol (BYSTOLIC) 5 MG tablet Take 1 tablet (5 mg total) by mouth daily. 08/13/21 08/08/22  Alric Ran, MD  OXcarbazepine ER 600 MG TB24 Take 600 mg by mouth daily. 08/13/21 09/12/21  Alric Ran, MD  oxyCODONE-acetaminophen (PERCOCET) 10-325 MG tablet Take 1 tablet by mouth in the morning, at noon, in the evening, and at bedtime.    [provider]     Objective    Physical Exam: Vitals:   10/26/21 0050 10/26/21 0206 10/26/21 0433 10/26/21 0436  BP: (!) 170/107  (!) 144/86   Pulse:   71   Resp:   16   Temp:   97.6 F (36.4 C)   TempSrc:   Oral   SpO2:  100% 97%   Weight:    91.2 kg  Height:        General: appears to be stated age; alert, oriented; mildly increased work of breathing noted Skin: warm, dry, no rash Head:  AT/[place] Mouth:  Oral mucosa membranes appear moist, normal dentition Neck: supple; trachea midline Heart:  RRR; did not appreciate any M/R/G Lungs: CTAB, did not appreciate any wheezes, rales, or rhonchi Abdomen: + BS; soft, ND, NT Vascular: 2+ pedal pulses b/l; 2+ radial pulses b/l Extremities: no peripheral edema, no muscle wasting Neuro: strength and sensation intact in upper and lower extremities b/l     Labs on Admission: I have personally reviewed following labs and imaging  studies  CBC: Recent Labs  Lab 10/25/21 1405 10/26/21 0442  WBC 17.0* 15.1*  NEUTROABS 14.2* 13.3*  HGB 12.9 12.9  HCT 37.1 37.5  MCV 93.0 93.3  PLT 256 291   Basic Metabolic Panel: Recent Labs  Lab 10/25/21 1405 10/26/21 0442  NA 135 134*  K 3.3* 4.5  CL 106 107  CO2 20* 19*  GLUCOSE 149* 115*  BUN 11 10  CREATININE 1.05* 0.69  CALCIUM 8.6* 9.0  MG  --  2.2  PHOS  --  2.5   GFR: Estimated Creatinine Clearance: 91.8 mL/min (by C-G formula based on SCr of 0.69 mg/dL). Liver Function Tests: Recent Labs  Lab 10/26/21 0442  AST 35  ALT 19  ALKPHOS 80  BILITOT 0.8  PROT 6.9  ALBUMIN 3.9   No results for input(s): "LIPASE", "AMYLASE" in the last 168 hours. No results for input(s): "AMMONIA" in the last 168 hours. Coagulation Profile: No results for input(s): "INR", "PROTIME" in the last 168 hours. Cardiac Enzymes: No results for input(s): "CKTOTAL", "  CKMB", "CKMBINDEX", "TROPONINI" in the last 168 hours. BNP (last 3 results) No results for input(s): "PROBNP" in the last 8760 hours. HbA1C: No results for input(s): "HGBA1C" in the last 72 hours. CBG: No results for input(s): "GLUCAP" in the last 168 hours. Lipid Profile: No results for input(s): "CHOL", "HDL", "LDLCALC", "TRIG", "CHOLHDL", "LDLDIRECT" in the last 72 hours. Thyroid Function Tests: No results for input(s): "TSH", "T4TOTAL", "FREET4", "T3FREE", "THYROIDAB" in the last 72 hours. Anemia Panel: No results for input(s): "VITAMINB12", "FOLATE", "FERRITIN", "TIBC", "IRON", "RETICCTPCT" in the last 72 hours. Urine analysis:    Component Value Date/Time   COLORURINE AMBER (A) 10/14/2016 2200   APPEARANCEUR CLOUDY (A) 10/14/2016 2200   LABSPEC 1.021 10/14/2016 2200   PHURINE 6.0 10/14/2016 2200   GLUCOSEU NEGATIVE 10/14/2016 2200   HGBUR NEGATIVE 10/14/2016 2200   BILIRUBINUR NEGATIVE 10/14/2016 2200   KETONESUR NEGATIVE 10/14/2016 2200   PROTEINUR 30 (A) 10/14/2016 2200   UROBILINOGEN 0.2  10/23/2013 1003   NITRITE POSITIVE (A) 10/14/2016 2200   LEUKOCYTESUR LARGE (A) 10/14/2016 2200    Radiological Exams on Admission: CT Angio Chest PE W and/or Wo Contrast  Result Date: 10/25/2021 CLINICAL DATA:  Central chest pain and shortness of breath. EXAM: CT ANGIOGRAPHY CHEST WITH CONTRAST TECHNIQUE: Multidetector CT imaging of the chest was performed using the standard protocol during bolus administration of intravenous contrast. Multiplanar CT image reconstructions and MIPs were obtained to evaluate the vascular anatomy. RADIATION DOSE REDUCTION: This exam was performed according to the departmental dose-optimization program which includes automated exposure control, adjustment of the mA and/or kV according to patient size and/or use of iterative reconstruction technique. CONTRAST:  147mL OMNIPAQUE IOHEXOL 350 MG/ML SOLN COMPARISON:  CT angiogram chest 10/06/2020 FINDINGS: Cardiovascular: Satisfactory opacification of the pulmonary arteries to the segmental level. No evidence of pulmonary embolism. Normal heart size. No pericardial effusion. Mediastinum/Nodes: No enlarged mediastinal, hilar, or axillary lymph nodes. Thyroid gland, trachea, and esophagus demonstrate no significant findings. Lungs/Pleura: There is a small amount of patchy airspace disease in the right middle lobe. There are atelectatic changes in the bilateral lower lobes. There is a small thin walled cyst in the left lower lobe, nonspecific. No pleural effusion or pneumothorax. Upper Abdomen: No acute abnormality. Musculoskeletal: No chest wall abnormality. No acute or significant osseous findings. Review of the MIP images confirms the above findings. IMPRESSION: 1. No evidence for pulmonary embolism. 2. Patchy airspace disease in the right middle lobe compatible with infection. Follow-up CT recommended in 3 months to confirm resolution. Electronically Signed   By: Ronney Asters M.D.   On: 10/25/2021 16:39   DG Chest Portable 1  View  Result Date: 10/25/2021 CLINICAL DATA:  Shortness of breath EXAM: PORTABLE CHEST 1 VIEW COMPARISON:  10/03/2021 FINDINGS: The heart size and mediastinal contours are within normal limits. Both lungs are clear. The visualized skeletal structures are unremarkable. IMPRESSION: No active disease. Electronically Signed   By: Davina Poke D.O.   On: 10/25/2021 14:36     EKG: Independently reviewed, with result as described above.    Assessment/Plan    Principal Problem:   CAP (community acquired pneumonia) Active Problems:   Asthma, chronic, unspecified asthma severity, with acute exacerbation   Essential hypertension   Sepsis ([place])   GAD (generalized anxiety disorder)   HLD (hyperlipidemia)      #) Sepsis due to community-acquired pneumonia: Diagnosis on the basis of 2 to 3 days of progressive shortness of breath associate with new onset cough, right-sided pleuritic  chest discomfort associated with subjective fever, with CTA chest showing airspace opacity in the right middle lobe consistent with infection and also corresponding to the patient's right-sided pleuritic chest discomfort, while CTA chest showed no evidence of acute PE.   SIRS criteria met via presenting leukocytosis, tachycardia, tachypnea. Of note, in the absence of any evidence of end organ damage, pt's sepsis does not meet criteria to be considered severe in nature. Also, in the absence of LA level greater than or equal to 4.0, and in the absence of any associated hypotension refractory to IVF's, there are no indications for administration of a 30 mL/kg IVF bolus at this time.  Of note, lactic acid ordered, with result currently pending.  Additional ED work-up/management notable for: She was initiated on azithromycin and Rocephin for community-acquired coverage at [facility] ED, which will be continued.  Of note, blood cultures were collected prior to initiation of these IV antibiotics.  No e/o additional  infectious process at this time, including COVID-19/influenza PCR negative.  We will also check urinalysis to further assess for any additional source of infection.   Plan: CBC w/ diff and CMP in AM. Abx: Continue azithromycin and Rocephin, as above.  Check lactic acid level.  Check urinalysis.  Flutter valve.  Prn tussinex.  Add on procalcitonin level further evaluation and management of resultant acute asthma exacerbation.         #) Acute asthma exacerbation: In the setting of a documented history of moderate persistent asthma: Presentation appears consistent with acute asthma exacerbation in the setting to 3 days of progressive shortness of breath associate with new onset wheezing with increased work of breathing noted.  This appears to be as a consequence of presenting sepsis due to community-acquired pneumonia, as above, with CTA chest showing evidence of right middle lobe infiltrate consistent with pneumonia, demonstrating no additional acute cardiopulmonary process, as further detailed above.   Plan: Scheduled duo nebulizer treatments, prn nebulizers.  Solu-Medrol 80 mg IV twice daily.  Check serum magnesium and phosphorus levels.  Check blood gas.  Further evaluation management of sepsis due to community-acquired pneumonia, as above.         #) Hypokalemia: Presenting serum potassium level 3.3.  She is status post 40 mill equivalents of oral potassium chloride administered at [facility] ED earlier this evening.  Plan: Add on serum magnesium level.  Repeat CMP in the morning.  Monitor on telemetry.            #) Essential Hypertension: documented h/o such, with outpatient antihypertensive regimen including Bystolic.  SBP's in the ED today: 140s to 160s mmHg.   Plan: Close monitoring of subsequent BP via routine VS. resume home beta-blocker.  As needed IV hydralazine.          #) Generalized anxiety disorder: On as needed Xanax as an outpatient, in  the absence of any SSRI or SNRI.   Plan: Continue outpatient as needed Xanax.             #) Hyperlipidemia: documented h/o such. On high intensity atorvastatin as outpatient.    Plan: continue home statin.          DVT prophylaxis: SCD's   Code Status: Full code Family Communication: none Disposition Plan: Per Rounding Team Consults called: none;  Admission status: obs   PLEASE NOTE THAT DRAGON DICTATION SOFTWARE WAS USED IN THE CONSTRUCTION OF THIS NOTE.   [place] DO [facility] [employer] -  7AM   10/26/2021, 6:25 AM

## 2021-10-26 NOTE — Progress Notes (Signed)
  Transition of Care [facility]) Screening Note   Patient Details  Name: Valerie Haney Date of Birth: 1970/08/02   [facility] CM/SW Contact:    Dessa Phi, RN Phone Number: 10/26/2021, 3:59 PM    Transition of Care Department [facility]) has reviewed patient and no TOC needs have been identified at this time. We will continue to monitor patient advancement through interdisciplinary progression rounds. If new patient transition needs arise, please place a TOC consult.

## 2021-10-27 DIAGNOSIS — J189 Pneumonia, unspecified organism: Secondary | ICD-10-CM | POA: Diagnosis not present

## 2021-10-27 LAB — BASIC METABOLIC PANEL
Anion gap: 6 (ref 5–15)
BUN: 17 mg/dL (ref 6–20)
CO2: 21 mmol/L — ABNORMAL LOW (ref 22–32)
Calcium: 8.4 mg/dL — ABNORMAL LOW (ref 8.9–10.3)
Chloride: 109 mmol/L (ref 98–111)
Creatinine, Ser: 0.88 mg/dL (ref 0.44–1.00)
GFR, Estimated: >60 mL/min (ref >60)
Glucose, Bld: 159 mg/dL — ABNORMAL HIGH (ref 70–99)
Potassium: 4.6 mmol/L (ref 3.5–5.1)
Sodium: 136 mmol/L (ref 135–145)

## 2021-10-27 LAB — CBC
HCT: 35.9 % — ABNORMAL LOW (ref 36.0–46.0)
Hemoglobin: 11.9 g/dL — ABNORMAL LOW (ref 12.0–15.0)
MCH: 31.8 pg (ref 26.0–34.0)
MCHC: 33.1 g/dL (ref 30.0–36.0)
MCV: 96 fL (ref 80.0–100.0)
Platelets: 222 10*3/uL (ref 150–400)
RBC: 3.74 MIL/uL — ABNORMAL LOW (ref 3.87–5.11)
RDW: 14.6 % (ref 11.5–15.5)
WBC: 14.3 10*3/uL — ABNORMAL HIGH (ref 4.0–10.5)
nRBC: 0 % (ref 0.0–0.2)

## 2021-10-27 MED ORDER — IPRATROPIUM-ALBUTEROL 0.5-2.5 (3) MG/3ML IN SOLN
3.0000 mL | Freq: Three times a day (TID) | RESPIRATORY_TRACT | Status: DC
  Administered 2021-10-27 – 2021-10-29 (×6): 3 mL via RESPIRATORY_TRACT
  Filled 2021-10-27 (×6): qty 3

## 2021-10-27 MED ORDER — PREDNISONE 20 MG PO TABS
40.0000 mg | ORAL_TABLET | Freq: Every day | ORAL | Status: DC
  Administered 2021-10-28 – 2021-10-29 (×2): 40 mg via ORAL
  Filled 2021-10-27 (×2): qty 2

## 2021-10-27 MED ORDER — ENOXAPARIN SODIUM 40 MG/0.4ML IJ SOSY
40.0000 mg | PREFILLED_SYRINGE | INTRAMUSCULAR | Status: DC
  Administered 2021-10-27 – 2021-10-29 (×3): 40 mg via SUBCUTANEOUS
  Filled 2021-10-27 (×3): qty 0.4

## 2021-10-27 NOTE — Progress Notes (Signed)
PT Cancellation Note  Patient Details Name: Valerie Haney MRN: [id number] DOB: 1970/05/03   Cancelled Treatment:    Reason Eval/Treat Not Completed:  2nd attempt to evaluate patient on today-pt declined to participate- "I have company. I'll do it tomorrow." Will check back another day.    [facility]  Office: [phone] Pager: [phone]

## 2021-10-27 NOTE — Progress Notes (Signed)
Report received from ongoing nurse. Agreed with nurse assessment of patient and will cont to monitor.

## 2021-10-27 NOTE — Progress Notes (Signed)
PT Cancellation Note  Patient Details Name: Valerie Haney MRN: [id number] DOB: [birth year]   Cancelled Treatment:    Reason Eval/Treat Not Completed:  Attempted PT eval-pt currently eating lunch. Will check back as schedule allows.    [facility]  Office: [phone] Pager: [phone]

## 2021-10-27 NOTE — Progress Notes (Signed)
PROGRESS NOTE  Valerie Haney  OEH:[id number] DOB: 10-May-1970 DOA: 10/25/2021 PCP: Penni Bombard, PA   Brief Narrative:  Patient is a 51 year old female with history of hypertension, moderate persistent asthma, hyperlipidemia who presented with complaints of progressive shortness of breath, wheezing, cough, pleuritic right-sided chest pain, edema of lower extremities, fever.  On presentation, she was saturating fine on room air.  Lab work showed elevated leukocytes.  COVID/flu negative.  CTA chest did not show any evidence of PE but showed patchy airspace opacity in the right middle lobe consistent with pneumonia.  Patient was admitted for the management of acute exacerbation with concurrent community-acquired pneumonia.  Started on antibiotics, steroids.  Assessment & Plan:  Principal Problem:   CAP (community acquired pneumonia) Active Problems:   Asthma, chronic, unspecified asthma severity, with acute exacerbation   Essential hypertension   Sepsis ([place])   GAD (generalized anxiety disorder)   HLD (hyperlipidemia)   Asthma attack  Sepsis secondary to community-acquired pneumonia: Presented with 2 to 3 days of progressive shortness of breath, cough, right-sided pleuritic chest pain associated with fever.  CTA chest showed airspace opacity in the right middle lobe.  Presented  with leukocytosis, tachycardia, tachypnea. Continue current antibiotics.  Patient improving symptomatically Has mild leukocytosis which is improving.  Remains afebrile Continue cough medications, flutter valve.  Acute asthma exacerbation/acute hypoxic respiratory failure: History of moderate persistent asthma.  She says she gets attacks 2-3 times a week.  Presented with wheezing, breathing found to be labored on presentation.  Currently requiring 1 to 2 L of oxygen per minute.  Not on oxygen at home.  Continue bronchodilators, steroids.  We will try to wean the oxygen.  Hypokalemia: Supplemented  with  potassium  Hypertension: Takes Bystolic at home.  Continue.  Monitor blood pressure  Generalized  anxiety disorder: Takes Xanax as an outpatient.  Hyperlipidemia: Take lipitor  Obesity: BMI of 33.4  Generalized weakness: We will consult PT.        DVT prophylaxis:Lovenox     Code Status: Full Code  Family Communication: None at bedside  Patient status:Inpatient  Patient is from :Home  Anticipated discharge IB:[facility]  Estimated DC date:1-2 days   Consultants: None  Procedures:None  Antimicrobials:  Anti-infectives (From admission, onward)    Start     Dose/Rate Route Frequency Ordered Stop   10/26/21 1800  azithromycin (ZITHROMAX) 500 mg in sodium chloride 0.9 % 250 mL IVPB        500 mg 250 mL/hr over 60 Minutes Intravenous Every 24 hours 10/25/21 2111     10/26/21 1700  cefTRIAXone (ROCEPHIN) 1 g in sodium chloride 0.9 % 100 mL IVPB  Status:  Discontinued        1 g 200 mL/hr over 30 Minutes Intravenous Every 24 hours 10/25/21 2111 10/26/21 1123   10/26/21 1600  cefTRIAXone (ROCEPHIN) 2 g in sodium chloride 0.9 % 100 mL IVPB        2 g 200 mL/hr over 30 Minutes Intravenous Every 24 hours 10/26/21 1123     10/25/21 1700  cefTRIAXone (ROCEPHIN) 1 g in sodium chloride 0.9 % 100 mL IVPB        1 g 200 mL/hr over 30 Minutes Intravenous  Once 10/25/21 1655 10/25/21 1755   10/25/21 1700  azithromycin (ZITHROMAX) 500 mg in sodium chloride 0.9 % 250 mL IVPB        500 mg 250 mL/hr over 60 Minutes Intravenous  Once 10/25/21 1655 10/25/21 1905  Subjective:  Patient seen and examined at the bedside this morning.  Lying in bed.  Remains weak but feels better today.  She says he still has cough with episode of chest pain.  Denies any worsening shortness of breath.  No wheezing on auscultation. Objective: Vitals:   10/27/21 0408 10/27/21 0500 10/27/21 0906 10/27/21 1051  BP: 123/83   (!) 156/91  Pulse: 66   87  Resp: 18   20  Temp: 97.9 F (36.6 C)      TempSrc:      SpO2: 100%  99% 100%  Weight:  93.6 kg    Height:        Intake/Output Summary (Last 24 hours) at 10/27/2021 1141 Last data filed at 10/26/2021 1852 Gross per 24 hour  Intake 486.21 ml  Output 1050 ml  Net -563.79 ml   Filed Weights   10/25/21 2100 10/26/21 0436 10/27/21 0500  Weight: 89.4 kg 91.2 kg 93.6 kg    Examination:  General exam: Overall comfortable, not in distress,obese, weak HEENT: PERRL Respiratory system: Mildly diminished air entry on the right side, no wheezing or crackles heard today. Cardiovascular system: S1 & S2 heard, RRR.  Gastrointestinal system: Abdomen is nondistended, soft and nontender. Central nervous system: Alert and oriented Extremities: No edema, no clubbing ,no cyanosis Skin: No rashes, no ulcers,no icterus     Data Reviewed: I have personally reviewed following labs and imaging studies  CBC: Recent Labs  Lab 10/25/21 1405 10/26/21 0442 10/27/21 0356  WBC 17.0* 15.1* 14.3*  NEUTROABS 14.2* 13.3*  --   HGB 12.9 12.9 11.9*  HCT 37.1 37.5 35.9*  MCV 93.0 93.3 96.0  PLT 256 223 387   Basic Metabolic Panel: Recent Labs  Lab 10/25/21 1405 10/26/21 0442 10/27/21 0356  NA 135 134* 136  K 3.3* 4.5 4.6  CL 106 107 109  CO2 20* 19* 21*  GLUCOSE 149* 115* 159*  BUN '11 10 17  '$ CREATININE 1.05* 0.69 0.88  CALCIUM 8.6* 9.0 8.4*  MG  --  2.2  --   PHOS  --  2.5  --      Recent Results (from the past 240 hour(s))  Resp Panel by RT-PCR (Flu A&B, Covid) Anterior Nasal Swab     Status: None   Collection Time: 10/25/21  6:50 PM   Specimen: Anterior Nasal Swab  Result Value Ref Range Status   SARS Coronavirus 2 by RT PCR NEGATIVE NEGATIVE Final    Comment: (NOTE) SARS-CoV-2 target nucleic acids are NOT DETECTED.  The SARS-CoV-2 RNA is generally detectable in upper respiratory specimens during the acute phase of infection. The lowest concentration of SARS-CoV-2 viral copies this assay can detect is 138 copies/mL. A  negative result does not preclude SARS-Cov-2 infection and should not be used as the sole basis for treatment or other patient management decisions. A negative result may occur with  improper specimen collection/handling, submission of specimen other than nasopharyngeal swab, presence of viral mutation(s) within the areas targeted by this assay, and inadequate number of viral copies(<138 copies/mL). A negative result must be combined with clinical observations, patient history, and epidemiological information. The expected result is Negative.  Fact Sheet for Patients:  EntrepreneurPulse.com.au  Fact Sheet for Healthcare Providers:  IncredibleEmployment.be  This test is no t yet approved or cleared by the [place] FDA and  has been authorized for detection and/or diagnosis of SARS-CoV-2 by FDA under an Emergency Use Authorization (EUA). This EUA will remain  in effect (  meaning this test can be used) for the duration of the COVID-19 declaration under Section 564(b)(1) of the Act, 21 U.S.C.section 360bbb-3(b)(1), unless the authorization is terminated  or revoked sooner.       Influenza A by PCR NEGATIVE NEGATIVE Final   Influenza B by PCR NEGATIVE NEGATIVE Final    Comment: (NOTE) The Xpert Xpress SARS-CoV-2/FLU/RSV plus assay is intended as an aid in the diagnosis of influenza from Nasopharyngeal swab specimens and should not be used as a sole basis for treatment. Nasal washings and aspirates are unacceptable for Xpert Xpress SARS-CoV-2/FLU/RSV testing.  Fact Sheet for Patients: EntrepreneurPulse.com.au  Fact Sheet for Healthcare Providers: IncredibleEmployment.be  This test is not yet approved or cleared by the [place] FDA and has been authorized for detection and/or diagnosis of SARS-CoV-2 by FDA under an Emergency Use Authorization (EUA). This EUA will remain in effect (meaning this test can  be used) for the duration of the COVID-19 declaration under Section 564(b)(1) of the Act, 21 U.S.C. section 360bbb-3(b)(1), unless the authorization is terminated or revoked.  Performed at [facility], [address], [place], [place] [address]      Radiology Studies: CT Angio Chest PE W and/or Wo Contrast  Result Date: 10/25/2021 CLINICAL DATA:  Central chest pain and shortness of breath. EXAM: CT ANGIOGRAPHY CHEST WITH CONTRAST TECHNIQUE: Multidetector CT imaging of the chest was performed using the standard protocol during bolus administration of intravenous contrast. Multiplanar CT image reconstructions and MIPs were obtained to evaluate the vascular anatomy. RADIATION DOSE REDUCTION: This exam was performed according to the departmental dose-optimization program which includes automated exposure control, adjustment of the mA and/or kV according to patient size and/or use of iterative reconstruction technique. CONTRAST:  169m OMNIPAQUE IOHEXOL 350 MG/ML SOLN COMPARISON:  CT angiogram chest 10/06/2020 FINDINGS: Cardiovascular: Satisfactory opacification of the pulmonary arteries to the segmental level. No evidence of pulmonary embolism. Normal heart size. No pericardial effusion. Mediastinum/Nodes: No enlarged mediastinal, hilar, or axillary lymph nodes. Thyroid gland, trachea, and esophagus demonstrate no significant findings. Lungs/Pleura: There is a small amount of patchy airspace disease in the right middle lobe. There are atelectatic changes in the bilateral lower lobes. There is a small thin walled cyst in the left lower lobe, nonspecific. No pleural effusion or pneumothorax. Upper Abdomen: No acute abnormality. Musculoskeletal: No chest wall abnormality. No acute or significant osseous findings. Review of the MIP images confirms the above findings. IMPRESSION: 1. No evidence for pulmonary embolism. 2. Patchy airspace disease in the right middle lobe compatible with infection.  Follow-up CT recommended in 3 months to confirm resolution. Electronically Signed   By: ARonney AstersM.D.   On: 10/25/2021 16:39   DG Chest Portable 1 View  Result Date: 10/25/2021 CLINICAL DATA:  Shortness of breath EXAM: PORTABLE CHEST 1 VIEW COMPARISON:  10/03/2021 FINDINGS: The heart size and mediastinal contours are within normal limits. Both lungs are clear. The visualized skeletal structures are unremarkable. IMPRESSION: No active disease. Electronically Signed   By: NDavina PokeD.O.   On: 10/25/2021 14:36    Scheduled Meds:  ipratropium-albuterol  3 mL Nebulization Q6H   methylPREDNISolone (SOLU-MEDROL) injection  40 mg Intravenous Q12H   nebivolol  5 mg Oral Daily   Oxcarbazepine  600 mg Oral Daily   Continuous Infusions:  sodium chloride 10 mL/hr at 10/26/21 1501   azithromycin 500 mg (10/26/21 1604)   cefTRIAXone (ROCEPHIN)  IV 2 g (10/26/21 1502)     LOS: 1  day   Shelly Coss, MD [facility] P8/03/2021, 11:41 AM

## 2021-10-27 NOTE — Plan of Care (Signed)
  Problem: Education: Goal: Knowledge of General Education information will improve Description Including pain rating scale, medication(s)/side effects and non-pharmacologic comfort measures Outcome: Progressing   Problem: Health Behavior/Discharge Planning: Goal: Ability to manage health-related needs will improve Outcome: Progressing   

## 2021-10-28 DIAGNOSIS — F411 Generalized anxiety disorder: Secondary | ICD-10-CM | POA: Diagnosis not present

## 2021-10-28 DIAGNOSIS — E669 Obesity, unspecified: Secondary | ICD-10-CM

## 2021-10-28 DIAGNOSIS — J4541 Moderate persistent asthma with (acute) exacerbation: Secondary | ICD-10-CM | POA: Diagnosis not present

## 2021-10-28 DIAGNOSIS — R531 Weakness: Secondary | ICD-10-CM

## 2021-10-28 DIAGNOSIS — R42 Dizziness and giddiness: Secondary | ICD-10-CM

## 2021-10-28 DIAGNOSIS — J189 Pneumonia, unspecified organism: Secondary | ICD-10-CM | POA: Diagnosis not present

## 2021-10-28 DIAGNOSIS — I10 Essential (primary) hypertension: Secondary | ICD-10-CM | POA: Diagnosis not present

## 2021-10-28 LAB — CBC
HCT: 37.1 % (ref 36.0–46.0)
Hemoglobin: 12 g/dL (ref 12.0–15.0)
MCH: 32.2 pg (ref 26.0–34.0)
MCHC: 32.3 g/dL (ref 30.0–36.0)
MCV: 99.5 fL (ref 80.0–100.0)
Platelets: 241 10*3/uL (ref 150–400)
RBC: 3.73 MIL/uL — ABNORMAL LOW (ref 3.87–5.11)
RDW: 14.7 % (ref 11.5–15.5)
WBC: 13 10*3/uL — ABNORMAL HIGH (ref 4.0–10.5)
nRBC: 0 % (ref 0.0–0.2)

## 2021-10-28 MED ORDER — HYDRALAZINE HCL 25 MG PO TABS
25.0000 mg | ORAL_TABLET | Freq: Four times a day (QID) | ORAL | Status: DC | PRN
  Administered 2021-10-28: 25 mg via ORAL
  Filled 2021-10-28: qty 1

## 2021-10-28 MED ORDER — AZITHROMYCIN 250 MG PO TABS: 250.0000 mg | ORAL_TABLET | Freq: Every day | ORAL | 0 refills | Status: DC

## 2021-10-28 MED ORDER — GUAIFENESIN-DM 100-10 MG/5ML PO SYRP
5.0000 mL | ORAL_SOLUTION | ORAL | Status: DC | PRN
  Administered 2021-10-28 – 2021-10-29 (×4): 5 mL via ORAL
  Filled 2021-10-28 (×4): qty 10

## 2021-10-28 MED ORDER — GUAIFENESIN ER 600 MG PO TB12: 600.0000 mg | ORAL_TABLET | Freq: Two times a day (BID) | ORAL | 0 refills | Status: AC

## 2021-10-28 MED ORDER — GUAIFENESIN ER 600 MG PO TB12
600.0000 mg | ORAL_TABLET | Freq: Two times a day (BID) | ORAL | Status: DC
  Administered 2021-10-28 – 2021-10-29 (×2): 600 mg via ORAL
  Filled 2021-10-28 (×2): qty 1

## 2021-10-28 MED ORDER — PREDNISONE 20 MG PO TABS: 40.0000 mg | ORAL_TABLET | Freq: Every day | ORAL | 0 refills | Status: AC

## 2021-10-28 MED ORDER — AMLODIPINE BESYLATE 5 MG PO TABS
5.0000 mg | ORAL_TABLET | Freq: Every day | ORAL | Status: DC
  Administered 2021-10-28 – 2021-10-29 (×2): 5 mg via ORAL
  Filled 2021-10-28 (×2): qty 1

## 2021-10-28 MED ORDER — AMOXICILLIN-POT CLAVULANATE 875-125 MG PO TABS: 1.0000 | ORAL_TABLET | Freq: Two times a day (BID) | ORAL | 0 refills | Status: AC

## 2021-10-28 NOTE — Progress Notes (Signed)
SATURATION QUALIFICATIONS: (This note is used to comply with regulatory documentation for home oxygen)  Patient Saturations on Room Air at Rest = 94%  Patient Saturations on Room Air while Ambulating = 96%  Please briefly explain why patient needs home oxygen:Patients oxygen saturations did not drop below 94 while ambulating on room air.

## 2021-10-28 NOTE — Plan of Care (Addendum)
Patient is resting in bed. No current complaints or concerns.  Patient continue to progress positively. Plans to discharge tomorrow 10/29/21 per attending assessment and overall status. Will cont to monitor patient.

## 2021-10-28 NOTE — Evaluation (Signed)
Physical Therapy One Time Evaluation Patient Details Name: Valerie Haney MRN: [id number] DOB: 24-Feb-1971 Today's Date: 10/28/2021  History of Present Illness  51 year old female with history of hypertension, moderate persistent asthma, hyperlipidemia who presented with complaints of progressive shortness of breath, wheezing, cough, pleuritic right-sided chest pain, edema of lower extremities, fever. Pt admitted 10/25/21 for the management of acute exacerbation with concurrent community-acquired pneumonia.  Clinical Impression  Patient evaluated by Physical Therapy with no further acute PT needs identified. All education has been completed and the patient has no further questions.  Pt mobilizing slowly but able to ambulate in hallway.  SPO2 98% on room air however pt reported dizziness.  BP 175/114 mmHg upon sitting down after ambulating (see mobility section for further details).  See below for any follow-up Physical Therapy or equipment needs. PT is signing off. Thank you for this referral.        Recommendations for follow up therapy are one component of a multi-disciplinary discharge planning process, led by the attending physician.  Recommendations may be updated based on patient status, additional functional criteria and insurance authorization.  Follow Up Recommendations No PT follow up      Assistance Recommended at Discharge None  Patient can return home with the following       Equipment Recommendations None recommended by PT  Recommendations for Other Services       Functional Status Assessment Patient has not had a recent decline in their functional status     Precautions / Restrictions Precautions Precautions: None      Mobility  Bed Mobility Overal bed mobility: Modified Independent                  Transfers Overall transfer level: Modified independent                      Ambulation/Gait Ambulation/Gait assistance: Supervision, Modified  independent (Device/Increase time) Gait Distance (Feet): 220 Feet Assistive device: None Gait Pattern/deviations: Step-through pattern, Decreased stride length Gait velocity: decr     General Gait Details: required increased time, slow pace, SPO2 98% on room air, pt reporting dizziness upon returning to room so obtained BP; elevated at 175/114 mmHg and then 175/110 mmHg upon checking after a couple minutes (RN notified), increased work of breathing after ambulating as well  Financial trader Rankin (Stroke Patients Only)       Balance                                             Pertinent Vitals/Pain Pain Assessment Pain Assessment: No/denies pain    Home Living Family/patient expects to be discharged to:: Private residence Living Arrangements: Alone Available Help at Discharge: Family;Available PRN/intermittently   Home Access: Stairs to enter Entrance Stairs-Rails: Right Entrance Stairs-Number of Steps: 4   Home Layout: One level Home Equipment: None      Prior Function Prior Level of Function : Independent/Modified Independent                     Hand Dominance        Extremity/Trunk Assessment        Lower Extremity Assessment Lower Extremity Assessment: Overall WFL for tasks assessed    Cervical / Trunk Assessment Cervical / Trunk  Assessment: Normal  Communication   Communication: No difficulties  Cognition Arousal/Alertness: Awake/alert Behavior During Therapy: WFL for tasks assessed/performed Overall Cognitive Status: Within Functional Limits for tasks assessed                                          General Comments      Exercises     Assessment/Plan    PT Assessment Patient does not need any further PT services  PT Problem List         PT Treatment Interventions      PT Goals (Current goals can be found in the Care Plan section)  Acute Rehab PT  Goals PT Goal Formulation: All assessment and education complete, DC therapy    Frequency       Co-evaluation               AM-PAC PT "6 Clicks" Mobility  Outcome Measure Help needed turning from your back to your side while in a flat bed without using bedrails?: None Help needed moving from lying on your back to sitting on the side of a flat bed without using bedrails?: None Help needed moving to and from a bed to a chair (including a wheelchair)?: None Help needed standing up from a chair using your arms (e.g., wheelchair or bedside chair)?: None Help needed to walk in hospital room?: None Help needed climbing 3-5 steps with a railing? : A Little 6 Click Score: 23    End of Session Equipment Utilized During Treatment: Gait belt Activity Tolerance: Patient tolerated treatment well Patient left: with call bell/phone within reach;Other (comment) (pt up in room) Nurse Communication: Mobility status PT Visit Diagnosis: Difficulty in walking, not elsewhere classified (R26.2)    Time: 7342-8768 PT Time Calculation (min) (ACUTE ONLY): 14 min   Charges:   PT Evaluation $PT Eval Low Complexity: 1 Low         Kati PT, DPT Physical Therapist Acute Rehabilitation Services Preferred contact method: Secure Chat Weekend Pager Only: [phone] Office: [place] 10/28/2021, 4:00 PM

## 2021-10-28 NOTE — TOC Transition Note (Signed)
Transition of Care [facility]) - CM/SW Discharge Note   Patient Details  Name: Valerie Haney MRN: [id number] Date of Birth: 09/11/1970  [facility] CM/SW Contact:  Dessa Phi, RN Phone Number: 10/28/2021, 11:56 AM   Clinical Narrative: No CM needs.            Patient Goals and CMS Choice        Discharge Placement                       Discharge Plan and Services                                     Social Determinants of Health (SDOH) Interventions     Readmission Risk Interventions     No data to display

## 2021-10-28 NOTE — Progress Notes (Signed)
PROGRESS NOTE  Valerie Haney BWI:[id number] DOB: [birth year]   PCP: Penni Bombard, PA  Patient is from: Home  DOA: 10/25/2021 LOS: 2  Chief complaints Chief Complaint  Patient presents with   Shortness of Breath     Brief Narrative / Interim history: 51 year old F with PMH of moderate persistent asthma, HTN, HLD and chronic pain on opiate presenting with progressive shortness of breath, wheezing, cough and pleuritic right-sided chest pain and admitted for sepsis due to RML pneumonia and COPD exacerbation.  Lab work showed elevated leukocytosis.  COVID and flu negative.  CTA chest negative for PE but patchy airspace disease in RML consistent with pneumonia.  Patient was started on ceftriaxone, azithromycin, systemic steroid and nebulizers, and admitted.  Subjective: Seen and examined earlier this morning.  No major events overnight of this morning.  Continues to endorse chest tightness, wheezing, shortness of breath, dry cough and lightheadedness.  Very anxious to go home.  Objective: Vitals:   10/28/21 1158 10/28/21 1414 10/28/21 1457 10/28/21 1549  BP: (!) 161/96  (!) 175/110 (!) 150/94  Pulse: 60   71  Resp: 18     Temp: 98 F (36.7 C)     TempSrc: Oral     SpO2: 96% 95%    Weight:      Height:        Examination:  GENERAL: No apparent distress.  Nontoxic. HEENT: MMM.  Vision and hearing grossly intact.  NECK: Supple.  No apparent JVD.  RESP:  No IWOB.  Diminished aeration bilaterally.  No wheeze or crackles. CVS:  RRR. Heart sounds normal.  ABD/GI/GU: BS+. Abd soft, NTND.  MSK/EXT:  Moves extremities. No apparent deformity. No edema.  SKIN: no apparent skin lesion or wound NEURO: Awake, alert and oriented appropriately.  No apparent focal neuro deficit. PSYCH: Calm. Normal affect.   Procedures:  None  Microbiology summarized: COVID-19 PCR nonreactive. Influenza PCR nonreactive.  Assessment and plan: Principal Problem:   CAP (community acquired  pneumonia) Active Problems:   Asthma, chronic, unspecified asthma severity, with acute exacerbation   Essential hypertension   Sepsis ([place])   GAD (generalized anxiety disorder)   HLD (hyperlipidemia)   Asthma attack  Sepsis secondary to community-acquired pneumonia: POA. -Continue ceftriaxone and azithromycin.   -Mucolytic's and antitussive  -Encourage incentive telemetry/OOB/PT/OT   Acute asthma exacerbation/acute hypoxic respiratory failure: POA:  -Wean oxygen to room air. -Ambulatory saturation assessment -Continue systemic steroid with scheduled and as needed DuoNebs -Biotics as above   Hypokalemia: Resolved.   Essential Hypertension: -Continue home Bystolic -Add low-dose amlodipine. -P.o. hydralazine as needed   Generalized  anxiety disorder:  -Takes Xanax as an outpatient.   Dizziness/lightheadedness: No focal neuro symptoms. -Orthostatic vitals -OOB/frequent ambulation/PT/OT  Hyperlipidemia:  -Takes lipitor   Generalized weakness:  -PT/OT eval  -Encouraged to get out of the bed  -Ambulate in the hall every 6 hours  Obesity Body mass index is 34.34 kg/m.          DVT prophylaxis:  enoxaparin (LOVENOX) injection 40 mg Start: 10/27/21 1200  Code Status: Full code Family Communication: None at bedside Level of care: Telemetry Status is: Inpatient Remains inpatient appropriate because: Sepsis/asthma exacerbation due to pneumonia   Final disposition: Home Consultants:  None  Sch Meds:  Scheduled Meds:  amLODipine  5 mg Oral Daily   enoxaparin (LOVENOX) injection  40 mg Subcutaneous Q24H   guaiFENesin  600 mg Oral BID   ipratropium-albuterol  3 mL Nebulization TID   nebivolol  5 mg Oral Daily   Oxcarbazepine  600 mg Oral Daily   predniSONE  40 mg Oral Q breakfast   Continuous Infusions:  sodium chloride 10 mL/hr at 10/26/21 1501   azithromycin 500 mg (10/27/21 1757)   cefTRIAXone (ROCEPHIN)  IV 2 g (10/28/21 1558)   PRN Meds:.sodium  chloride, acetaminophen **OR** acetaminophen, ALPRAZolam, guaiFENesin-dextromethorphan, hydrALAZINE, ipratropium-albuterol, oxyCODONE-acetaminophen  Antimicrobials: Anti-infectives (From admission, onward)    Start     Dose/Rate Route Frequency Ordered Stop   10/29/21 0000  azithromycin (ZITHROMAX) 250 MG tablet        250 mg Oral Daily 10/28/21 1211     10/28/21 0000  amoxicillin-clavulanate (AUGMENTIN) 875-125 MG tablet        1 tablet Oral 2 times daily 10/28/21 1211 10/31/21 2359   10/26/21 1800  azithromycin (ZITHROMAX) 500 mg in sodium chloride 0.9 % 250 mL IVPB        500 mg 250 mL/hr over 60 Minutes Intravenous Every 24 hours 10/25/21 2111     10/26/21 1700  cefTRIAXone (ROCEPHIN) 1 g in sodium chloride 0.9 % 100 mL IVPB  Status:  Discontinued        1 g 200 mL/hr over 30 Minutes Intravenous Every 24 hours 10/25/21 2111 10/26/21 1123   10/26/21 1600  cefTRIAXone (ROCEPHIN) 2 g in sodium chloride 0.9 % 100 mL IVPB        2 g 200 mL/hr over 30 Minutes Intravenous Every 24 hours 10/26/21 1123     10/25/21 1700  cefTRIAXone (ROCEPHIN) 1 g in sodium chloride 0.9 % 100 mL IVPB        1 g 200 mL/hr over 30 Minutes Intravenous  Once 10/25/21 1655 10/25/21 1755   10/25/21 1700  azithromycin (ZITHROMAX) 500 mg in sodium chloride 0.9 % 250 mL IVPB        500 mg 250 mL/hr over 60 Minutes Intravenous  Once 10/25/21 1655 10/25/21 1905        I have personally reviewed the following labs and images: CBC: Recent Labs  Lab 10/25/21 1405 10/26/21 0442 10/27/21 0356 10/28/21 0417  WBC 17.0* 15.1* 14.3* 13.0*  NEUTROABS 14.2* 13.3*  --   --   HGB 12.9 12.9 11.9* 12.0  HCT 37.1 37.5 35.9* 37.1  MCV 93.0 93.3 96.0 99.5  PLT 256 223 222 241   BMP &GFR Recent Labs  Lab 10/25/21 1405 10/26/21 0442 10/27/21 0356  NA 135 134* 136  K 3.3* 4.5 4.6  CL 106 107 109  CO2 20* 19* 21*  GLUCOSE 149* 115* 159*  BUN '11 10 17  '$ CREATININE 1.05* 0.69 0.88  CALCIUM 8.6* 9.0 8.4*  MG  --   2.2  --   PHOS  --  2.5  --    Estimated Creatinine Clearance: 84.5 mL/min (by C-G formula based on SCr of 0.88 mg/dL). Liver & Pancreas: Recent Labs  Lab 10/26/21 0442  AST 35  ALT 19  ALKPHOS 80  BILITOT 0.8  PROT 6.9  ALBUMIN 3.9   No results for input(s): "LIPASE", "AMYLASE" in the last 168 hours. No results for input(s): "AMMONIA" in the last 168 hours. Diabetic: No results for input(s): "HGBA1C" in the last 72 hours. No results for input(s): "GLUCAP" in the last 168 hours. Cardiac Enzymes: No results for input(s): "CKTOTAL", "CKMB", "CKMBINDEX", "TROPONINI" in the last 168 hours. No results for input(s): "PROBNP" in the last 8760 hours. Coagulation Profile: No results for input(s): "INR", "PROTIME" in the last 168 hours. Thyroid  Function Tests: No results for input(s): "TSH", "T4TOTAL", "FREET4", "T3FREE", "THYROIDAB" in the last 72 hours. Lipid Profile: No results for input(s): "CHOL", "HDL", "LDLCALC", "TRIG", "CHOLHDL", "LDLDIRECT" in the last 72 hours. Anemia Panel: No results for input(s): "VITAMINB12", "FOLATE", "FERRITIN", "TIBC", "IRON", "RETICCTPCT" in the last 72 hours. Urine analysis:    Component Value Date/Time   COLORURINE STRAW (A) 10/26/2021 0639   APPEARANCEUR CLEAR 10/26/2021 0639   LABSPEC 1.010 10/26/2021 0639   PHURINE 6.0 10/26/2021 0639   GLUCOSEU NEGATIVE 10/26/2021 0639   HGBUR NEGATIVE 10/26/2021 0639   BILIRUBINUR NEGATIVE 10/26/2021 0639   KETONESUR NEGATIVE 10/26/2021 0639   PROTEINUR NEGATIVE 10/26/2021 0639   UROBILINOGEN 0.2 10/23/2013 1003   NITRITE NEGATIVE 10/26/2021 0639   LEUKOCYTESUR NEGATIVE 10/26/2021 0639   Sepsis Labs: Invalid input(s): "PROCALCITONIN", "LACTICIDVEN"  Microbiology: Recent Results (from the past 240 hour(s))  Resp Panel by RT-PCR (Flu A&B, Covid) Anterior Nasal Swab     Status: None   Collection Time: 10/25/21  6:50 PM   Specimen: Anterior Nasal Swab  Result Value Ref Range Status   SARS  Coronavirus 2 by RT PCR NEGATIVE NEGATIVE Final    Comment: (NOTE) SARS-CoV-2 target nucleic acids are NOT DETECTED.  The SARS-CoV-2 RNA is generally detectable in upper respiratory specimens during the acute phase of infection. The lowest concentration of SARS-CoV-2 viral copies this assay can detect is 138 copies/mL. A negative result does not preclude SARS-Cov-2 infection and should not be used as the sole basis for treatment or other patient management decisions. A negative result may occur with  improper specimen collection/handling, submission of specimen other than nasopharyngeal swab, presence of viral mutation(s) within the areas targeted by this assay, and inadequate number of viral copies(<138 copies/mL). A negative result must be combined with clinical observations, patient history, and epidemiological information. The expected result is Negative.  Fact Sheet for Patients:  EntrepreneurPulse.com.au  Fact Sheet for Healthcare Providers:  IncredibleEmployment.be  This test is no t yet approved or cleared by the [place] FDA and  has been authorized for detection and/or diagnosis of SARS-CoV-2 by FDA under an Emergency Use Authorization (EUA). This EUA will remain  in effect (meaning this test can be used) for the duration of the COVID-19 declaration under Section 564(b)(1) of the Act, 21 U.S.C.section 360bbb-3(b)(1), unless the authorization is terminated  or revoked sooner.       Influenza A by PCR NEGATIVE NEGATIVE Final   Influenza B by PCR NEGATIVE NEGATIVE Final    Comment: (NOTE) The Xpert Xpress SARS-CoV-2/FLU/RSV plus assay is intended as an aid in the diagnosis of influenza from Nasopharyngeal swab specimens and should not be used as a sole basis for treatment. Nasal washings and aspirates are unacceptable for Xpert Xpress SARS-CoV-2/FLU/RSV testing.  Fact Sheet for  Patients: EntrepreneurPulse.com.au  Fact Sheet for Healthcare Providers: IncredibleEmployment.be  This test is not yet approved or cleared by the [place] FDA and has been authorized for detection and/or diagnosis of SARS-CoV-2 by FDA under an Emergency Use Authorization (EUA). This EUA will remain in effect (meaning this test can be used) for the duration of the COVID-19 declaration under Section 564(b)(1) of the Act, 21 U.S.C. section 360bbb-3(b)(1), unless the authorization is terminated or revoked.  Performed at [facility], [address] [place], [place] [address]     Radiology Studies: No results found.    Blake Goya T. [place]  If 7PM-7AM, please contact night-coverage [email or web address] 10/28/2021, 4:52 PM

## 2021-10-28 NOTE — Progress Notes (Signed)
PT Cancellation Note  Patient Details Name: Zunairah Devers MRN: [id number] DOB: [birth year]   Cancelled Treatment:    Reason Eval/Treat Not Completed: PT screened, no needs identified, will sign off RN reports pt has been ambulating in room and saturations checked.  RN reports no PT needs identified at this time.  PT to sign off.   Myrtis Hopping [place] 10/28/2021, 9:35 AM Jannette Spanner PT, DPT Physical Therapist [facility] Preferred contact method: Secure Chat Weekend Pager Only: [phone] Office: [phone]

## 2021-10-29 DIAGNOSIS — I10 Essential (primary) hypertension: Secondary | ICD-10-CM | POA: Diagnosis not present

## 2021-10-29 DIAGNOSIS — J4541 Moderate persistent asthma with (acute) exacerbation: Secondary | ICD-10-CM | POA: Diagnosis not present

## 2021-10-29 DIAGNOSIS — A419 Sepsis, unspecified organism: Secondary | ICD-10-CM | POA: Diagnosis not present

## 2021-10-29 DIAGNOSIS — R6520 Severe sepsis without septic shock: Secondary | ICD-10-CM

## 2021-10-29 DIAGNOSIS — J189 Pneumonia, unspecified organism: Secondary | ICD-10-CM | POA: Diagnosis not present

## 2021-10-29 DIAGNOSIS — G894 Chronic pain syndrome: Secondary | ICD-10-CM

## 2021-10-29 MED ORDER — BUDESONIDE-FORMOTEROL FUMARATE 160-4.5 MCG/ACT IN AERO: 2.0000 | INHALATION_SPRAY | Freq: Two times a day (BID) | RESPIRATORY_TRACT | 12 refills | Status: AC

## 2021-10-29 NOTE — Progress Notes (Signed)
Patient ambulated this am. Tolerated fair. Became slightly SOB having to stop mid walk. Noted audible wheezing. Patient ambulated with standby asst. Estimated distance 197f.

## 2021-10-29 NOTE — Discharge Summary (Signed)
Physician Discharge Summary  Lyndell Gillyard ZJI:[id number] DOB: [birth year] DOA: 10/25/2021  PCP: Penni Bombard, PA  Admit date: 10/25/2021 Discharge date: 10/29/2021 Admitted From: Home Disposition: Home Recommendations for Outpatient Follow-up:  Follow up with PCP in 1 week Recommend referral to pulmonology outpatient Reassess blood pressure and adjust antihypertensive meds Check BMP and CBC Please follow up on the following pending results: None  Home Health: None Equipment/Devices: None  Discharge Condition: Stable CODE STATUS: Full code  Follow-up Information     Penni Bombard, [place]. Schedule an appointment as soon as possible for a visit in 1 week(s).   Specialty: Physician Assistant Contact information: [place] [address] [facility] course 51 year old F with PMH of moderate persistent asthma, HTN, HLD and chronic pain on opiate presenting with progressive shortness of breath, wheezing, cough and pleuritic right-sided chest pain and admitted for sepsis due to RML pneumonia and COPD exacerbation.  Reportedly, patient has been taking his Symbicort as needed.  Lab work showed elevated leukocytosis.  COVID and flu negative.  CTA chest negative for PE but patchy airspace disease in RML consistent with pneumonia.  Patient was started on ceftriaxone, azithromycin, systemic steroid and nebulizers, and admitted.  Patient was continued on antibiotics, systemic steroid, scheduled and as needed nebulizers.  Eventually, she was liberated off oxygen and maintained appropriate saturation with ambulation on room air.  She is discharged on p.o. Augmentin, azithromycin and prednisone to complete treatment course.  She has been advised to use his Symbicort twice daily, not as needed.  Recommend ambulatory referral to pulmonology for spirometry and further evaluation.  See individual problem list below for more.   Problems addressed during  this hospitalization Principal Problem:   CAP (community acquired pneumonia) Active Problems:   Asthma, chronic, unspecified asthma severity, with acute exacerbation   Essential hypertension   Sepsis ([place])   GAD (generalized anxiety disorder)   HLD (hyperlipidemia)   Asthma attack   Severe sepsis secondary to community-acquired pneumonia: POA. -Ceftriaxone and azithromycin>> discharged on p.o. Augmentin and azithromycin -Mucolytic's and antitussive    Acute asthma exacerbation/acute hypoxic respiratory failure: POA: Respiratory failure resolved. -Discharged on p.o. prednisone, Symbicort and as needed albuterol -Advised to use Symbicort twice daily, not as needed. -Recommend ambulatory referral to pulmonology   Hypokalemia: Resolved.   Essential Hypertension: BP slightly elevated but improved. -Discharged on home medications -Reassess blood pressure and adjust antihypertensive meds as appropriate   Generalized  anxiety disorder:  -Takes Xanax as an outpatient.   Dizziness/lightheadedness: No focal neuro signs and symptoms.  Orthostatic vitals negative.   Hyperlipidemia:  -Takes lipitor   Generalized weakness: In the setting of acute illness.  Evaluated by therapy and cleared.  At risk for polypharmacy: Patient is on oxycodone and Xanax.  Reportedly not taking phentermine. -Reassess   Obesity Body mass index is 34.34 kg/m.            Vital signs Vitals:   10/29/21 1005 10/29/21 1008 10/29/21 1243 10/29/21 1332  BP: (!) 177/98 (!) 171/100 (!) 151/93   Pulse: 63 71 62   Temp:   97.9 F (36.6 C)   Resp:   16   Height:      Weight:      SpO2:   98% 97%  TempSrc:   Oral   BMI (Calculated):         Discharge exam  GENERAL: No apparent distress.  Nontoxic. HEENT: MMM.  Vision and hearing grossly intact.  NECK: Supple.  No apparent JVD.  RESP:  No IWOB.  Fair aeration bilaterally. CVS:  RRR. Heart sounds normal.  ABD/GI/GU: BS+. Abd soft, NTND.  MSK/EXT:   Moves extremities. No apparent deformity. No edema.  SKIN: no apparent skin lesion or wound NEURO: Awake and alert. Oriented appropriately.  No apparent focal neuro deficit. PSYCH: Calm. Normal affect.   Discharge Instructions Discharge Instructions     Call MD for:  difficulty breathing, headache or visual disturbances   Complete by: As directed    Call MD for:  extreme fatigue   Complete by: As directed    Diet general   Complete by: As directed    Discharge instructions   Complete by: As directed    It has been a pleasure taking care of you!  You were hospitalized due to pneumonia and asthma exacerbation for which you have been started on antibiotic and steroid in addition to inhalers.  We are discharging you on more antibiotic and steroid to complete treatment course.  Please use your Symbicort twice daily regardless of improvement in his symptoms.  Use albuterol as needed for shortness of breath, cough or wheezing.  Follow-up with your primary care doctor in 1 to 2 weeks or sooner if needed.  Be aware that oxycodone and Xanax are addictive and can cause problems including drowsiness, sedation, constipation, impaired judgment, respiratory depression that could potentially lead to death and impaired balance that could increase risk of fall.  We strongly recommend cutting down on these medications or talking to your doctors who prescribe them.  We do not recommend driving, operating machinery or other activity that requires similar mental and physical engagement.     Take care,   Increase activity slowly   Complete by: As directed       Allergies as of 10/29/2021       Reactions   Lisinopril Swelling   Hands and feet swelled        Medication List     TAKE these medications    ALPRAZolam 1 MG tablet Commonly known as: XANAX Take 1 mg by mouth 2 (two) times daily.   amoxicillin-clavulanate 875-125 MG tablet Commonly known as: AUGMENTIN Take 1 tablet by mouth 2 (two)  times daily for 3 days.   atorvastatin 40 MG tablet Commonly known as: LIPITOR Take 40 mg by mouth daily.   azithromycin 250 MG tablet Commonly known as: Zithromax Take 1 tablet (250 mg total) by mouth daily.   budesonide-formoterol 160-4.5 MCG/ACT inhaler Commonly known as: SYMBICORT Inhale 2 puffs into the lungs in the morning and at bedtime. What changed:  when to take this reasons to take this   guaiFENesin 600 MG 12 hr tablet Commonly known as: Mucinex Take 1 tablet (600 mg total) by mouth 2 (two) times daily for 5 days.   nebivolol 5 MG tablet Commonly known as: Bystolic Take 1 tablet (5 mg total) by mouth daily.   OXcarbazepine ER 600 MG Tb24 Take 600 mg by mouth daily.   oxyCODONE-acetaminophen 10-325 MG tablet Commonly known as: PERCOCET Take 1 tablet by mouth in the morning, at noon, in the evening, and at bedtime.   phentermine 37.5 MG tablet Commonly known as: ADIPEX-P Take 37.5 mg by mouth daily before breakfast.   predniSONE 20 MG tablet Commonly known as: DELTASONE Take 2 tablets (40 mg total) by mouth daily with breakfast for 3 days.  Ventolin HFA 108 (90 Base) MCG/ACT inhaler Generic drug: albuterol Inhale 2 puffs into the lungs every 4 (four) hours as needed for wheezing or shortness of breath.   albuterol (2.5 MG/3ML) 0.083% nebulizer solution Commonly known as: PROVENTIL Take 2.5 mg by nebulization every 3 (three) hours as needed for wheezing or shortness of breath.        Consultations: None  Procedures/Studies:   CT Angio Chest PE W and/or Wo Contrast  Result Date: 10/25/2021 CLINICAL DATA:  Central chest pain and shortness of breath. EXAM: CT ANGIOGRAPHY CHEST WITH CONTRAST TECHNIQUE: Multidetector CT imaging of the chest was performed using the standard protocol during bolus administration of intravenous contrast. Multiplanar CT image reconstructions and MIPs were obtained to evaluate the vascular anatomy. RADIATION DOSE REDUCTION:  This exam was performed according to the departmental dose-optimization program which includes automated exposure control, adjustment of the mA and/or kV according to patient size and/or use of iterative reconstruction technique. CONTRAST:  159m OMNIPAQUE IOHEXOL 350 MG/ML SOLN COMPARISON:  CT angiogram chest 10/06/2020 FINDINGS: Cardiovascular: Satisfactory opacification of the pulmonary arteries to the segmental level. No evidence of pulmonary embolism. Normal heart size. No pericardial effusion. Mediastinum/Nodes: No enlarged mediastinal, hilar, or axillary lymph nodes. Thyroid gland, trachea, and esophagus demonstrate no significant findings. Lungs/Pleura: There is a small amount of patchy airspace disease in the right middle lobe. There are atelectatic changes in the bilateral lower lobes. There is a small thin walled cyst in the left lower lobe, nonspecific. No pleural effusion or pneumothorax. Upper Abdomen: No acute abnormality. Musculoskeletal: No chest wall abnormality. No acute or significant osseous findings. Review of the MIP images confirms the above findings. IMPRESSION: 1. No evidence for pulmonary embolism. 2. Patchy airspace disease in the right middle lobe compatible with infection. Follow-up CT recommended in 3 months to confirm resolution. Electronically Signed   By: ARonney AstersM.D.   On: 10/25/2021 16:39   DG Chest Portable 1 View  Result Date: 10/25/2021 CLINICAL DATA:  Shortness of breath EXAM: PORTABLE CHEST 1 VIEW COMPARISON:  10/03/2021 FINDINGS: The heart size and mediastinal contours are within normal limits. Both lungs are clear. The visualized skeletal structures are unremarkable. IMPRESSION: No active disease. Electronically Signed   By: NDavina PokeD.O.   On: 10/25/2021 14:36   DG Chest 2 View  Result Date: 10/03/2021 CLINICAL DATA:  Chest pain. EXAM: CHEST - 2 VIEW COMPARISON:  Chest x-ray 05/02/2019 FINDINGS: The heart size and mediastinal contours are within normal  limits. Both lungs are clear. The visualized skeletal structures are unremarkable. IMPRESSION: No active cardiopulmonary disease. Electronically Signed   By: ARonney AstersM.D.   On: 10/03/2021 23:32       The results of significant diagnostics from this hospitalization (including imaging, microbiology, ancillary and laboratory) are listed below for reference.     Microbiology: Recent Results (from the past 240 hour(s))  Resp Panel by RT-PCR (Flu A&B, Covid) Anterior Nasal Swab     Status: None   Collection Time: 10/25/21  6:50 PM   Specimen: Anterior Nasal Swab  Result Value Ref Range Status   SARS Coronavirus 2 by RT PCR NEGATIVE NEGATIVE Final    Comment: (NOTE) SARS-CoV-2 target nucleic acids are NOT DETECTED.  The SARS-CoV-2 RNA is generally detectable in upper respiratory specimens during the acute phase of infection. The lowest concentration of SARS-CoV-2 viral copies this assay can detect is 138 copies/mL. A negative result does not preclude SARS-Cov-2 infection and should not be used  as the sole basis for treatment or other patient management decisions. A negative result may occur with  improper specimen collection/handling, submission of specimen other than nasopharyngeal swab, presence of viral mutation(s) within the areas targeted by this assay, and inadequate number of viral copies(<138 copies/mL). A negative result must be combined with clinical observations, patient history, and epidemiological information. The expected result is Negative.  Fact Sheet for Patients:  EntrepreneurPulse.com.au  Fact Sheet for Healthcare Providers:  IncredibleEmployment.be  This test is no t yet approved or cleared by the [place] FDA and  has been authorized for detection and/or diagnosis of SARS-CoV-2 by FDA under an Emergency Use Authorization (EUA). This EUA will remain  in effect (meaning this test can be used) for the duration of  the COVID-19 declaration under Section 564(b)(1) of the Act, 21 U.S.C.section 360bbb-3(b)(1), unless the authorization is terminated  or revoked sooner.       Influenza A by PCR NEGATIVE NEGATIVE Final   Influenza B by PCR NEGATIVE NEGATIVE Final    Comment: (NOTE) The Xpert Xpress SARS-CoV-2/FLU/RSV plus assay is intended as an aid in the diagnosis of influenza from Nasopharyngeal swab specimens and should not be used as a sole basis for treatment. Nasal washings and aspirates are unacceptable for Xpert Xpress SARS-CoV-2/FLU/RSV testing.  Fact Sheet for Patients: EntrepreneurPulse.com.au  Fact Sheet for Healthcare Providers: IncredibleEmployment.be  This test is not yet approved or cleared by the [place] FDA and has been authorized for detection and/or diagnosis of SARS-CoV-2 by FDA under an Emergency Use Authorization (EUA). This EUA will remain in effect (meaning this test can be used) for the duration of the COVID-19 declaration under Section 564(b)(1) of the Act, 21 U.S.C. section 360bbb-3(b)(1), unless the authorization is terminated or revoked.  Performed at [facility], [address] [place], [place] [address]      Labs:  CBC: Recent Labs  Lab 10/25/21 1405 10/26/21 0442 10/27/21 0356 10/28/21 0417  WBC 17.0* 15.1* 14.3* 13.0*  NEUTROABS 14.2* 13.3*  --   --   HGB 12.9 12.9 11.9* 12.0  HCT 37.1 37.5 35.9* 37.1  MCV 93.0 93.3 96.0 99.5  PLT 256 223 222 241   BMP &GFR Recent Labs  Lab 10/25/21 1405 10/26/21 0442 10/27/21 0356  NA 135 134* 136  K 3.3* 4.5 4.6  CL 106 107 109  CO2 20* 19* 21*  GLUCOSE 149* 115* 159*  BUN '11 10 17  '$ CREATININE 1.05* 0.69 0.88  CALCIUM 8.6* 9.0 8.4*  MG  --  2.2  --   PHOS  --  2.5  --    Estimated Creatinine Clearance: 84.5 mL/min (by C-G formula based on SCr of 0.88 mg/dL). Liver & Pancreas: Recent Labs  Lab 10/26/21 0442  AST 35  ALT 19  ALKPHOS 80   BILITOT 0.8  PROT 6.9  ALBUMIN 3.9   No results for input(s): "LIPASE", "AMYLASE" in the last 168 hours. No results for input(s): "AMMONIA" in the last 168 hours. Diabetic: No results for input(s): "HGBA1C" in the last 72 hours. No results for input(s): "GLUCAP" in the last 168 hours. Cardiac Enzymes: No results for input(s): "CKTOTAL", "CKMB", "CKMBINDEX", "TROPONINI" in the last 168 hours. No results for input(s): "PROBNP" in the last 8760 hours. Coagulation Profile: No results for input(s): "INR", "PROTIME" in the last 168 hours. Thyroid Function Tests: No results for input(s): "TSH", "T4TOTAL", "FREET4", "T3FREE", "THYROIDAB" in the last 72 hours. Lipid Profile: No results for input(s): "CHOL", "  HDL", "LDLCALC", "TRIG", "CHOLHDL", "LDLDIRECT" in the last 72 hours. Anemia Panel: No results for input(s): "VITAMINB12", "FOLATE", "FERRITIN", "TIBC", "IRON", "RETICCTPCT" in the last 72 hours. Urine analysis:    Component Value Date/Time   COLORURINE STRAW (A) 10/26/2021 0639   APPEARANCEUR CLEAR 10/26/2021 0639   LABSPEC 1.010 10/26/2021 0639   PHURINE 6.0 10/26/2021 0639   GLUCOSEU NEGATIVE 10/26/2021 0639   HGBUR NEGATIVE 10/26/2021 0639   BILIRUBINUR NEGATIVE 10/26/2021 0639   KETONESUR NEGATIVE 10/26/2021 0639   PROTEINUR NEGATIVE 10/26/2021 0639   UROBILINOGEN 0.2 10/23/2013 1003   NITRITE NEGATIVE 10/26/2021 0639   LEUKOCYTESUR NEGATIVE 10/26/2021 0639   Sepsis Labs: Invalid input(s): "PROCALCITONIN", "LACTICIDVEN"   SIGNED:  Mercy Riding, MD  [facility] 10/29/2021, 2:47 PM

## 2021-10-29 NOTE — Progress Notes (Signed)
Accompanied home by family

## 2021-10-29 NOTE — Progress Notes (Signed)
Discharge instructions given and explained to patient, she verbalized understanding, in no distress. Patient wants to eat lunch 1st.

## 2021-11-26 ENCOUNTER — Ambulatory Visit: Payer: Medicaid Other | Admitting: Neurology

## 2021-11-26 ENCOUNTER — Encounter: Payer: Self-pay | Admitting: Neurology

## 2021-11-26 VITALS — BP 170/108 | HR 100 | Ht 65.0 in | Wt 206.0 lb

## 2021-11-26 DIAGNOSIS — I10 Essential (primary) hypertension: Secondary | ICD-10-CM | POA: Diagnosis not present

## 2021-11-26 DIAGNOSIS — G40301 Generalized idiopathic epilepsy and epileptic syndromes, not intractable, with status epilepticus: Secondary | ICD-10-CM | POA: Diagnosis not present

## 2021-11-26 MED ORDER — PROPRANOLOL HCL ER 60 MG PO CP24: 60.0000 mg | ORAL_CAPSULE | Freq: Every day | ORAL | 6 refills | Status: AC
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
## 2021-11-26 NOTE — Patient Instructions (Signed)
Continue with oxcarbazepine XR 600 mg daily Start with propanolol XR 60 mg daily Continue your other medications Continue to follow with PCP Return in 6 months or sooner if worse, at next visit we will obtain a oxcarbazepine level.

## 2021-12-20 ENCOUNTER — Emergency Department (HOSPITAL_BASED_OUTPATIENT_CLINIC_OR_DEPARTMENT_OTHER): Payer: Medicaid Other

## 2021-12-20 ENCOUNTER — Encounter (HOSPITAL_BASED_OUTPATIENT_CLINIC_OR_DEPARTMENT_OTHER): Payer: Self-pay | Admitting: Emergency Medicine

## 2021-12-20 ENCOUNTER — Emergency Department (HOSPITAL_BASED_OUTPATIENT_CLINIC_OR_DEPARTMENT_OTHER)
Admission: EM | Admit: 2021-12-20 | Discharge: 2021-12-20 | Disposition: A | Discharge status: Home / Self Care | Payer: Medicaid Other | Attending: Emergency Medicine | Admitting: Emergency Medicine

## 2021-12-20 ENCOUNTER — Other Ambulatory Visit: Payer: Self-pay

## 2021-12-20 DIAGNOSIS — R12 Heartburn: Secondary | ICD-10-CM | POA: Diagnosis not present

## 2021-12-20 DIAGNOSIS — R072 Precordial pain: Secondary | ICD-10-CM | POA: Diagnosis not present

## 2021-12-20 DIAGNOSIS — R079 Chest pain, unspecified: Secondary | ICD-10-CM | POA: Diagnosis present

## 2021-12-20 DIAGNOSIS — N3289 Other specified disorders of bladder: Secondary | ICD-10-CM | POA: Diagnosis not present

## 2021-12-20 DIAGNOSIS — R0600 Dyspnea, unspecified: Secondary | ICD-10-CM | POA: Insufficient documentation

## 2021-12-20 DIAGNOSIS — H5712 Ocular pain, left eye: Secondary | ICD-10-CM | POA: Insufficient documentation

## 2021-12-20 DIAGNOSIS — R42 Dizziness and giddiness: Secondary | ICD-10-CM | POA: Diagnosis not present

## 2021-12-20 LAB — CBC WITH DIFFERENTIAL/PLATELET
Abs Immature Granulocytes: 0.01 10*3/uL (ref 0.00–0.07)
Basophils Absolute: 0 10*3/uL (ref 0.0–0.1)
Basophils Relative: 0 %
Eosinophils Absolute: 0.1 10*3/uL (ref 0.0–0.5)
Eosinophils Relative: 2 %
HCT: 37.4 % (ref 36.0–46.0)
Hemoglobin: 12.8 g/dL (ref 12.0–15.0)
Immature Granulocytes: 0 %
Lymphocytes Relative: 51 %
Lymphs Abs: 2.3 10*3/uL (ref 0.7–4.0)
MCH: 31.7 pg (ref 26.0–34.0)
MCHC: 34.2 g/dL (ref 30.0–36.0)
MCV: 92.6 fL (ref 80.0–100.0)
Monocytes Absolute: 0.4 10*3/uL (ref 0.1–1.0)
Monocytes Relative: 8 %
Neutro Abs: 1.7 10*3/uL (ref 1.7–7.7)
Neutrophils Relative %: 39 %
Platelets: 335 10*3/uL (ref 150–400)
RBC: 4.04 MIL/uL (ref 3.87–5.11)
RDW: 13.6 % (ref 11.5–15.5)
WBC: 4.5 10*3/uL (ref 4.0–10.5)
nRBC: 0 % (ref 0.0–0.2)

## 2021-12-20 LAB — HEPATIC FUNCTION PANEL
ALT: 16 U/L (ref 0–44)
AST: 25 U/L (ref 15–41)
Albumin: 4.1 g/dL (ref 3.5–5.0)
Alkaline Phosphatase: 81 U/L (ref 38–126)
Bilirubin, Direct: 0.1 mg/dL (ref 0.0–0.2)
Indirect Bilirubin: 0.6 mg/dL (ref 0.3–0.9)
Total Bilirubin: 0.7 mg/dL (ref 0.3–1.2)
Total Protein: 7.5 g/dL (ref 6.5–8.1)

## 2021-12-20 LAB — BASIC METABOLIC PANEL
Anion gap: 8 (ref 5–15)
BUN: 12 mg/dL (ref 6–20)
CO2: 25 mmol/L (ref 22–32)
Calcium: 8.7 mg/dL — ABNORMAL LOW (ref 8.9–10.3)
Chloride: 105 mmol/L (ref 98–111)
Creatinine, Ser: 1.15 mg/dL — ABNORMAL HIGH (ref 0.44–1.00)
GFR, Estimated: 57 mL/min — ABNORMAL LOW (ref >60)
Glucose, Bld: 83 mg/dL (ref 70–99)
Potassium: 3.6 mmol/L (ref 3.5–5.1)
Sodium: 138 mmol/L (ref 135–145)

## 2021-12-20 LAB — TROPONIN I (HIGH SENSITIVITY)
Troponin I (High Sensitivity): 4 ng/L (ref <18)
Troponin I (High Sensitivity): 4 ng/L (ref <18)

## 2021-12-20 LAB — LIPASE, BLOOD: Lipase: 28 U/L (ref 11–51)

## 2021-12-20 MED ORDER — SODIUM CHLORIDE 0.9 % IV BOLUS
500.0000 mL | Freq: Once | INTRAVENOUS | Status: AC
  Administered 2021-12-20: 500 mL via INTRAVENOUS

## 2021-12-20 MED ORDER — IOHEXOL 300 MG/ML  SOLN
80.0000 mL | Freq: Once | INTRAMUSCULAR | Status: AC | PRN
  Administered 2021-12-20: 85 mL via INTRAVENOUS

## 2021-12-20 MED ORDER — HYDROMORPHONE HCL 1 MG/ML IJ SOLN
0.5000 mg | Freq: Once | INTRAMUSCULAR | Status: AC
  Administered 2021-12-20: 0.5 mg via INTRAVENOUS
  Filled 2021-12-20: qty 1

## 2021-12-20 MED ORDER — OXYCODONE-ACETAMINOPHEN 5-325 MG PO TABS
2.0000 | ORAL_TABLET | Freq: Once | ORAL | Status: AC
  Administered 2021-12-20: 2 via ORAL
  Filled 2021-12-20: qty 2

## 2021-12-20 MED ORDER — PANTOPRAZOLE SODIUM 40 MG IV SOLR
40.0000 mg | Freq: Once | INTRAVENOUS | Status: AC
  Administered 2021-12-20: 40 mg via INTRAVENOUS
  Filled 2021-12-20: qty 10

## 2021-12-20 MED ORDER — PANTOPRAZOLE SODIUM 20 MG PO TBEC: 20.0000 mg | DELAYED_RELEASE_TABLET | Freq: Every day | ORAL | 0 refills | Status: DC

## 2021-12-20 MED ORDER — LORAZEPAM 2 MG/ML IJ SOLN
0.5000 mg | Freq: Once | INTRAMUSCULAR | Status: AC
  Administered 2021-12-20: 0.5 mg via INTRAVENOUS
  Filled 2021-12-20: qty 1

## 2021-12-20 NOTE — ED Provider Notes (Signed)
[facility] Provider Note   CSN: [id number] Arrival date & time: 12/20/21  1709     History  Chief Complaint  Patient presents with   Chest Pain    [facility] Haney is a 50 y.o. female.  HPI Patient reports that she started getting sharp chest pains this afternoon.  Reports she went to church this morning and she felt well.  She did not have any symptoms.  She did eat lunch this afternoon.  She reports she got food from [employer].  She reports that she was sitting on her couch and suddenly when she got up she had a severe sharp chest pain in the left chest and got lightheaded and felt like she might pass out.  Little while felt like was hard to take a deep breath.  She reports that the chest pain is easing off.  She reports also afterwards she started getting a throbbing sharp pain in her left eye for a while as well.  No visual change.  No focal weakness numbness or tingling.  However, after being in the emergency department she was resting in the stretcher and had her left arm under her head.  When I came in to check on her then she said her left hand and arm felt like it was tingly to going to sleep but believed it was secondary to having fallen asleep with her head on her arm in a upright position.  Patient reports she did get some chest pains previously that were evaluated in the emergency department.  She denies any history of a blood clot to the lungs or having to take blood thinner medications.  She denies any pain or swelling of her legs.  He denies any significant history of gastroesophageal reflux disease.  She reports she occasionally takes some Tums for heartburn.    Home Medications Prior to Admission medications   Medication Sig Start Date End Date Taking? Authorizing Provider  pantoprazole (PROTONIX) 20 MG tablet Take 1 tablet (20 mg total) by mouth daily. 12/20/21  Yes Charlesetta Shanks, MD  albuterol (PROVENTIL) (2.5 MG/3ML) 0.083% nebulizer  solution Take 2.5 mg by nebulization every 3 (three) hours as needed for wheezing or shortness of breath.    [provider]  albuterol (VENTOLIN HFA) 108 (90 Base) MCG/ACT inhaler Inhale 2 puffs into the lungs every 4 (four) hours as needed for wheezing or shortness of breath.    [provider]  ALPRAZolam Duanne Moron) 1 MG tablet Take 1 mg by mouth 2 (two) times daily. 11/04/20   [provider]  atorvastatin (LIPITOR) 40 MG tablet Take 40 mg by mouth daily. Patient not taking: Reported on 10/26/2021 05/31/16   [provider]  azithromycin (ZITHROMAX) 250 MG tablet Take 1 tablet (250 mg total) by mouth daily. 10/29/21   Mercy Riding, MD  budesonide-formoterol (SYMBICORT) 160-4.5 MCG/ACT inhaler Inhale 2 puffs into the lungs in the morning and at bedtime. 10/29/21   Mercy Riding, MD  OXcarbazepine ER 600 MG TB24 Take 600 mg by mouth daily. Patient not taking: Reported on 10/26/2021 08/13/21 11/26/21  Alric Ran, MD  oxyCODONE-acetaminophen (PERCOCET) 10-325 MG tablet Take 1 tablet by mouth in the morning, at noon, in the evening, and at bedtime.    [provider]  phentermine (ADIPEX-P) 37.5 MG tablet Take 37.5 mg by mouth daily before breakfast. Patient not taking: Reported on 10/26/2021    [provider]  propranolol ER (INDERAL LA) 60 MG 24  hr capsule Take 1 capsule (60 mg total) by mouth daily. 11/26/21 06/24/22  Alric Ran, MD      Allergies    Lisinopril    Review of Systems   Review of Systems  Physical Exam Updated Vital Signs BP (!) 142/90   Pulse 75   Temp 98.4 F (36.9 C) (Oral)   Resp 15   Ht '5\' 5"'$  (1.651 m)   Wt 90.9 kg   SpO2 98%   BMI 33.35 kg/m  Physical Exam Constitutional:      Comments: Patient is alert and nontoxic.  She is anxious in appearance.  Slightly tremulous and hyperventilating.  Well-nourished well-developed.  HENT:     Head: Normocephalic and atraumatic.     Mouth/Throat:     Mouth: Mucous membranes  are moist.     Pharynx: Oropharynx is clear.  Eyes:     Extraocular Movements: Extraocular movements intact.     Conjunctiva/sclera: Conjunctivae normal.     Pupils: Pupils are equal, round, and reactive to light.     Comments: No swelling or proptosis.  Normal pupillary responses.  No photophobia.  Cardiovascular:     Rate and Rhythm: Normal rate and regular rhythm.     Pulses: Normal pulses.     Heart sounds: Normal heart sounds.  Pulmonary:     Effort: Pulmonary effort is normal.     Breath sounds: Normal breath sounds.  Chest:     Chest wall: No tenderness.  Abdominal:     General: There is no distension.     Palpations: Abdomen is soft.     Tenderness: There is no abdominal tenderness. There is no guarding.  Musculoskeletal:        General: No swelling or tenderness. Normal range of motion.     Cervical back: Neck supple.     Right lower leg: No edema.     Left lower leg: No edema.     Comments: Right upper extremity is normal.  No soft tissue abnormalities.  Radial pulses are 2+ and symmetric.  Hands are warm and dry.  Normal range of motion.  Normal intact sensation and strength.  Skin:    General: Skin is warm and dry.  Neurological:     General: No focal deficit present.     Mental Status: She is oriented to person, place, and time.     Motor: No weakness.     Coordination: Coordination normal.  Psychiatric:     Comments: Patient is anxious.     ED Results / Procedures / Treatments   Labs (all labs ordered are listed, but only abnormal results are displayed) Labs Reviewed  BASIC METABOLIC PANEL - Abnormal; Notable for the following components:      Result Value   Creatinine, Ser 1.15 (*)    Calcium 8.7 (*)    GFR, Estimated 57 (*)    All other components within normal limits  CBC WITH DIFFERENTIAL/PLATELET  HEPATIC FUNCTION PANEL  LIPASE, BLOOD  TROPONIN I (HIGH SENSITIVITY)  TROPONIN I (HIGH SENSITIVITY)    EKG EKG Interpretation  Date/Time:  Sunday  December 20 2021 17:25:46 EDT Ventricular Rate:  73 PR Interval:  126 QRS Duration: 88 QT Interval:  434 QTC Calculation: 478 R Axis:   -25 Text Interpretation: Normal sinus rhythm Moderate voltage criteria for LVH, may be normal variant ( R in aVL , Cornell product ) T wave abnormality, consider inferolateral ischemia Prolonged QT Abnormal ECG When compared with ECG of 25-Oct-2021 13:30,  PREVIOUS ECG IS PRESENT no sig change from previous Confirmed by Charlesetta Shanks [id number]) on 12/20/2021 5:47:51 PM  Radiology CT Angio Chest/Abd/Pel for Dissection W and/or W/WO  Result Date: 12/20/2021 CLINICAL DATA:  Acute aortic syndrome suspected. EXAM: CT ANGIOGRAPHY CHEST, ABDOMEN AND PELVIS TECHNIQUE: Non-contrast CT of the chest was initially obtained. Multidetector CT imaging through the chest, abdomen and pelvis was performed using the standard protocol during bolus administration of intravenous contrast. Multiplanar reconstructed images and MIPs were obtained and reviewed to evaluate the vascular anatomy. RADIATION DOSE REDUCTION: This exam was performed according to the departmental dose-optimization program which includes automated exposure control, adjustment of the mA and/or kV according to patient size and/or use of iterative reconstruction technique. CONTRAST:  50m OMNIPAQUE IOHEXOL 300 MG/ML  SOLN COMPARISON:  October 25, 2021 FINDINGS: CTA CHEST FINDINGS Cardiovascular: Preferential opacification of the thoracic aorta. No evidence of thoracic aortic aneurysm or dissection. Normal heart size. No pericardial effusion. Mediastinum/Nodes: No enlarged mediastinal, hilar, or axillary lymph nodes. Thyroid gland, trachea, and esophagus demonstrate no significant findings. Lungs/Pleura: Lungs are clear. No pleural effusion or pneumothorax. Musculoskeletal: No chest wall abnormality. No acute or significant osseous findings. Review of the MIP images confirms the above findings. CTA ABDOMEN AND PELVIS FINDINGS  VASCULAR Aorta: Normal caliber aorta without aneurysm, dissection, vasculitis or significant stenosis. Celiac: Patent without evidence of aneurysm, dissection, vasculitis or significant stenosis. SMA: Patent without evidence of aneurysm, dissection, vasculitis or significant stenosis. Renals: Both renal arteries are patent without evidence of aneurysm, dissection, vasculitis, fibromuscular dysplasia or significant stenosis. IMA: Patent without evidence of aneurysm, dissection, vasculitis or significant stenosis. Inflow: Patent without evidence of aneurysm, dissection, vasculitis or significant stenosis. Veins: No obvious venous abnormality within the limitations of this arterial phase study. Review of the MIP images confirms the above findings. NON-VASCULAR Hepatobiliary: Hepatic steatosis.  Normal gallbladder. Pancreas: Unremarkable. No pancreatic ductal dilatation or surrounding inflammatory changes. Spleen: Normal in size without focal abnormality. Adrenals/Urinary Tract: Adrenal glands are unremarkable. Kidneys are normal, without renal calculi, focal lesion, or hydronephrosis. Bladder is unremarkable. Stomach/Bowel: Stomach is within normal limits. Appendix appears normal. No evidence of bowel wall thickening, distention, or inflammatory changes. Lymphatic: No evidence of lymphadenopathy. Reproductive: Status post hysterectomy. Apparent multiloculated cystic structure in the left adnexa. Other: No abdominal wall hernia or abnormality. No abdominopelvic ascites. Musculoskeletal: No acute or significant osseous findings. Posttraumatic/postsurgical changes in the left femur Review of the MIP images confirms the above findings. IMPRESSION: 1. No evidence of thoracic or abdominal aortic aneurysm or dissection. Tortuosity aorta. 2. Hepatic steatosis. 3. Post hysterectomy. Apparent multiloculated cystic structure in the left adnexa. Further evaluation with pelvic ultrasound may be considered. Electronically Signed    By: DFidela SalisburyM.D.   On: 12/20/2021 20:12   DG Chest Port 1 View  Result Date: 12/20/2021 CLINICAL DATA:  Chest pain. EXAM: PORTABLE CHEST 1 VIEW COMPARISON:  October 25, 2021 FINDINGS: Tortuosity of the aorta. Cardiomediastinal silhouette is normal. Mediastinal contours appear intact. There is no evidence of focal airspace consolidation, pleural effusion or pneumothorax. Osseous structures are without acute abnormality. Soft tissues are grossly normal. IMPRESSION: No active disease. Electronically Signed   By: DFidela SalisburyM.D.   On: 12/20/2021 18:07    Procedures Procedures    Medications Ordered in ED Medications  pantoprazole (PROTONIX) injection 40 mg (40 mg Intravenous Given 12/20/21 1832)  HYDROmorphone (DILAUDID) injection 0.5 mg (0.5 mg Intravenous Given 12/20/21 1836)  sodium chloride 0.9 % bolus 500 mL (0 mLs  Intravenous Stopped 12/20/21 2114)  LORazepam (ATIVAN) injection 0.5 mg (0.5 mg Intravenous Given 12/20/21 1958)  iohexol (OMNIPAQUE) 300 MG/ML solution 80 mL (85 mLs Intravenous Contrast Given 12/20/21 1952)  oxyCODONE-acetaminophen (PERCOCET/ROXICET) 5-325 MG per tablet 2 tablet (2 tablets Oral Given 12/20/21 2127)    ED Course/ Medical Decision Making/ A&P                           Medical Decision Making Amount and/or Complexity of Data Reviewed Labs: ordered. Radiology: ordered.  Risk Prescription drug management.   Patient presents with acute onset of fairly severe left-sided chest pain with radiation towards the arm and atypical ocular symptoms.  No visual loss or double vision.  Some concurrent lancinating eye pain without other symptoms.  At this time with patient's chest pain and reported shortness of breath and possibly near syncope, concern for ACS\PE\dissection\pneumonia.  Other consideration for esophageal spasm\GERD\biliary colic\pancreatitis.  Will initiate broad diagnostic evaluation.  Plan to treat for pain with Protonix and  Dilaudid.  Patient's vital signs remained stable.  She does not have hypoxia, respiratory distress.  She does have moderate hypertension, 60/106, upon initiating treatment but with pain medications blood pressure improving.  Patient does reports she did not take her pressure medications today.  With pain and anxiety treatment with Dilaudid and Ativan, patient's blood pressure improved to 140s/90s.   I personally reviewed images of chest x-ray.  No pneumothorax or evident consolidations.  Radiology interpretation tortuous aorta without acute findings.  Due to concern for severity of the patient's pain, left-sided with radiation into her arm with hypertension upon presentation, will proceed with CT to rule out dissection.  CT scan reviewed by radiology shows no evidence of dissection.  EKG personally reviewed by myself does not show acute ischemic changes and troponins are normal.  At this time with patient improved with decreased pain, normal diagnostic evaluation and blood pressure improved, I do feel she is stable for discharge.  We discussed possibility of pleurisy, esophageal spasm, GERD, musculoskeletal pain and the need for close follow-up with her PCP for further cardiac diagnostic testing if indicated upon reassessment.  Patient does have ACS risk factors but at this time based on work-up and resolution of symptoms does not appear to have acute ischemic event today.  Recommended initiating Prilosec and following reflux diet to see if symptoms are resolved, careful return precautions reviewed.  Patient counseled on importance of compliance with hypertension medications and management of hypertension for avoidance of long-term complications of hypertension.        Final Clinical Impression(s) / ED Diagnoses Final diagnoses:  Precordial pain  Dyspnea, unspecified type    Rx / DC Orders ED Discharge Orders          Ordered    pantoprazole (PROTONIX) 20 MG tablet  Daily        12/20/21  2118              Charlesetta Shanks, MD 12/27/21 1718

## 2021-12-20 NOTE — Discharge Instructions (Addendum)
1.  Be sure to take your blood pressure medications as prescribed. 2.  You have been given a prescription for Protonix.  Take this medication daily for the next 30 days.  See if your symptoms resolved.  Follow dietary recommendations for gastroesophageal reflux disease. 3.  At this time your labs do not show any evidence of a heart attack.  However, since you have a chest pain, follow-up with your doctor for recheck and further diagnostic testing if needed. 4.  Return to the emergency department if you get new worsening or concerning symptoms.

## 2021-12-20 NOTE — ED Triage Notes (Signed)
Pt c/o mid CP and sharp pain to LT eye x 2 hrs

## 2021-12-20 NOTE — ED Notes (Signed)
Patient transported to CT 

## 2022-01-11 ENCOUNTER — Other Ambulatory Visit: Payer: Self-pay

## 2022-01-11 ENCOUNTER — Emergency Department (HOSPITAL_BASED_OUTPATIENT_CLINIC_OR_DEPARTMENT_OTHER)
Admission: EM | Admit: 2022-01-11 | Discharge: 2022-01-11 | Disposition: A | Discharge status: Home / Self Care | Payer: No Typology Code available for payment source | Attending: Emergency Medicine | Admitting: Emergency Medicine

## 2022-01-11 ENCOUNTER — Encounter (HOSPITAL_BASED_OUTPATIENT_CLINIC_OR_DEPARTMENT_OTHER): Payer: Self-pay | Admitting: *Deleted

## 2022-01-11 ENCOUNTER — Emergency Department (HOSPITAL_BASED_OUTPATIENT_CLINIC_OR_DEPARTMENT_OTHER): Payer: No Typology Code available for payment source

## 2022-01-11 DIAGNOSIS — Y92410 Unspecified street and highway as the place of occurrence of the external cause: Secondary | ICD-10-CM | POA: Insufficient documentation

## 2022-01-11 DIAGNOSIS — M25552 Pain in left hip: Secondary | ICD-10-CM | POA: Insufficient documentation

## 2022-01-11 MED ORDER — METHOCARBAMOL 500 MG PO TABS
1000.0000 mg | ORAL_TABLET | Freq: Once | ORAL | Status: AC
  Administered 2022-01-11: 1000 mg via ORAL
  Filled 2022-01-11: qty 2
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
## 2022-01-11 NOTE — Discharge Instructions (Signed)
Your x-ray did not show any fractures.  Please take Tylenol and ibuprofen as discussed below.  Please follow-up with your orthopedist and primary care doctor.  Please use Tylenol or ibuprofen for pain.  You may use 600 mg ibuprofen every 6 hours or 1000 mg of Tylenol every 6 hours.  You may choose to alternate between the 2.  This would be most effective.  Not to exceed 4 g of Tylenol within 24 hours.  Not to exceed 3200 mg ibuprofen 24 hours.

## 2022-06-09 ENCOUNTER — Ambulatory Visit: Payer: Medicaid Other | Admitting: Neurology

## 2022-06-09 ENCOUNTER — Encounter: Payer: Self-pay | Admitting: Neurology

## 2022-08-11 ENCOUNTER — Other Ambulatory Visit: Payer: Self-pay

## 2022-08-11 ENCOUNTER — Emergency Department (HOSPITAL_BASED_OUTPATIENT_CLINIC_OR_DEPARTMENT_OTHER)
Admission: EM | Admit: 2022-08-11 | Discharge: 2022-08-11 | Disposition: A | Discharge status: Home / Self Care | Payer: Medicaid Other | Attending: Emergency Medicine | Admitting: Emergency Medicine

## 2022-08-11 ENCOUNTER — Encounter (HOSPITAL_BASED_OUTPATIENT_CLINIC_OR_DEPARTMENT_OTHER): Payer: Self-pay | Admitting: Emergency Medicine

## 2022-08-11 DIAGNOSIS — S161XXA Strain of muscle, fascia and tendon at neck level, initial encounter: Secondary | ICD-10-CM | POA: Diagnosis not present

## 2022-08-11 DIAGNOSIS — Y92410 Unspecified street and highway as the place of occurrence of the external cause: Secondary | ICD-10-CM | POA: Insufficient documentation

## 2022-08-11 DIAGNOSIS — I509 Heart failure, unspecified: Secondary | ICD-10-CM | POA: Diagnosis not present

## 2022-08-11 DIAGNOSIS — S199XXA Unspecified injury of neck, initial encounter: Secondary | ICD-10-CM | POA: Diagnosis present

## 2022-08-11 DIAGNOSIS — I110 Hypertensive heart disease with heart failure: Secondary | ICD-10-CM | POA: Insufficient documentation

## 2022-08-11 DIAGNOSIS — Z79899 Other long term (current) drug therapy: Secondary | ICD-10-CM | POA: Insufficient documentation

## 2022-08-11 DIAGNOSIS — Z7951 Long term (current) use of inhaled steroids: Secondary | ICD-10-CM | POA: Diagnosis not present

## 2022-08-11 DIAGNOSIS — J45909 Unspecified asthma, uncomplicated: Secondary | ICD-10-CM | POA: Insufficient documentation

## 2022-08-11 MED ORDER — NAPROXEN 500 MG PO TABS: 500.0000 mg | ORAL_TABLET | Freq: Two times a day (BID) | ORAL | 0 refills | Status: DC

## 2022-08-11 MED ORDER — NAPROXEN 250 MG PO TABS
500.0000 mg | ORAL_TABLET | Freq: Once | ORAL | Status: AC
  Administered 2022-08-11: 500 mg via ORAL
  Filled 2022-08-11: qty 2

## 2022-08-11 MED ORDER — METHOCARBAMOL 500 MG PO TABS: 500.0000 mg | ORAL_TABLET | Freq: Two times a day (BID) | ORAL | 0 refills | Status: DC
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
## 2022-08-11 NOTE — ED Notes (Signed)
Pt ambulatory in triage. 
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
## 2022-08-11 NOTE — ED Notes (Signed)
Pt refused last vitals because grandtg was ready to go

## 2022-08-16 ENCOUNTER — Encounter (HOSPITAL_BASED_OUTPATIENT_CLINIC_OR_DEPARTMENT_OTHER): Payer: Self-pay | Admitting: Emergency Medicine

## 2022-08-16 ENCOUNTER — Other Ambulatory Visit: Payer: Self-pay

## 2022-08-16 ENCOUNTER — Emergency Department (HOSPITAL_BASED_OUTPATIENT_CLINIC_OR_DEPARTMENT_OTHER)
Admission: EM | Admit: 2022-08-16 | Discharge: 2022-08-17 | Disposition: A | Discharge status: Home / Self Care | Payer: Medicaid Other | Attending: Emergency Medicine | Admitting: Emergency Medicine

## 2022-08-16 ENCOUNTER — Emergency Department (HOSPITAL_BASED_OUTPATIENT_CLINIC_OR_DEPARTMENT_OTHER): Payer: Medicaid Other

## 2022-08-16 DIAGNOSIS — R072 Precordial pain: Secondary | ICD-10-CM | POA: Diagnosis not present

## 2022-08-16 DIAGNOSIS — Z91148 Patient's other noncompliance with medication regimen for other reason: Secondary | ICD-10-CM | POA: Insufficient documentation

## 2022-08-16 DIAGNOSIS — I110 Hypertensive heart disease with heart failure: Secondary | ICD-10-CM | POA: Insufficient documentation

## 2022-08-16 DIAGNOSIS — Z8673 Personal history of transient ischemic attack (TIA), and cerebral infarction without residual deficits: Secondary | ICD-10-CM | POA: Insufficient documentation

## 2022-08-16 DIAGNOSIS — I509 Heart failure, unspecified: Secondary | ICD-10-CM | POA: Insufficient documentation

## 2022-08-16 DIAGNOSIS — I10 Essential (primary) hypertension: Secondary | ICD-10-CM

## 2022-08-16 DIAGNOSIS — I7789 Other specified disorders of arteries and arterioles: Secondary | ICD-10-CM | POA: Insufficient documentation

## 2022-08-16 DIAGNOSIS — R55 Syncope and collapse: Secondary | ICD-10-CM | POA: Diagnosis present

## 2022-08-16 DIAGNOSIS — J45909 Unspecified asthma, uncomplicated: Secondary | ICD-10-CM | POA: Diagnosis not present

## 2022-08-16 LAB — CBG MONITORING, ED: Glucose-Capillary: 114 mg/dL — ABNORMAL HIGH (ref 70–99)

## 2022-08-16 LAB — CBC
HCT: 35.5 % — ABNORMAL LOW (ref 36.0–46.0)
Hemoglobin: 12.2 g/dL (ref 12.0–15.0)
MCH: 35 pg — ABNORMAL HIGH (ref 26.0–34.0)
MCHC: 34.4 g/dL (ref 30.0–36.0)
MCV: 101.7 fL — ABNORMAL HIGH (ref 80.0–100.0)
Platelets: 303 10*3/uL (ref 150–400)
RBC: 3.49 MIL/uL — ABNORMAL LOW (ref 3.87–5.11)
RDW: 16.1 % — ABNORMAL HIGH (ref 11.5–15.5)
WBC: 5.6 10*3/uL (ref 4.0–10.5)
nRBC: 0 % (ref 0.0–0.2)

## 2022-08-16 LAB — BASIC METABOLIC PANEL
Anion gap: 9 (ref 5–15)
BUN: 6 mg/dL (ref 6–20)
CO2: 22 mmol/L (ref 22–32)
Calcium: 8.5 mg/dL — ABNORMAL LOW (ref 8.9–10.3)
Chloride: 104 mmol/L (ref 98–111)
Creatinine, Ser: 0.86 mg/dL (ref 0.44–1.00)
GFR, Estimated: >60 mL/min (ref >60)
Glucose, Bld: 106 mg/dL — ABNORMAL HIGH (ref 70–99)
Potassium: 3.8 mmol/L (ref 3.5–5.1)
Sodium: 135 mmol/L (ref 135–145)

## 2022-08-16 LAB — BRAIN NATRIURETIC PEPTIDE: B Natriuretic Peptide: 61.5 pg/mL (ref 0.0–100.0)

## 2022-08-16 MED ORDER — HYDRALAZINE HCL 20 MG/ML IJ SOLN
2.0000 mg | Freq: Once | INTRAMUSCULAR | Status: AC
  Administered 2022-08-16: 2 mg via INTRAVENOUS
  Filled 2022-08-16: qty 1

## 2022-08-16 NOTE — ED Triage Notes (Signed)
Patient arrived via POV c/o LOC approximately 15 min pta. Patient states increased BP at home. Patient states dizziness here. Patient is AO x 4, VS w/ elevated BP, slow gait.

## 2022-08-16 NOTE — ED Notes (Signed)
Patient transported to CT 

## 2022-08-16 NOTE — ED Notes (Signed)
Pt states she had a syncopal episdode after coming out of bath tub.   States she has felt dizziness with elevated BP tonight.  Admits to not taking her BP meds as directed.  States it has prob been a month since she has taken it.

## 2022-08-17 ENCOUNTER — Emergency Department (HOSPITAL_BASED_OUTPATIENT_CLINIC_OR_DEPARTMENT_OTHER): Payer: Medicaid Other

## 2022-08-17 LAB — URINALYSIS, ROUTINE W REFLEX MICROSCOPIC
Bilirubin Urine: NEGATIVE
Glucose, UA: NEGATIVE mg/dL
Hgb urine dipstick: NEGATIVE
Ketones, ur: NEGATIVE mg/dL
Leukocytes,Ua: NEGATIVE
Nitrite: NEGATIVE
Protein, ur: NEGATIVE mg/dL
Specific Gravity, Urine: 1.01 (ref 1.005–1.030)
pH: 6 (ref 5.0–8.0)

## 2022-08-17 LAB — RAPID URINE DRUG SCREEN, HOSP PERFORMED
Amphetamines: NOT DETECTED
Barbiturates: NOT DETECTED
Benzodiazepines: NOT DETECTED
Cocaine: NOT DETECTED
Opiates: NOT DETECTED
Tetrahydrocannabinol: NOT DETECTED

## 2022-08-17 LAB — TROPONIN I (HIGH SENSITIVITY)
Troponin I (High Sensitivity): 6 ng/L (ref <18)
Troponin I (High Sensitivity): 6 ng/L (ref <18)

## 2022-08-17 MED ORDER — IOHEXOL 350 MG/ML SOLN
75.0000 mL | Freq: Once | INTRAVENOUS | Status: AC | PRN
  Administered 2022-08-17: 75 mL via INTRAVENOUS

## 2022-08-17 MED ORDER — ALUM & MAG HYDROXIDE-SIMETH 200-200-20 MG/5ML PO SUSP
30.0000 mL | Freq: Once | ORAL | Status: AC
  Administered 2022-08-17: 30 mL via ORAL
  Filled 2022-08-17: qty 30

## 2022-08-17 MED ORDER — ACETAMINOPHEN 500 MG PO TABS
1000.0000 mg | ORAL_TABLET | Freq: Once | ORAL | Status: AC
  Administered 2022-08-17: 1000 mg via ORAL
  Filled 2022-08-17: qty 2

## 2022-08-17 NOTE — ED Notes (Signed)
Patient transported to CT 

## 2022-08-17 NOTE — ED Provider Notes (Signed)
[place] [facility] Provider Note   CSN: [id number] Arrival date & time: 08/16/22  2257     History  Chief Complaint  Patient presents with   Loss of Consciousness   Hypertension    Valerie Haney is a 52 y.o. female.  The history is provided by the patient.  Loss of Consciousness Episode history:  Single Most recent episode:  Today Timing:  Constant Progression:  Resolved Chronicity:  New Context: not blood draw and not bowel movement   Witnessed: no   Relieved by:  Nothing Worsened by:  Nothing Ineffective treatments:  None tried Associated symptoms: no confusion, no diaphoresis, no difficulty breathing, no dizziness, no fever, no focal sensory loss, no headaches, no malaise/fatigue, no nausea, no palpitations, no recent fall, no recent injury, no shortness of breath, no vomiting and no weakness   Risk factors: no congenital heart disease   Hypertension Pertinent negatives include no headaches and no shortness of breath.  Patient with HTN presents with syncope in the shower and elevated BP and CP that lasted 3-4 minutes, 2 episodes.  Sharp central and non-radiating.  No weakness no numbness no changes in vision or speech or gait.  Patient has not taken her Bp medication in more than a year.  Reports only medication she is taking at this time is pain medication and anxiety medication.      Past Medical History:  Diagnosis Date   Asthma    Blood transfusion    Cardiac arrhythmia    CHF (congestive heart failure) (HCC)    Hypertension    Migraine    Seizure (HCC)    Stroke (HCC)      Home Medications Prior to Admission medications   Medication Sig Start Date End Date Taking? Authorizing Provider  albuterol (PROVENTIL) (2.5 MG/3ML) 0.083% nebulizer solution Take 2.5 mg by nebulization every 3 (three) hours as needed for wheezing or shortness of breath.    [provider]  albuterol (VENTOLIN HFA) 108 (90 Base) MCG/ACT  inhaler Inhale 2 puffs into the lungs every 4 (four) hours as needed for wheezing or shortness of breath.    [provider]  ALPRAZolam Prudy Feeler) 1 MG tablet Take 1 mg by mouth 2 (two) times daily. 11/04/20   [provider]  atorvastatin (LIPITOR) 40 MG tablet Take 40 mg by mouth daily. Patient not taking: Reported on 10/26/2021 05/31/16   [provider]  azithromycin (ZITHROMAX) 250 MG tablet Take 1 tablet (250 mg total) by mouth daily. 10/29/21   Almon Hercules, MD  budesonide-formoterol (SYMBICORT) 160-4.5 MCG/ACT inhaler Inhale 2 puffs into the lungs in the morning and at bedtime. 10/29/21   Almon Hercules, MD  methocarbamol (ROBAXIN) 500 MG tablet Take 1 tablet (500 mg total) by mouth 2 (two) times daily. 08/11/22   Darrick Grinder, PA-C  naproxen (NAPROSYN) 500 MG tablet Take 1 tablet (500 mg total) by mouth 2 (two) times daily. 08/11/22   Darrick Grinder, PA-C  OXcarbazepine ER 600 MG TB24 Take 600 mg by mouth daily. Patient not taking: Reported on 10/26/2021 08/13/21 11/26/21  Windell Norfolk, MD  oxyCODONE-acetaminophen (PERCOCET) 10-325 MG tablet Take 1 tablet by mouth in the morning, at noon, in the evening, and at bedtime.    [provider]  pantoprazole (PROTONIX) 20 MG tablet Take 1 tablet (20 mg total) by mouth daily. 12/20/21   Arby Barrette, MD  phentermine (ADIPEX-P) 37.5 MG tablet Take 37.5 mg by mouth  daily before breakfast. Patient not taking: Reported on 10/26/2021    [provider]  propranolol ER (INDERAL LA) 60 MG 24 hr capsule Take 1 capsule (60 mg total) by mouth daily. 11/26/21 06/24/22  Windell Norfolk, MD      Allergies    Lisinopril    Review of Systems   Review of Systems  Constitutional:  Negative for diaphoresis, fever and malaise/fatigue.  HENT:  Negative for facial swelling.   Eyes:  Negative for redness.  Respiratory:  Negative for shortness of breath.   Cardiovascular:  Positive for syncope. Negative for palpitations.   Gastrointestinal:  Negative for nausea and vomiting.  Neurological:  Negative for dizziness, weakness and headaches.  Psychiatric/Behavioral:  Negative for confusion.   All other systems reviewed and are negative.   Physical Exam Updated Vital Signs BP 118/74 (BP Location: Left Arm)   Pulse 80   Temp 98 F (36.7 C) (Temporal)   Resp 15   Ht 5\' 5"  (1.651 m)   Wt 84.8 kg   SpO2 99%   BMI 31.12 kg/m  Physical Exam Vitals and nursing note reviewed.  Constitutional:      General: She is not in acute distress.    Appearance: Normal appearance. She is well-developed.  HENT:     Head: Normocephalic and atraumatic.     Nose: Nose normal.  Eyes:     Pupils: Pupils are equal, round, and reactive to light.  Cardiovascular:     Rate and Rhythm: Normal rate and regular rhythm.     Pulses: Normal pulses.     Heart sounds: Normal heart sounds.  Pulmonary:     Effort: Pulmonary effort is normal. No respiratory distress.     Breath sounds: Normal breath sounds.  Abdominal:     General: Bowel sounds are normal. There is no distension.     Palpations: Abdomen is soft.     Tenderness: There is no abdominal tenderness. There is no guarding or rebound.  Genitourinary:    Vagina: No vaginal discharge.  Musculoskeletal:        General: Normal range of motion.     Cervical back: Normal range of motion and neck supple.  Skin:    General: Skin is warm and dry.     Capillary Refill: Capillary refill takes less than 2 seconds.     Findings: No erythema or rash.  Neurological:     General: No focal deficit present.     Mental Status: She is alert and oriented to person, place, and time.     Deep Tendon Reflexes: Reflexes normal.  Psychiatric:        Mood and Affect: Mood normal.     ED Results / Procedures / Treatments   Labs (all labs ordered are listed, but only abnormal results are displayed) Results for orders placed or performed during the hospital encounter of 08/16/22  Basic  metabolic panel  Result Value Ref Range   Sodium 135 135 - 145 mmol/L   Potassium 3.8 3.5 - 5.1 mmol/L   Chloride 104 98 - 111 mmol/L   CO2 22 22 - 32 mmol/L   Glucose, Bld 106 (H) 70 - 99 mg/dL   BUN 6 6 - 20 mg/dL   Creatinine, Ser 2.95 0.44 - 1.00 mg/dL   Calcium 8.5 (L) 8.9 - 10.3 mg/dL   GFR, Estimated >62 >13 mL/min   Anion gap 9 5 - 15  CBC  Result Value Ref Range   WBC 5.6 4.0 -  10.5 K/uL   RBC 3.49 (L) 3.87 - 5.11 MIL/uL   Hemoglobin 12.2 12.0 - 15.0 g/dL   HCT 16.1 (L) 09.6 - 04.5 %   MCV 101.7 (H) 80.0 - 100.0 fL   MCH 35.0 (H) 26.0 - 34.0 pg   MCHC 34.4 30.0 - 36.0 g/dL   RDW 40.9 (H) 81.1 - 91.4 %   Platelets 303 150 - 400 K/uL   nRBC 0.0 0.0 - 0.2 %  Urinalysis, Routine w reflex microscopic -Urine, Clean Catch  Result Value Ref Range   Color, Urine STRAW (A) YELLOW   APPearance CLEAR CLEAR   Specific Gravity, Urine 1.010 1.005 - 1.030   pH 6.0 5.0 - 8.0   Glucose, UA NEGATIVE NEGATIVE mg/dL   Hgb urine dipstick NEGATIVE NEGATIVE   Bilirubin Urine NEGATIVE NEGATIVE   Ketones, ur NEGATIVE NEGATIVE mg/dL   Protein, ur NEGATIVE NEGATIVE mg/dL   Nitrite NEGATIVE NEGATIVE   Leukocytes,Ua NEGATIVE NEGATIVE  Brain natriuretic peptide  Result Value Ref Range   B Natriuretic Peptide 61.5 0.0 - 100.0 pg/mL  Rapid urine drug screen (hospital performed)  Result Value Ref Range   Opiates NONE DETECTED NONE DETECTED   Cocaine NONE DETECTED NONE DETECTED   Benzodiazepines NONE DETECTED NONE DETECTED   Amphetamines NONE DETECTED NONE DETECTED   Tetrahydrocannabinol NONE DETECTED NONE DETECTED   Barbiturates NONE DETECTED NONE DETECTED  CBG monitoring, ED  Result Value Ref Range   Glucose-Capillary 114 (H) 70 - 99 mg/dL   Comment 1 Notify RN   Troponin I (High Sensitivity)  Result Value Ref Range   Troponin I (High Sensitivity) 6 <18 ng/L  Troponin I (High Sensitivity)  Result Value Ref Range   Troponin I (High Sensitivity) 6 <18 ng/L   CT Angio Chest PE W  and/or Wo Contrast  Result Date: 08/17/2022 CLINICAL DATA:  Dyspnea, chronic, chest wall or pleura disease suspected, dizziness, leukemia EXAM: CT ANGIOGRAPHY CHEST WITH CONTRAST TECHNIQUE: Multidetector CT imaging of the chest was performed using the standard protocol during bolus administration of intravenous contrast. Multiplanar CT image reconstructions and MIPs were obtained to evaluate the vascular anatomy. RADIATION DOSE REDUCTION: This exam was performed according to the departmental dose-optimization program which includes automated exposure control, adjustment of the mA and/or kV according to patient size and/or use of iterative reconstruction technique. CONTRAST:  75mL OMNIPAQUE IOHEXOL 350 MG/ML SOLN COMPARISON:  75 cc Omnipaque 350 FINDINGS: Cardiovascular: There is adequate opacification of the pulmonary arterial tree. No intraluminal filling defect identified to suggest acute pulmonary embolism. The central pulmonary arteries are of normal caliber. No significant coronary artery calcification. Global cardiac size within normal limits. No pericardial effusion. Mild dilation of the proximal descending thoracic aorta measuring 3.5 cm in greatest dimension. Ascending aorta and distal descending aorta are of normal caliber. Mediastinum/Nodes: No enlarged mediastinal, hilar, or axillary lymph nodes. Thyroid gland, trachea, and esophagus demonstrate no significant findings. Lungs/Pleura: Lungs are clear. No pleural effusion or pneumothorax. Upper Abdomen: No acute abnormality. Musculoskeletal: No chest wall abnormality. No acute or significant osseous findings. Review of the MIP images confirms the above findings. IMPRESSION: 1. No pulmonary embolism. No acute intrathoracic pathology identified. 2. Mild dilation of the proximal descending thoracic aorta measuring 3.5 cm in greatest dimension. Recommend annual imaging followup by CTA or MRA. This recommendation follows 2010  ACCF/AHA/AATS/ACR/ASA/SCA/SCAI/SIR/STS/SVM Guidelines for the Diagnosis and Management of Patients with Thoracic Aortic Disease. Circulation.2010; 121: N829-F621. Aortic aneurysm NOS (ICD10-I71.9) Electronically Signed   By: Helyn Numbers  M.D.   On: 08/17/2022 01:18   CT Head Wo Contrast  Result Date: 08/16/2022 CLINICAL DATA:  Trauma EXAM: CT HEAD WITHOUT CONTRAST TECHNIQUE: Contiguous axial images were obtained from the base of the skull through the vertex without intravenous contrast. RADIATION DOSE REDUCTION: This exam was performed according to the departmental dose-optimization program which includes automated exposure control, adjustment of the mA and/or kV according to patient size and/or use of iterative reconstruction technique. COMPARISON:  10/23/2013 FINDINGS: Brain: There is no mass, hemorrhage or extra-axial collection. The size and configuration of the ventricles and extra-axial CSF spaces are normal. The brain parenchyma is normal, without acute or chronic infarction. Vascular: No abnormal hyperdensity of the major intracranial arteries or dural venous sinuses. No intracranial atherosclerosis. Skull: The visualized skull base, calvarium and extracranial soft tissues are normal. Sinuses/Orbits: No fluid levels or advanced mucosal thickening of the visualized paranasal sinuses. No mastoid or middle ear effusion. The orbits are normal. IMPRESSION: Normal head CT. Electronically Signed   By: Deatra Robinson M.D.   On: 08/16/2022 23:45   DG Chest Portable 1 View  Result Date: 08/16/2022 CLINICAL DATA:  Hypertension, dizziness EXAM: PORTABLE CHEST 1 VIEW COMPARISON:  12/20/2021 FINDINGS: Single frontal view of the chest demonstrates a stable enlarged cardiac silhouette. No airspace disease, effusion, or pneumothorax. No acute bony abnormality. IMPRESSION: 1. Stable chest, no acute process. Electronically Signed   By: Sharlet Salina M.D.   On: 08/16/2022 23:38    EKG  EKG  Interpretation  Date/Time:  Monday Aug 16 2022 23:04:13 EDT Ventricular Rate:  80 PR Interval:  129 QRS Duration: 92 QT Interval:  383 QTC Calculation: 442 R Axis:   -27 Text Interpretation: Sinus rhythm Left ventricular hypertrophy Confirmed by Farris Blash ([id number]) on 08/17/2022 3:28:36 AM       '   Radiology CT Angio Chest PE W and/or Wo Contrast  Result Date: 08/17/2022 CLINICAL DATA:  Dyspnea, chronic, chest wall or pleura disease suspected, dizziness, leukemia EXAM: CT ANGIOGRAPHY CHEST WITH CONTRAST TECHNIQUE: Multidetector CT imaging of the chest was performed using the standard protocol during bolus administration of intravenous contrast. Multiplanar CT image reconstructions and MIPs were obtained to evaluate the vascular anatomy. RADIATION DOSE REDUCTION: This exam was performed according to the departmental dose-optimization program which includes automated exposure control, adjustment of the mA and/or kV according to patient size and/or use of iterative reconstruction technique. CONTRAST:  75mL OMNIPAQUE IOHEXOL 350 MG/ML SOLN COMPARISON:  75 cc Omnipaque 350 FINDINGS: Cardiovascular: There is adequate opacification of the pulmonary arterial tree. No intraluminal filling defect identified to suggest acute pulmonary embolism. The central pulmonary arteries are of normal caliber. No significant coronary artery calcification. Global cardiac size within normal limits. No pericardial effusion. Mild dilation of the proximal descending thoracic aorta measuring 3.5 cm in greatest dimension. Ascending aorta and distal descending aorta are of normal caliber. Mediastinum/Nodes: No enlarged mediastinal, hilar, or axillary lymph nodes. Thyroid gland, trachea, and esophagus demonstrate no significant findings. Lungs/Pleura: Lungs are clear. No pleural effusion or pneumothorax. Upper Abdomen: No acute abnormality. Musculoskeletal: No chest wall abnormality. No acute or significant osseous findings.  Review of the MIP images confirms the above findings. IMPRESSION: 1. No pulmonary embolism. No acute intrathoracic pathology identified. 2. Mild dilation of the proximal descending thoracic aorta measuring 3.5 cm in greatest dimension. Recommend annual imaging followup by CTA or MRA. This recommendation follows 2010 ACCF/AHA/AATS/ACR/ASA/SCA/SCAI/SIR/STS/SVM Guidelines for the Diagnosis and Management of Patients with Thoracic Aortic Disease. Circulation.2010; 121: U045-W098.  Aortic aneurysm NOS (ICD10-I71.9) Electronically Signed   By: Helyn Numbers M.D.   On: 08/17/2022 01:18   CT Head Wo Contrast  Result Date: 08/16/2022 CLINICAL DATA:  Trauma EXAM: CT HEAD WITHOUT CONTRAST TECHNIQUE: Contiguous axial images were obtained from the base of the skull through the vertex without intravenous contrast. RADIATION DOSE REDUCTION: This exam was performed according to the departmental dose-optimization program which includes automated exposure control, adjustment of the mA and/or kV according to patient size and/or use of iterative reconstruction technique. COMPARISON:  10/23/2013 FINDINGS: Brain: There is no mass, hemorrhage or extra-axial collection. The size and configuration of the ventricles and extra-axial CSF spaces are normal. The brain parenchyma is normal, without acute or chronic infarction. Vascular: No abnormal hyperdensity of the major intracranial arteries or dural venous sinuses. No intracranial atherosclerosis. Skull: The visualized skull base, calvarium and extracranial soft tissues are normal. Sinuses/Orbits: No fluid levels or advanced mucosal thickening of the visualized paranasal sinuses. No mastoid or middle ear effusion. The orbits are normal. IMPRESSION: Normal head CT. Electronically Signed   By: Deatra Robinson M.D.   On: 08/16/2022 23:45   DG Chest Portable 1 View  Result Date: 08/16/2022 CLINICAL DATA:  Hypertension, dizziness EXAM: PORTABLE CHEST 1 VIEW COMPARISON:  12/20/2021 FINDINGS:  Single frontal view of the chest demonstrates a stable enlarged cardiac silhouette. No airspace disease, effusion, or pneumothorax. No acute bony abnormality. IMPRESSION: 1. Stable chest, no acute process. Electronically Signed   By: Sharlet Salina M.D.   On: 08/16/2022 23:38    Procedures Procedures    Medications Ordered in ED Medications  hydrALAZINE (APRESOLINE) injection 2 mg (2 mg Intravenous Given 08/16/22 2340)  alum & mag hydroxide-simeth (MAALOX/MYLANTA) 200-200-20 MG/5ML suspension 30 mL (30 mLs Oral Given 08/17/22 0056)  acetaminophen (TYLENOL) tablet 1,000 mg (1,000 mg Oral Given 08/17/22 0056)  iohexol (OMNIPAQUE) 350 MG/ML injection 75 mL (75 mLs Intravenous Contrast Given 08/17/22 0101)    ED Course/ Medical Decision Making/ A&P                             Medical Decision Making Syncope in the shower   Problems Addressed: Enlarged thoracic aorta [facility]):    Details: Follow up with vascular surgery Dr. Chestine Spore information placed on DC papers patient verbalizes understanding  Non compliance w medication regimen:    Details: Restart all medication for medical illness including hypertension  Primary hypertension: chronic illness or injury    Details: Hydralazine given in the ED patient states she has BP medication at home she is just not taking it.  Advised to restart this medication and follow up with PMD ASAP.   Amount and/or Complexity of Data Reviewed External Data Reviewed: notes.    Details: Previous notes and labs in epic reviewed  Labs: ordered.    Details: All labs reviewed: sodium 135 normal.  Potassium 3.8, normal creatinine .86 normal. UDS negative troponins normal 6/6. White count normal 6.6, normal hemoglobin 12.2 normal platelets  Radiology: ordered and independent interpretation performed.    Details: No PE by me on CTA CT head normal by me  ECG/medicine tests: ordered and independent interpretation performed. Decision-making details documented in ED  Course.  Risk OTC drugs. Prescription drug management. Risk Details: Well appearing BP is now improved patient is resting comfortably in bed without complaints at this time.  Ruled out for MI heart score is 2 low risk for MACE.  Ruled out for  PE.  Restart BP medication immediately.  Follow up with your PMD for ongoing care.  Stable for discharge.  Strict return     Final Clinical Impression(s) / ED Diagnoses Final diagnoses:  Syncope and collapse  Primary hypertension   Return for intractable cough, coughing up blood, fevers > 100.4 unrelieved by medication, shortness of breath, intractable vomiting, chest pain, shortness of breath, weakness, numbness, changes in speech, facial asymmetry, abdominal pain, passing out, Inability to tolerate liquids or food, cough, altered mental status or any concerns. No signs of systemic illness or infection. The patient is nontoxic-appearing on exam and vital signs are within normal limits.  I have reviewed the triage vital signs and the nursing notes. Pertinent labs & imaging results that were available during my care of the patient were reviewed by me and considered in my medical decision making (see chart for details). After history, exam, and medical workup I feel the patient has been appropriately medically screened and is safe for discharge home. Pertinent diagnoses were discussed with the patient. Patient was given return precautions.  Rx / DC Orders ED Discharge Orders     None         Johan Creveling, MD 08/17/22 [phone]

## 2022-08-17 NOTE — ED Notes (Signed)
BP has improved, pt continues to c/o CP EDP made aware

## 2023-04-19 ENCOUNTER — Other Ambulatory Visit: Payer: Self-pay

## 2023-04-19 ENCOUNTER — Emergency Department (HOSPITAL_BASED_OUTPATIENT_CLINIC_OR_DEPARTMENT_OTHER): Payer: MEDICAID

## 2023-04-19 ENCOUNTER — Emergency Department (HOSPITAL_BASED_OUTPATIENT_CLINIC_OR_DEPARTMENT_OTHER)
Admission: EM | Admit: 2023-04-19 | Discharge: 2023-04-19 | Disposition: A | Discharge status: Home / Self Care | Payer: MEDICAID | Attending: Emergency Medicine | Admitting: Emergency Medicine

## 2023-04-19 ENCOUNTER — Encounter (HOSPITAL_BASED_OUTPATIENT_CLINIC_OR_DEPARTMENT_OTHER): Payer: Self-pay | Admitting: Emergency Medicine

## 2023-04-19 DIAGNOSIS — J45909 Unspecified asthma, uncomplicated: Secondary | ICD-10-CM | POA: Insufficient documentation

## 2023-04-19 DIAGNOSIS — Z7951 Long term (current) use of inhaled steroids: Secondary | ICD-10-CM | POA: Insufficient documentation

## 2023-04-19 DIAGNOSIS — J111 Influenza due to unidentified influenza virus with other respiratory manifestations: Secondary | ICD-10-CM

## 2023-04-19 DIAGNOSIS — R059 Cough, unspecified: Secondary | ICD-10-CM | POA: Diagnosis present

## 2023-04-19 DIAGNOSIS — J09X2 Influenza due to identified novel influenza A virus with other respiratory manifestations: Secondary | ICD-10-CM | POA: Insufficient documentation

## 2023-04-19 DIAGNOSIS — Z20822 Contact with and (suspected) exposure to covid-19: Secondary | ICD-10-CM | POA: Diagnosis not present

## 2023-04-19 DIAGNOSIS — I509 Heart failure, unspecified: Secondary | ICD-10-CM | POA: Insufficient documentation

## 2023-04-19 LAB — RESP PANEL BY RT-PCR (RSV, FLU A&B, COVID)  RVPGX2
Influenza A by PCR: POSITIVE — AB
Influenza B by PCR: NEGATIVE
Resp Syncytial Virus by PCR: NEGATIVE
SARS Coronavirus 2 by RT PCR: NEGATIVE

## 2023-04-19 MED ORDER — ACETAMINOPHEN 325 MG PO TABS
650.0000 mg | ORAL_TABLET | Freq: Once | ORAL | Status: AC
  Administered 2023-04-19: 650 mg via ORAL
  Filled 2023-04-19: qty 2

## 2023-04-19 MED ORDER — KETOROLAC TROMETHAMINE 15 MG/ML IJ SOLN
15.0000 mg | Freq: Once | INTRAMUSCULAR | Status: AC
  Administered 2023-04-19: 15 mg via INTRAMUSCULAR
  Filled 2023-04-19: qty 1

## 2023-04-19 MED ORDER — OSELTAMIVIR PHOSPHATE 75 MG PO CAPS: 75.0000 mg | ORAL_CAPSULE | Freq: Two times a day (BID) | ORAL | 0 refills | Status: AC

## 2023-04-19 MED ORDER — PREDNISONE 20 MG PO TABS: 40.0000 mg | ORAL_TABLET | Freq: Every day | ORAL | 0 refills | Status: AC

## 2023-04-19 MED ORDER — IPRATROPIUM-ALBUTEROL 0.5-2.5 (3) MG/3ML IN SOLN
3.0000 mL | Freq: Once | RESPIRATORY_TRACT | Status: AC
  Administered 2023-04-19: 3 mL via RESPIRATORY_TRACT
  Filled 2023-04-19: qty 3

## 2023-04-19 MED ORDER — PREDNISONE 50 MG PO TABS
60.0000 mg | ORAL_TABLET | Freq: Once | ORAL | Status: AC
  Administered 2023-04-19: 60 mg via ORAL
  Filled 2023-04-19: qty 1

## 2023-04-19 NOTE — ED Provider Notes (Signed)
[facility] AT [facility] Provider Note   CSN: [id number] Arrival date & time: 04/19/23  1327     History  Chief Complaint  Patient presents with   Cough   Shortness of Breath    Valerie Haney is a 53 y.o. female with a history of CHF, stroke, and asthma who presents the ED today for cough.  Patient reports that she woke up around 2 AM this morning with cough, shortness of breath, chest pain, and congestion.  Patient reports she has tried taking "everything" for her symptoms without improvement.  Denies fevers, nausea, vomiting, diarrhea, or abdominal pain.  She had a breathing treatment prior to arrival without improvement of cough or shortness of breath. Additionally, she endorses chest tightness with cough.  No known sick contact.    Home Medications Prior to Admission medications   Medication Sig Start Date End Date Taking? Authorizing Provider  oseltamivir (TAMIFLU) 75 MG capsule Take 1 capsule (75 mg total) by mouth every 12 (twelve) hours for 5 days. 04/19/23 04/24/23 Yes Maxwell Marion, PA-C  predniSONE (DELTASONE) 20 MG tablet Take 2 tablets (40 mg total) by mouth daily for 4 days. 04/19/23 04/23/23 Yes Maxwell Marion, PA-C  albuterol (PROVENTIL) (2.5 MG/3ML) 0.083% nebulizer solution Take 2.5 mg by nebulization every 3 (three) hours as needed for wheezing or shortness of breath.    [provider]  albuterol (VENTOLIN HFA) 108 (90 Base) MCG/ACT inhaler Inhale 2 puffs into the lungs every 4 (four) hours as needed for wheezing or shortness of breath.    [provider]  ALPRAZolam Prudy Feeler) 1 MG tablet Take 1 mg by mouth 2 (two) times daily. 11/04/20   [provider]  atorvastatin (LIPITOR) 40 MG tablet Take 40 mg by mouth daily. Patient not taking: Reported on 10/26/2021 05/31/16   [provider]  azithromycin (ZITHROMAX) 250 MG tablet Take 1 tablet (250 mg total) by mouth daily. 10/29/21   Almon Hercules, MD   budesonide-formoterol (SYMBICORT) 160-4.5 MCG/ACT inhaler Inhale 2 puffs into the lungs in the morning and at bedtime. 10/29/21   Almon Hercules, MD  methocarbamol (ROBAXIN) 500 MG tablet Take 1 tablet (500 mg total) by mouth 2 (two) times daily. 08/11/22   Darrick Grinder, PA-C  naproxen (NAPROSYN) 500 MG tablet Take 1 tablet (500 mg total) by mouth 2 (two) times daily. 08/11/22   Darrick Grinder, PA-C  OXcarbazepine ER 600 MG TB24 Take 600 mg by mouth daily. Patient not taking: Reported on 10/26/2021 08/13/21 11/26/21  Windell Norfolk, MD  oxyCODONE-acetaminophen (PERCOCET) 10-325 MG tablet Take 1 tablet by mouth in the morning, at noon, in the evening, and at bedtime.    [provider]  pantoprazole (PROTONIX) 20 MG tablet Take 1 tablet (20 mg total) by mouth daily. 12/20/21   Arby Barrette, MD  phentermine (ADIPEX-P) 37.5 MG tablet Take 37.5 mg by mouth daily before breakfast. Patient not taking: Reported on 10/26/2021    [provider]  propranolol ER (INDERAL LA) 60 MG 24 hr capsule Take 1 capsule (60 mg total) by mouth daily. 11/26/21 06/24/22  Windell Norfolk, MD      Allergies    Lisinopril    Review of Systems   Review of Systems  Respiratory:  Positive for cough and shortness of breath.   All other systems reviewed and are negative.   Physical Exam Updated Vital Signs BP (!) 144/87   Pulse 72   Temp 98.4 F (36.9  C)   Resp 20   Ht 5\' 5"  (1.651 m)   Wt 84.8 kg   SpO2 99%   BMI 31.12 kg/m  Physical Exam Vitals and nursing note reviewed.  Constitutional:      Appearance: Normal appearance. She is ill-appearing.  HENT:     Head: Normocephalic and atraumatic.     Mouth/Throat:     Mouth: Mucous membranes are moist.     Pharynx: Oropharynx is clear.  Eyes:     Conjunctiva/sclera: Conjunctivae normal.     Pupils: Pupils are equal, round, and reactive to light.  Cardiovascular:     Rate and Rhythm: Normal rate and regular rhythm.     Pulses: Normal  pulses.     Heart sounds: Normal heart sounds.  Pulmonary:     Effort: Pulmonary effort is normal.     Breath sounds: Wheezing present.     Comments: Wheezing improved after DuoNeb treatment. Abdominal:     Palpations: Abdomen is soft.     Tenderness: There is no abdominal tenderness.  Musculoskeletal:     Cervical back: Normal range of motion. No tenderness.  Lymphadenopathy:     Cervical: No cervical adenopathy.  Skin:    General: Skin is warm and dry.     Findings: No rash.  Neurological:     General: No focal deficit present.     Mental Status: She is alert.  Psychiatric:        Mood and Affect: Mood normal.        Behavior: Behavior normal.    ED Results / Procedures / Treatments   Labs (all labs ordered are listed, but only abnormal results are displayed) Labs Reviewed  RESP PANEL BY RT-PCR (RSV, FLU A&B, COVID)  [id number] - Abnormal; Notable for the following components:      Result Value   Influenza A by PCR POSITIVE (*)    All other components within normal limits    EKG None  Radiology DG Chest 2 View Result Date: 04/19/2023 CLINICAL DATA:  Cough.  Body aches.  Chills. EXAM: CHEST - 2 VIEW COMPARISON:  08/16/2022 FINDINGS: Left ventricular prominence. Mildly tortuous aorta. The lungs are clear. The vascularity is normal. No effusions. No abnormal bone finding. IMPRESSION: No active disease. Left ventricular prominence. Mildly tortuous aorta. Electronically Signed   By: Paulina Fusi M.D.   On: 04/19/2023 15:36    Procedures Procedures: not indicated.   Medications Ordered in ED Medications  ipratropium-albuterol (DUONEB) 0.5-2.5 (3) MG/3ML nebulizer solution 3 mL (3 mLs Nebulization Given 04/19/23 1459)  acetaminophen (TYLENOL) tablet 650 mg (650 mg Oral Given 04/19/23 1525)  predniSONE (DELTASONE) tablet 60 mg (60 mg Oral Given 04/19/23 1630)  ketorolac (TORADOL) 15 MG/ML injection 15 mg (15 mg Intramuscular Given 04/19/23 1630)    ED Course/ Medical Decision  Making/ A&P                                 Medical Decision Making Amount and/or Complexity of Data Reviewed Radiology: ordered.  Risk OTC drugs. Prescription drug management.   This patient presents to the ED for concern of shortness of breath and cough, this involves an extensive number of treatment options, and is a complaint that carries with it a high risk of complications and morbidity.   Differential diagnosis includes: flu, COVID, RSV, other viral illness, asthma exacerbation, etc.    Comorbidities  See HPI above  Additional History  Additional history obtained from prior records.   Lab Tests  I ordered and personally interpreted labs.  The pertinent results include:   Respiratory panel positive for flu A   Imaging Studies  I ordered imaging studies including CXR  I independently visualized and interpreted imaging which showed: No active cardiopulmonary disease. I agree with the radiologist interpretation   Problem List / ED Course / Critical Interventions / Medication Management  Shortness of breath, cough, and body aches since this morning.  Patient reports a history of asthma and used a DuoNeb treatment prior to arrival without improvement of her shortness of breath. I ordered medications including: DuoNeb for wheezing Tylenol for body aches  Reevaluation of the patient after these medicines showed that the patient improved.  On auscultation the wheezing have subsided.  Patient states that her body aches persisted.  Toradol was ordered prior to discharge as well as prednisone to treat for asthma exacerbation. I have reviewed the patients home medicines and have made adjustments as needed   Social Determinants of Health  Access to healthcare   Test / Admission - Considered  Discussed findings with patient.  All questions were answered. She is stable and safe discharge home. Return precautions provided.       Final Clinical Impression(s) /  ED Diagnoses Final diagnoses:  Flu    Rx / DC Orders ED Discharge Orders          Ordered    oseltamivir (TAMIFLU) 75 MG capsule  Every 12 hours        04/19/23 1610    predniSONE (DELTASONE) 20 MG tablet  Daily        04/19/23 1610              Maxwell Marion, PA-C 04/19/23 2332    Virgina Norfolk, DO 04/20/23 [phone]

## 2023-04-19 NOTE — ED Triage Notes (Signed)
Pt reports body aches, chills, cough, congestion; denies fever, n/v/d; pt has hx of asthma and did a breathing tx this AM prior to coming to ED

## 2023-04-19 NOTE — ED Notes (Signed)
Reviewed discharge instructions, medications and follow up with pt. Pt states understanding Ambulatory at time of discharge

## 2023-04-19 NOTE — ED Notes (Signed)
Pt transported to imaging.

## 2023-04-19 NOTE — Discharge Instructions (Addendum)
As discussed, you are positive for flu A.  Take Tamiflu twice a day for 5 days.  Additionally, take prednisone 40 mg for the next 4 days for your asthma exacerbation. Make sure you're drinking plenty of fluids. Alternate between ibuprofen and tylenol every 4 hours as needed for body aches. Take Robitussin OTC as needed for cough.  Follow-up with your primary care provider in 1 week for reevaluation.  Get help right away if: You become short of breath or have trouble breathing. Your skin or nails turn blue. You have very bad pain or stiffness in your neck. You get a sudden headache or pain in your face or ear. You vomit each time you eat or drink.

## 2023-04-25 ENCOUNTER — Emergency Department (HOSPITAL_BASED_OUTPATIENT_CLINIC_OR_DEPARTMENT_OTHER): Payer: MEDICAID

## 2023-04-25 ENCOUNTER — Other Ambulatory Visit: Payer: Self-pay

## 2023-04-25 DIAGNOSIS — Z7951 Long term (current) use of inhaled steroids: Secondary | ICD-10-CM

## 2023-04-25 DIAGNOSIS — E785 Hyperlipidemia, unspecified: Secondary | ICD-10-CM | POA: Diagnosis present

## 2023-04-25 DIAGNOSIS — J4550 Severe persistent asthma, uncomplicated: Secondary | ICD-10-CM | POA: Diagnosis present

## 2023-04-25 DIAGNOSIS — J1001 Influenza due to other identified influenza virus with the same other identified influenza virus pneumonia: Principal | ICD-10-CM | POA: Diagnosis present

## 2023-04-25 DIAGNOSIS — Z8673 Personal history of transient ischemic attack (TIA), and cerebral infarction without residual deficits: Secondary | ICD-10-CM

## 2023-04-25 DIAGNOSIS — Z1152 Encounter for screening for COVID-19: Secondary | ICD-10-CM

## 2023-04-25 DIAGNOSIS — F411 Generalized anxiety disorder: Secondary | ICD-10-CM | POA: Diagnosis present

## 2023-04-25 DIAGNOSIS — Z90710 Acquired absence of both cervix and uterus: Secondary | ICD-10-CM

## 2023-04-25 DIAGNOSIS — K219 Gastro-esophageal reflux disease without esophagitis: Secondary | ICD-10-CM | POA: Diagnosis present

## 2023-04-25 DIAGNOSIS — E66811 Obesity, class 1: Secondary | ICD-10-CM | POA: Diagnosis present

## 2023-04-25 DIAGNOSIS — Z91148 Patient's other noncompliance with medication regimen for other reason: Secondary | ICD-10-CM

## 2023-04-25 DIAGNOSIS — Z79899 Other long term (current) drug therapy: Secondary | ICD-10-CM

## 2023-04-25 DIAGNOSIS — I5032 Chronic diastolic (congestive) heart failure: Secondary | ICD-10-CM | POA: Diagnosis present

## 2023-04-25 DIAGNOSIS — G40909 Epilepsy, unspecified, not intractable, without status epilepticus: Secondary | ICD-10-CM | POA: Diagnosis present

## 2023-04-25 DIAGNOSIS — Z888 Allergy status to other drugs, medicaments and biological substances status: Secondary | ICD-10-CM

## 2023-04-25 DIAGNOSIS — Z6831 Body mass index (BMI) 31.0-31.9, adult: Secondary | ICD-10-CM

## 2023-04-25 DIAGNOSIS — I110 Hypertensive heart disease with heart failure: Secondary | ICD-10-CM | POA: Diagnosis present

## 2023-04-25 DIAGNOSIS — J101 Influenza due to other identified influenza virus with other respiratory manifestations: Secondary | ICD-10-CM | POA: Diagnosis present

## 2023-04-25 MED ORDER — IPRATROPIUM-ALBUTEROL 0.5-2.5 (3) MG/3ML IN SOLN
3.0000 mL | Freq: Once | RESPIRATORY_TRACT | Status: AC
  Administered 2023-04-25: 3 mL via RESPIRATORY_TRACT

## 2023-04-25 MED ORDER — IPRATROPIUM-ALBUTEROL 0.5-2.5 (3) MG/3ML IN SOLN
RESPIRATORY_TRACT | Status: AC
  Filled 2023-04-25: qty 3

## 2023-04-25 MED ORDER — ALBUTEROL SULFATE (2.5 MG/3ML) 0.083% IN NEBU
2.5000 mg | INHALATION_SOLUTION | Freq: Once | RESPIRATORY_TRACT | Status: AC
  Administered 2023-04-25: 2.5 mg via RESPIRATORY_TRACT

## 2023-04-25 MED ORDER — ALBUTEROL SULFATE (2.5 MG/3ML) 0.083% IN NEBU
INHALATION_SOLUTION | RESPIRATORY_TRACT | Status: AC
  Filled 2023-04-25: qty 3

## 2023-04-25 NOTE — ED Triage Notes (Signed)
Per Pt, she was diagnosed last week w/ the flu and has continues to have SOB despite taking home breathing treatments.  She is experiencing chest pain as well.

## 2023-04-25 NOTE — ED Notes (Signed)
Breathing treatment complete

## 2023-04-26 ENCOUNTER — Encounter (HOSPITAL_COMMUNITY): Payer: Self-pay

## 2023-04-26 ENCOUNTER — Inpatient Hospital Stay (HOSPITAL_BASED_OUTPATIENT_CLINIC_OR_DEPARTMENT_OTHER)
Admission: EM | Admit: 2023-04-26 | Discharge: 2023-04-28 | DRG: 194 | Disposition: A | Discharge status: Home / Self Care | Payer: MEDICAID | Attending: Internal Medicine | Admitting: Internal Medicine

## 2023-04-26 DIAGNOSIS — R0602 Shortness of breath: Secondary | ICD-10-CM | POA: Diagnosis present

## 2023-04-26 DIAGNOSIS — J069 Acute upper respiratory infection, unspecified: Secondary | ICD-10-CM

## 2023-04-26 DIAGNOSIS — J45901 Unspecified asthma with (acute) exacerbation: Secondary | ICD-10-CM

## 2023-04-26 DIAGNOSIS — E785 Hyperlipidemia, unspecified: Secondary | ICD-10-CM | POA: Diagnosis present

## 2023-04-26 DIAGNOSIS — K219 Gastro-esophageal reflux disease without esophagitis: Secondary | ICD-10-CM | POA: Diagnosis present

## 2023-04-26 DIAGNOSIS — G40909 Epilepsy, unspecified, not intractable, without status epilepticus: Secondary | ICD-10-CM

## 2023-04-26 DIAGNOSIS — F411 Generalized anxiety disorder: Secondary | ICD-10-CM | POA: Diagnosis present

## 2023-04-26 DIAGNOSIS — J1100 Influenza due to unidentified influenza virus with unspecified type of pneumonia: Secondary | ICD-10-CM | POA: Diagnosis present

## 2023-04-26 DIAGNOSIS — J4551 Severe persistent asthma with (acute) exacerbation: Principal | ICD-10-CM

## 2023-04-26 DIAGNOSIS — I10 Essential (primary) hypertension: Secondary | ICD-10-CM | POA: Diagnosis present

## 2023-04-26 DIAGNOSIS — J101 Influenza due to other identified influenza virus with other respiratory manifestations: Secondary | ICD-10-CM | POA: Diagnosis present

## 2023-04-26 LAB — BASIC METABOLIC PANEL
Anion gap: 11 (ref 5–15)
BUN: 11 mg/dL (ref 6–20)
CO2: 24 mmol/L (ref 22–32)
Calcium: 9 mg/dL (ref 8.9–10.3)
Chloride: 100 mmol/L (ref 98–111)
Creatinine, Ser: 0.75 mg/dL (ref 0.44–1.00)
GFR, Estimated: >60 mL/min (ref >60)
Glucose, Bld: 107 mg/dL — ABNORMAL HIGH (ref 70–99)
Potassium: 2.9 mmol/L — ABNORMAL LOW (ref 3.5–5.1)
Sodium: 135 mmol/L (ref 135–145)

## 2023-04-26 LAB — CBC
HCT: 38.2 % (ref 36.0–46.0)
Hemoglobin: 13.2 g/dL (ref 12.0–15.0)
MCH: 35.3 pg — ABNORMAL HIGH (ref 26.0–34.0)
MCHC: 34.6 g/dL (ref 30.0–36.0)
MCV: 102.1 fL — ABNORMAL HIGH (ref 80.0–100.0)
Platelets: 349 10*3/uL (ref 150–400)
RBC: 3.74 MIL/uL — ABNORMAL LOW (ref 3.87–5.11)
RDW: 14.1 % (ref 11.5–15.5)
WBC: 7.1 10*3/uL (ref 4.0–10.5)
nRBC: 0 % (ref 0.0–0.2)

## 2023-04-26 LAB — TROPONIN I (HIGH SENSITIVITY)
Troponin I (High Sensitivity): 7 ng/L (ref <18)
Troponin I (High Sensitivity): 5 ng/L (ref <18)

## 2023-04-26 LAB — HEPATIC FUNCTION PANEL
ALT: 16 U/L (ref 0–44)
AST: 19 U/L (ref 15–41)
Albumin: 3 g/dL — ABNORMAL LOW (ref 3.5–5.0)
Alkaline Phosphatase: 39 U/L (ref 38–126)
Bilirubin, Direct: 0.1 mg/dL (ref 0.0–0.2)
Indirect Bilirubin: 0.6 mg/dL (ref 0.3–0.9)
Total Bilirubin: 0.7 mg/dL (ref 0.0–1.2)
Total Protein: 6 g/dL — ABNORMAL LOW (ref 6.5–8.1)

## 2023-04-26 LAB — PHOSPHORUS: Phosphorus: 2.7 mg/dL (ref 2.5–4.6)

## 2023-04-26 LAB — D-DIMER, QUANTITATIVE: D-Dimer, Quant: 0.97 ug{FEU}/mL — ABNORMAL HIGH (ref 0.00–0.50)

## 2023-04-26 LAB — VITAMIN B12: Vitamin B-12: 768 pg/mL (ref 180–914)

## 2023-04-26 LAB — BRAIN NATRIURETIC PEPTIDE: B Natriuretic Peptide: 30.6 pg/mL (ref 0.0–100.0)

## 2023-04-26 MED ORDER — METHYLPREDNISOLONE SODIUM SUCC 40 MG IJ SOLR
40.0000 mg | Freq: Two times a day (BID) | INTRAMUSCULAR | Status: DC
  Administered 2023-04-27 – 2023-04-28 (×3): 40 mg via INTRAVENOUS
  Filled 2023-04-26 (×3): qty 1

## 2023-04-26 MED ORDER — ALPRAZOLAM 0.5 MG PO TABS
1.0000 mg | ORAL_TABLET | Freq: Every day | ORAL | Status: AC | PRN
  Administered 2023-04-26: 1 mg via ORAL
  Filled 2023-04-26: qty 2

## 2023-04-26 MED ORDER — ALBUTEROL SULFATE (2.5 MG/3ML) 0.083% IN NEBU: 2.5000 mg | INHALATION_SOLUTION | RESPIRATORY_TRACT | Status: DC | PRN

## 2023-04-26 MED ORDER — MAGNESIUM SULFATE 2 GM/50ML IV SOLN
2.0000 g | Freq: Once | INTRAVENOUS | Status: AC
  Administered 2023-04-26: 2 g via INTRAVENOUS
  Filled 2023-04-26: qty 50

## 2023-04-26 MED ORDER — POTASSIUM CHLORIDE CRYS ER 20 MEQ PO TBCR
40.0000 meq | EXTENDED_RELEASE_TABLET | Freq: Once | ORAL | Status: AC
  Administered 2023-04-26: 40 meq via ORAL
  Filled 2023-04-26: qty 2

## 2023-04-26 MED ORDER — AMLODIPINE BESYLATE 5 MG PO TABS
5.0000 mg | ORAL_TABLET | Freq: Every day | ORAL | Status: DC
  Administered 2023-04-27 – 2023-04-28 (×2): 5 mg via ORAL
  Filled 2023-04-26 (×2): qty 1

## 2023-04-26 MED ORDER — AMLODIPINE BESYLATE 5 MG PO TABS
5.0000 mg | ORAL_TABLET | Freq: Once | ORAL | Status: AC
  Administered 2023-04-26: 5 mg via ORAL
  Filled 2023-04-26: qty 1

## 2023-04-26 MED ORDER — ALBUTEROL SULFATE (2.5 MG/3ML) 0.083% IN NEBU: 5.0000 mg | INHALATION_SOLUTION | Freq: Once | RESPIRATORY_TRACT | Status: AC

## 2023-04-26 MED ORDER — ACETAMINOPHEN 325 MG PO TABS
650.0000 mg | ORAL_TABLET | Freq: Four times a day (QID) | ORAL | Status: DC | PRN
Start: 2023-04-26 — End: 2023-04-28
  Administered 2023-04-27: 650 mg via ORAL
  Filled 2023-04-26: qty 2

## 2023-04-26 MED ORDER — HEPARIN SODIUM (PORCINE) 5000 UNIT/ML IJ SOLN
5000.0000 [IU] | Freq: Two times a day (BID) | INTRAMUSCULAR | Status: DC
  Administered 2023-04-26 – 2023-04-28 (×4): 5000 [IU] via SUBCUTANEOUS
  Filled 2023-04-26 (×4): qty 1

## 2023-04-26 MED ORDER — OXCARBAZEPINE ER 600 MG PO TB24
600.0000 mg | ORAL_TABLET | Freq: Every day | ORAL | Status: DC
Start: 2023-04-27 — End: 2023-04-26

## 2023-04-26 MED ORDER — ACETAMINOPHEN 650 MG RE SUPP: 650.0000 mg | Freq: Four times a day (QID) | RECTAL | Status: DC | PRN

## 2023-04-26 MED ORDER — METHYLPREDNISOLONE SODIUM SUCC 125 MG IJ SOLR
125.0000 mg | Freq: Once | INTRAMUSCULAR | Status: AC
  Administered 2023-04-26: 125 mg via INTRAVENOUS
  Filled 2023-04-26: qty 2

## 2023-04-26 MED ORDER — BENZONATATE 100 MG PO CAPS
100.0000 mg | ORAL_CAPSULE | Freq: Three times a day (TID) | ORAL | Status: DC
  Administered 2023-04-27 – 2023-04-28 (×4): 100 mg via ORAL
  Filled 2023-04-26 (×5): qty 1

## 2023-04-26 MED ORDER — ALPRAZOLAM 0.5 MG PO TABS
1.0000 mg | ORAL_TABLET | Freq: Every day | ORAL | Status: AC | PRN
  Administered 2023-04-27: 1 mg via ORAL
  Filled 2023-04-26: qty 2

## 2023-04-26 MED ORDER — PANTOPRAZOLE SODIUM 40 MG IV SOLR
40.0000 mg | Freq: Every day | INTRAVENOUS | Status: DC
  Administered 2023-04-26: 40 mg via INTRAVENOUS
  Filled 2023-04-26: qty 10

## 2023-04-26 MED ORDER — MORPHINE SULFATE (PF) 2 MG/ML IV SOLN
2.0000 mg | INTRAVENOUS | Status: AC | PRN
  Filled 2023-04-26: qty 1

## 2023-04-26 MED ORDER — IPRATROPIUM-ALBUTEROL 0.5-2.5 (3) MG/3ML IN SOLN
3.0000 mL | Freq: Four times a day (QID) | RESPIRATORY_TRACT | Status: DC
  Administered 2023-04-26 – 2023-04-28 (×9): 3 mL via RESPIRATORY_TRACT
  Filled 2023-04-26 (×9): qty 3

## 2023-04-26 MED ORDER — ALBUTEROL SULFATE (2.5 MG/3ML) 0.083% IN NEBU
INHALATION_SOLUTION | RESPIRATORY_TRACT | Status: AC
  Administered 2023-04-26: 5 mg via RESPIRATORY_TRACT
  Filled 2023-04-26: qty 6

## 2023-04-26 MED ORDER — SODIUM CHLORIDE 0.9% FLUSH
3.0000 mL | Freq: Two times a day (BID) | INTRAVENOUS | Status: DC
  Administered 2023-04-26 – 2023-04-27 (×2): 3 mL via INTRAVENOUS
  Administered 2023-04-27: 10 mL via INTRAVENOUS
  Administered 2023-04-28: 3 mL via INTRAVENOUS

## 2023-04-26 MED ORDER — ALBUTEROL SULFATE (2.5 MG/3ML) 0.083% IN NEBU
10.0000 mg | INHALATION_SOLUTION | Freq: Once | RESPIRATORY_TRACT | Status: AC
  Administered 2023-04-26: 10 mg via RESPIRATORY_TRACT
  Filled 2023-04-26: qty 12

## 2023-04-26 MED ORDER — BENZONATATE 100 MG PO CAPS
200.0000 mg | ORAL_CAPSULE | Freq: Once | ORAL | Status: AC
  Administered 2023-04-26: 200 mg via ORAL
  Filled 2023-04-26: qty 2

## 2023-04-26 MED ORDER — FLUTICASONE FUROATE-VILANTEROL 200-25 MCG/ACT IN AEPB
1.0000 | INHALATION_SPRAY | Freq: Every day | RESPIRATORY_TRACT | Status: DC
  Administered 2023-04-28: 1 via RESPIRATORY_TRACT
  Filled 2023-04-26 (×2): qty 28

## 2023-04-26 MED ORDER — SODIUM CHLORIDE 0.9 % IV SOLN
500.0000 mg | INTRAVENOUS | Status: DC
  Administered 2023-04-27: 500 mg via INTRAVENOUS
  Filled 2023-04-26: qty 5

## 2023-04-26 MED ORDER — IPRATROPIUM-ALBUTEROL 0.5-2.5 (3) MG/3ML IN SOLN
3.0000 mL | Freq: Once | RESPIRATORY_TRACT | Status: AC
  Administered 2023-04-26: 3 mL via RESPIRATORY_TRACT
  Filled 2023-04-26: qty 3

## 2023-04-26 MED ORDER — HYDRALAZINE HCL 20 MG/ML IJ SOLN
10.0000 mg | Freq: Four times a day (QID) | INTRAMUSCULAR | Status: DC | PRN
  Administered 2023-04-27: 10 mg via INTRAVENOUS
  Filled 2023-04-26: qty 1

## 2023-04-26 MED ORDER — HYDRALAZINE HCL 20 MG/ML IJ SOLN: 10.0000 mg | Freq: Four times a day (QID) | INTRAMUSCULAR | Status: DC | PRN

## 2023-04-26 NOTE — Assessment & Plan Note (Addendum)
Patient given single dose of alprazolam med rec is pending.

## 2023-04-26 NOTE — Assessment & Plan Note (Addendum)
Aspiration precaution, IV PPI.

## 2023-04-26 NOTE — ED Notes (Signed)
Pt got up and walked to the bathroom without notifying staff, showing increased VS after ambulation

## 2023-04-26 NOTE — ED Provider Notes (Signed)
[place] [facility] AT [facility] Provider Note   CSN: [id number] Arrival date & time: 04/25/23  2244     History  Chief Complaint  Patient presents with   Shortness of Breath   Chest Pain    Valerie Haney is a 53 y.o. female.   Shortness of Breath Associated symptoms: chest pain   Chest Pain Associated symptoms: shortness of breath      Patient has a history of asthma, CHF, hypertension, stroke, seizure.  Patient presents ED with complaints of shortness of breath.  Patient states she initially started having symptoms on the 21st.  She was diagnosed with influenza.  Patient states since that time she has had persistent trouble with her breathing.  She has been wheezing.  She has been feeling short of breath.  She has been trying her inhalers at home but it is not helping.  Home Medications Prior to Admission medications   Medication Sig Start Date End Date Taking? Authorizing Provider  albuterol (PROVENTIL) (2.5 MG/3ML) 0.083% nebulizer solution Take 2.5 mg by nebulization every 3 (three) hours as needed for wheezing or shortness of breath.   Yes [provider]  albuterol (VENTOLIN HFA) 108 (90 Base) MCG/ACT inhaler Inhale 2 puffs into the lungs every 4 (four) hours as needed for wheezing or shortness of breath.   Yes [provider]  ALPRAZolam Prudy Feeler) 1 MG tablet Take 1 mg by mouth daily as needed for anxiety. 11/04/20  Yes [provider]  budesonide-formoterol (SYMBICORT) 160-4.5 MCG/ACT inhaler Inhale 2 puffs into the lungs in the morning and at bedtime. 10/29/21  Yes Almon Hercules, MD  phentermine (ADIPEX-P) 37.5 MG tablet Take 37.5 mg by mouth daily before breakfast.   Yes [provider]  Ubrogepant (UBRELVY) 100 MG TABS Take 100 mg by mouth as needed (migraine). 10/07/20  Yes [provider]  atorvastatin (LIPITOR) 40 MG tablet Take 40 mg by mouth daily. Patient not taking: Reported on 10/26/2021 05/31/16    [provider]  OXcarbazepine ER 600 MG TB24 Take 600 mg by mouth daily. 08/13/21 04/26/23  Windell Norfolk, MD  oxyCODONE-acetaminophen (PERCOCET) 10-325 MG tablet Take 1 tablet by mouth in the morning, at noon, in the evening, and at bedtime.    [provider]  pantoprazole (PROTONIX) 20 MG tablet Take 1 tablet (20 mg total) by mouth daily. 12/20/21   Arby Barrette, MD  propranolol ER (INDERAL LA) 60 MG 24 hr capsule Take 1 capsule (60 mg total) by mouth daily. 11/26/21 04/26/23  Windell Norfolk, MD      Allergies    Lisinopril    Review of Systems   Review of Systems  Respiratory:  Positive for shortness of breath.   Cardiovascular:  Positive for chest pain.    Physical Exam Updated Vital Signs BP (!) 185/116   Pulse (!) 107   Temp 97.6 F (36.4 C) (Oral)   Resp (!) 25   Ht 1.651 m (5\' 5" )   Wt 84.8 kg   SpO2 100%   BMI 31.11 kg/m  Physical Exam Vitals and nursing note reviewed.  Constitutional:      General: She is not in acute distress.    Appearance: She is well-developed.  HENT:     Head: Normocephalic and atraumatic.     Right Ear: External ear normal.     Left Ear: External ear normal.  Eyes:     General: No scleral icterus.       Right  eye: No discharge.        Left eye: No discharge.     Conjunctiva/sclera: Conjunctivae normal.  Neck:     Trachea: No tracheal deviation.  Cardiovascular:     Rate and Rhythm: Normal rate and regular rhythm.  Pulmonary:     Effort: Tachypnea and accessory muscle usage present. No respiratory distress.     Breath sounds: Normal breath sounds. No stridor. No rales.  Abdominal:     General: Bowel sounds are normal. There is no distension.     Palpations: Abdomen is soft.     Tenderness: There is no abdominal tenderness. There is no guarding or rebound.  Musculoskeletal:        General: No tenderness or deformity.     Cervical back: Neck supple.     Right lower leg: No edema.     Left lower leg: No edema.   Skin:    General: Skin is warm and dry.     Findings: No rash.  Neurological:     General: No focal deficit present.     Mental Status: She is alert.     Cranial Nerves: No cranial nerve deficit, dysarthria or facial asymmetry.     Sensory: No sensory deficit.     Motor: No abnormal muscle tone or seizure activity.     Coordination: Coordination normal.  Psychiatric:        Mood and Affect: Mood normal.     ED Results / Procedures / Treatments   Labs (all labs ordered are listed, but only abnormal results are displayed) Labs Reviewed  BASIC METABOLIC PANEL - Abnormal; Notable for the following components:      Result Value   Potassium 2.9 (*)    Glucose, Bld 107 (*)    All other components within normal limits  CBC - Abnormal; Notable for the following components:   RBC 3.74 (*)    MCV 102.1 (*)    MCH 35.3 (*)    All other components within normal limits  BRAIN NATRIURETIC PEPTIDE  TROPONIN I (HIGH SENSITIVITY)  TROPONIN I (HIGH SENSITIVITY)    EKG EKG Interpretation Date/Time:  Monday April 25 2023 23:02:19 EST Ventricular Rate:  83 PR Interval:  119 QRS Duration:  83 QT Interval:  378 QTC Calculation: 445 R Axis:   -25  Text Interpretation: Sinus rhythm Ventricular premature complex Borderline short PR interval Probable left atrial enlargement Abnormal R-wave progression, late transition Left ventricular hypertrophy Nonspecific T abnormalities, diffuse leads No significant change since last tracing Confirmed by Linwood Dibbles [phone]) on 04/26/2023 8:09:09 AM  Radiology DG Chest 2 View Result Date: 04/25/2023 CLINICAL DATA:  Shortness of breath. Persistent shortness of breath after recent flu diagnosis. EXAM: CHEST - 2 VIEW COMPARISON:  04/19/2023 FINDINGS: The cardiomediastinal contours are stable. Unchanged aortic tortuosity. Pulmonary vasculature is normal. No consolidation, pleural effusion, or pneumothorax. No acute osseous abnormalities are seen. IMPRESSION:  Stable radiographic appearance of the chest.  No acute findings. Electronically Signed   By: Narda Rutherford M.D.   On: 04/25/2023 23:44    Procedures .Critical Care  Performed by: Linwood Dibbles, MD Authorized by: Linwood Dibbles, MD   Critical care provider statement:    Critical care time (minutes):  30   Critical care was time spent personally by me on the following activities:  Development of treatment plan with patient or surrogate, discussions with consultants, evaluation of patient's response to treatment, examination of patient, ordering and review of laboratory studies, ordering and  review of radiographic studies, ordering and performing treatments and interventions, pulse oximetry, re-evaluation of patient's condition and review of old charts     Medications Ordered in ED Medications  ipratropium-albuterol (DUONEB) 0.5-2.5 (3) MG/3ML nebulizer solution 3 mL (3 mLs Nebulization Given 04/26/23 1253)  ipratropium-albuterol (DUONEB) 0.5-2.5 (3) MG/3ML nebulizer solution 3 mL (3 mLs Nebulization Given 04/25/23 2302)  albuterol (PROVENTIL) (2.5 MG/3ML) 0.083% nebulizer solution 2.5 mg (2.5 mg Nebulization Given 04/25/23 2302)  albuterol (PROVENTIL) (2.5 MG/3ML) 0.083% nebulizer solution 5 mg (5 mg Nebulization Given 04/26/23 0419)  ipratropium-albuterol (DUONEB) 0.5-2.5 (3) MG/3ML nebulizer solution 3 mL (3 mLs Nebulization Given 04/26/23 0824)  benzonatate (TESSALON) capsule 200 mg (200 mg Oral Given 04/26/23 0833)  albuterol (PROVENTIL) (2.5 MG/3ML) 0.083% nebulizer solution 10 mg (10 mg Nebulization Given 04/26/23 1006)  magnesium sulfate IVPB 2 g 50 mL (0 g Intravenous Stopped 04/26/23 1055)  methylPREDNISolone sodium succinate (SOLU-MEDROL) 125 mg/2 mL injection 125 mg (125 mg Intravenous Given 04/26/23 0951)  amLODipine (NORVASC) tablet 5 mg (5 mg Oral Given 04/26/23 0952)  potassium chloride SA (KLOR-CON M) CR tablet 40 mEq (40 mEq Oral Given 04/26/23 1021)    ED Course/ Medical Decision  Making/ A&P Clinical Course as of 04/26/23 1539  Tue Apr 26, 2023  0928 Metabolic panel shows potassium decreased at 2.9.  CBC normal [JK]  0928 Chest x-ray does not show any acute findings [JK]  1116 Patient continues to feel short of breath.  Still with increased work of breathing and wheezing. [JK]  1131 Case discussed with Dr Robb Matar. [JK]    Clinical Course User Index [JK] Linwood Dibbles, MD                                 Medical Decision Making Problems Addressed: Severe persistent asthma with exacerbation: acute illness or injury that poses a threat to life or bodily functions Upper respiratory tract infection, unspecified type: acute illness or injury that poses a threat to life or bodily functions  Amount and/or Complexity of Data Reviewed Labs: ordered. Decision-making details documented in ED Course. Radiology: ordered and independent interpretation performed.  Risk Prescription drug management. Decision regarding hospitalization.   Patient presented to the ED for evaluation of shortness of breath.  Patient was diagnosed with influenza last week.  Her symptoms have persisted despite treatment.  ED workup does not show any signs of pulmonary edema or pneumonia.  Patient's BNP is normal arguing against CHF exacerbation.  Troponins are also normal.  In the ED patient had notable wheezing.  She has been given several breathing treatments as well as steroids and magnesium.  Patient continues to feel short of breath on repeat exam and she generalized wheezing.  With her persistent symptoms and failure to respond to ED management I will consult the medical service for admission        Final Clinical Impression(s) / ED Diagnoses Final diagnoses:  Severe persistent asthma with exacerbation  Upper respiratory tract infection, unspecified type    Rx / DC Orders ED Discharge Orders     None         Linwood Dibbles, MD 04/26/23 1540

## 2023-04-26 NOTE — ED Notes (Signed)
Report rec'd from previous RN

## 2023-04-26 NOTE — Assessment & Plan Note (Addendum)
Vitals:   04/26/23 0418 04/26/23 0835 04/26/23 0930 04/26/23 1006  BP: (!) 180/120 (!) 163/104 (!) 165/102 (!) 159/103   04/26/23 1030 04/26/23 1138 04/26/23 1300 04/26/23 1520  BP: (!) 157/102 (!) 178/118 (!) 156/95 (!) 185/116   04/26/23 1530 04/26/23 1720 04/26/23 1930 04/26/23 2251  BP: (!) 166/95 (!) 153/101 (!) 150/113 (!) 166/105  No blood pressure meds listed. Patient started on amlodipine in the emergency room which we will continue with as needed hydralazine.

## 2023-04-26 NOTE — Assessment & Plan Note (Addendum)
-  Continue Lipitor

## 2023-04-26 NOTE — Assessment & Plan Note (Addendum)
Continue albuterol as needed and nebulizer treatment. Continue Solu-Medrol 40 mg every 12.

## 2023-04-26 NOTE — Assessment & Plan Note (Addendum)
Patient is status post completion of Tamiflu. Will continue with as needed albuterol and neb treatments.  Antitussives as needed.  Cover for CAP coverage and for atypical coverage.

## 2023-04-26 NOTE — ED Notes (Signed)
Patient ambulated to restroom, increased work of breathing, SpO2 98-100%, HR 114 max.  Patient back to room on steady and VS updated. BBS expiratory wheezes, dry non productive cough.

## 2023-04-26 NOTE — Progress Notes (Signed)
Plan of Care Note for accepted transfer   Patient: Kersti Scavone MRN: [id number]   DOA: 04/26/2023  Facility requesting transfer: [facility]. Requesting Provider: Iantha Fallen, MD. Reason for transfer: Asthma exacerbation in the setting of influenza A. Facility course:   53 year old female with past medical history of asthma, community-acquired pneumonia, seizure disorder, GERD, hypertension, hyperlipidemia, class I obesity who was recently diagnosed with influenza A and is coming to the emergency department due to progressively worse dyspnea, wheezing, cough that has not improved to bronchodilators.  She has received an hour-long neb, magnesium sulfate and Solu-Medrol emergency department.  She does not have an oxygen requirement.  Respiratory rate in the low 20s.  Accepted to MedSurg with continuous pulse ox.  Plan of care: The patient is accepted for admission to Med-surg  unit, at [facility].  Author: Bobette Mo, MD 04/26/2023  Check [email or web address] for on-call coverage.  Nursing staff, Please call [facility] Admits & Consults System-Wide number on Amion as soon as patient's arrival, so appropriate admitting provider can evaluate the pt.

## 2023-04-26 NOTE — ED Notes (Signed)
Report given to Garfield Cornea, RN

## 2023-04-26 NOTE — Assessment & Plan Note (Addendum)
Patient is presenting with shortness of breath that started on the 21st patient was also recently diagnosed with flu around that time.  Patient is status post Tamiflu and is continuing to have shortness of breath with elevated blood pressures. Differentials include asthma, pulmonary embolism, pneumonia. Will obtain procalcitonin, CT angio chest, continue patient Solu-Medrol, and azithromycin. Will also obtain a 2D echocardiogram as patient has a history of CHF history of stroke.

## 2023-04-26 NOTE — H&P (Signed)
History and Physical    Patient: Valerie Haney ZOX:[id number] DOB: [birth year] DOA: 04/26/2023 DOS: the patient was seen and examined on 04/26/2023 PCP: Maye Hides, PA  Patient coming from: [facility] Chief complaint: Chief Complaint  Patient presents with   Shortness of Breath   Chest Pain   HPI:  Valerie Haney is a 53 y.o. female with past medical history  of  asthma, community-acquired pneumonia, seizure disorder, GERD, hypertension, hyperlipidemia, class I obesity who was diagnosed with influenza A on 21 January and is coming to the emergency department due to progressively worse dyspnea, wheezing, cough that has not improved to bronchodilators.  Chart review shows that patient was seen by ED at [facility] for cough and shortness of breath on 21 January , When she was diagnosed with influenza A and treated with Tamiflu.  Inhalers at home are not currently helping her. Chart review also shows that patient is on Adipex and migraine medication which is probably what is causing her headaches and also contributing to her uncontrolled hypertension .   >>ED Course: Patient was a direct admit [facility]  Vitals:   04/26/23 1720 04/26/23 1930 04/26/23 2047 04/26/23 2251  BP: (!) 153/101 (!) 150/113  (!) 166/105  Pulse: (!) 106 (!) 101  90  Temp: 97.7 F (36.5 C) 98.2 F (36.8 C)  98 F (36.7 C)  Resp: 18 14  20   Height:      Weight:      SpO2: 100% 100% 98% 99%  TempSrc: Oral Oral  Oral  BMI (Calculated):      EKG sinus rhythm at 83 with short PR interval of 119 and QRS of 83 QTc of 445, LVH Chest x-ray stable in appearance, negative for any acute findings. ED evaluation  so far shows: BMP shows potassium of 2.9 LFTs added on. CBC shows normal white count normal hemoglobin normal platelet count MCV of 102.4. Respiratory panel positive for influenza A. 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   In the emergency room  pt has received the following treatment thus far: Medications  ipratropium-albuterol (DUONEB) 0.5-2.5 (3) MG/3ML nebulizer solution 3 mL (3 mLs Nebulization Given 04/26/23 2047)  ipratropium-albuterol (DUONEB) 0.5-2.5 (3) MG/3ML nebulizer solution 3 mL (3 mLs Nebulization Given 04/25/23 2302)  albuterol (PROVENTIL) (2.5 MG/3ML) 0.083% nebulizer solution 2.5 mg (2.5 mg Nebulization Given 04/25/23 2302)  albuterol (PROVENTIL) (2.5 MG/3ML) 0.083% nebulizer solution 5 mg (5 mg Nebulization Given 04/26/23 0419)  ipratropium-albuterol (DUONEB) 0.5-2.5 (3) MG/3ML nebulizer solution 3 mL (3 mLs Nebulization Given 04/26/23 0824)  benzonatate (TESSALON) capsule 200 mg (200 mg Oral Given 04/26/23 0833)  albuterol (PROVENTIL) (2.5 MG/3ML) 0.083% nebulizer solution 10 mg (10 mg Nebulization Given 04/26/23 1006)  magnesium sulfate IVPB 2 g 50 mL (0 g Intravenous Stopped 04/26/23 1055)  methylPREDNISolone sodium succinate (SOLU-MEDROL) 125 mg/2 mL injection 125 mg (125 mg Intravenous Given 04/26/23 0951)  amLODipine (NORVASC) tablet 5 mg (5 mg Oral Given 04/26/23 0952)  potassium chloride SA (KLOR-CON M) CR tablet 40 mEq (40 mEq Oral Given 04/26/23 1021)   Review of Systems  Respiratory:  Positive for cough and shortness of breath.   Cardiovascular:  Positive for chest pain.   Past Medical History:  Diagnosis Date   Asthma    Blood transfusion    Cardiac arrhythmia    CHF (congestive  heart failure) (HCC)    Hypertension    Migraine    Seizure (HCC)    Stroke [facility])    Past Surgical History:  Procedure Laterality Date   ABDOMINAL HYSTERECTOMY     ORTHOPEDIC SURGERY     TONSILLECTOMY      reports that she has never smoked. She has never used smokeless tobacco. She reports that she does not drink alcohol and does not use drugs.  Allergies  Allergen Reactions   Lisinopril Swelling and Other (See Comments)     Hands and feet swelled    Family History  Problem Relation Age of Onset   Dementia Mother     Prior to Admission medications   Medication Sig Start Date End Date Taking? Authorizing Provider  albuterol (PROVENTIL) (2.5 MG/3ML) 0.083% nebulizer solution Take 2.5 mg by nebulization every 3 (three) hours as needed for wheezing or shortness of breath.   Yes [provider]  albuterol (VENTOLIN HFA) 108 (90 Base) MCG/ACT inhaler Inhale 2 puffs into the lungs every 4 (four) hours as needed for wheezing or shortness of breath.   Yes [provider]  ALPRAZolam Prudy Feeler) 1 MG tablet Take 1 mg by mouth daily as needed for anxiety. 11/04/20  Yes [provider]  budesonide-formoterol (SYMBICORT) 160-4.5 MCG/ACT inhaler Inhale 2 puffs into the lungs in the morning and at bedtime. 10/29/21  Yes Almon Hercules, MD  phentermine (ADIPEX-P) 37.5 MG tablet Take 37.5 mg by mouth daily before breakfast.   Yes [provider]  Ubrogepant (UBRELVY) 100 MG TABS Take 100 mg by mouth as needed (migraine). 10/07/20  Yes [provider]  atorvastatin (LIPITOR) 40 MG tablet Take 40 mg by mouth daily. Patient not taking: Reported on 10/26/2021 05/31/16   [provider]  OXcarbazepine ER 600 MG TB24 Take 600 mg by mouth daily. 08/13/21 04/26/23  Windell Norfolk, MD  oxyCODONE-acetaminophen (PERCOCET) 10-325 MG tablet Take 1 tablet by mouth in the morning, at noon, in the evening, and at bedtime.    [provider]  pantoprazole (PROTONIX) 20 MG tablet Take 1 tablet (20 mg total) by mouth daily. 12/20/21   Arby Barrette, MD  propranolol ER (INDERAL LA) 60 MG 24 hr capsule Take 1 capsule (60 mg total) by mouth daily. 11/26/21 04/26/23  Windell Norfolk, MD   Vitals:   04/26/23 1720 04/26/23 1930 04/26/23 2047 04/26/23 2251  BP: (!) 153/101 (!) 150/113  (!) 166/105  Pulse: (!) 106 (!) 101  90  Resp: 18 14  20   Temp: 97.7 F (36.5 C) 98.2 F (36.8 C)  98 F (36.7 C)   TempSrc: Oral Oral  Oral  SpO2: 100% 100% 98% 99%  Weight:      Height:       Physical Exam Vitals and nursing note reviewed.  Constitutional:      General: She is not in acute distress. HENT:     Head: Normocephalic and atraumatic.     Right Ear: Hearing normal.     Left Ear: Hearing normal.     Nose: Nose normal. No nasal deformity.     Mouth/Throat:     Lips: Pink.     Tongue: No lesions.  Eyes:     General: Lids are normal.     Extraocular Movements: Extraocular movements intact.  Cardiovascular:     Rate and Rhythm: Normal rate and regular rhythm.     Heart sounds: Normal heart sounds.  Pulmonary:     Effort: Pulmonary  effort is normal.     Breath sounds: Wheezing present.  Abdominal:     General: Bowel sounds are normal. There is no distension.     Palpations: Abdomen is soft. There is no mass.     Tenderness: There is no abdominal tenderness.  Musculoskeletal:     Right lower leg: No edema.     Left lower leg: No edema.  Skin:    General: Skin is warm.  Neurological:     General: No focal deficit present.     Mental Status: She is alert and oriented to person, place, and time.     Cranial Nerves: Cranial nerves 2-12 are intact.  Psychiatric:        Attention and Perception: Attention normal.        Mood and Affect: Mood normal.        Speech: Speech normal.        Behavior: Behavior normal. Behavior is cooperative.      Labs on Admission: I have personally reviewed following labs and imaging studies Results for orders placed or performed during the hospital encounter of 04/26/23 (from the past 24 hours)  Basic metabolic panel     Status: Abnormal   Collection Time: 04/26/23  4:39 AM  Result Value Ref Range   Sodium 135 135 - 145 mmol/L   Potassium 2.9 (L) 3.5 - 5.1 mmol/L   Chloride 100 98 - 111 mmol/L   CO2 24 22 - 32 mmol/L   Glucose, Bld 107 (H) 70 - 99 mg/dL   BUN 11 6 - 20 mg/dL   Creatinine, Ser 7.82 0.44 - 1.00 mg/dL   Calcium 9.0 8.9 - 95.6  mg/dL   GFR, Estimated >21 >30 mL/min   Anion gap 11 5 - 15  CBC     Status: Abnormal   Collection Time: 04/26/23  4:39 AM  Result Value Ref Range   WBC 7.1 4.0 - 10.5 K/uL   RBC 3.74 (L) 3.87 - 5.11 MIL/uL   Hemoglobin 13.2 12.0 - 15.0 g/dL   HCT 86.5 78.4 - 69.6 %   MCV 102.1 (H) 80.0 - 100.0 fL   MCH 35.3 (H) 26.0 - 34.0 pg   MCHC 34.6 30.0 - 36.0 g/dL   RDW 29.5 28.4 - 13.2 %   Platelets 349 150 - 400 K/uL   nRBC 0.0 0.0 - 0.2 %  Troponin I (High Sensitivity)     Status: None   Collection Time: 04/26/23  4:39 AM  Result Value Ref Range   Troponin I (High Sensitivity) 7 <18 ng/L  Troponin I (High Sensitivity)     Status: None   Collection Time: 04/26/23  8:40 AM  Result Value Ref Range   Troponin I (High Sensitivity) 5 <18 ng/L  Brain natriuretic peptide     Status: None   Collection Time: 04/26/23  9:59 AM  Result Value Ref Range   B Natriuretic Peptide 30.6 0.0 - 100.0 pg/mL  Phosphorus     Status: None   Collection Time: 04/26/23  9:54 PM  Result Value Ref Range   Phosphorus 2.7 2.5 - 4.6 mg/dL  Vitamin G40     Status: None   Collection Time: 04/26/23  9:54 PM  Result Value Ref Range   Vitamin B-12 768 180 - 914 pg/mL  Hepatic function panel     Status: Abnormal   Collection Time: 04/26/23  9:54 PM  Result Value Ref Range   Total Protein 6.0 (L) 6.5 - 8.1  g/dL   Albumin 3.0 (L) 3.5 - 5.0 g/dL   AST 19 15 - 41 U/L   ALT 16 0 - 44 U/L   Alkaline Phosphatase 39 38 - 126 U/L   Total Bilirubin 0.7 0.0 - 1.2 mg/dL   Bilirubin, Direct 0.1 0.0 - 0.2 mg/dL   Indirect Bilirubin 0.6 0.3 - 0.9 mg/dL  D-dimer, quantitative     Status: Abnormal   Collection Time: 04/26/23  9:54 PM  Result Value Ref Range   D-Dimer, Quant 0.97 (H) 0.00 - 0.50 ug/mL-FEU   Recent Results (from the past 720 hours)  Resp panel by RT-PCR (RSV, Flu A&B, Covid) Anterior Nasal Swab     Status: Abnormal   Collection Time: 04/19/23  1:54 PM   Specimen: Anterior Nasal Swab  Result Value Ref Range  Status   SARS Coronavirus 2 by RT PCR NEGATIVE NEGATIVE Final    Comment: (NOTE) SARS-CoV-2 target nucleic acids are NOT DETECTED.  The SARS-CoV-2 RNA is generally detectable in upper respiratory specimens during the acute phase of infection. The lowest concentration of SARS-CoV-2 viral copies this assay can detect is 138 copies/mL. A negative result does not preclude SARS-Cov-2 infection and should not be used as the sole basis for treatment or other patient management decisions. A negative result may occur with  improper specimen collection/handling, submission of specimen other than nasopharyngeal swab, presence of viral mutation(s) within the areas targeted by this assay, and inadequate number of viral copies(<138 copies/mL). A negative result must be combined with clinical observations, patient history, and epidemiological information. The expected result is Negative.  Fact Sheet for Patients:  BloggerCourse.com  Fact Sheet for Healthcare Providers:  SeriousBroker.it  This test is no t yet approved or cleared by the [place] FDA and  has been authorized for detection and/or diagnosis of SARS-CoV-2 by FDA under an Emergency Use Authorization (EUA). This EUA will remain  in effect (meaning this test can be used) for the duration of the COVID-19 declaration under Section 564(b)(1) of the Act, 21 U.S.C.section 360bbb-3(b)(1), unless the authorization is terminated  or revoked sooner.       Influenza A by PCR POSITIVE (A) NEGATIVE Final   Influenza B by PCR NEGATIVE NEGATIVE Final    Comment: (NOTE) The Xpert Xpress SARS-CoV-2/FLU/RSV plus assay is intended as an aid in the diagnosis of influenza from Nasopharyngeal swab specimens and should not be used as a sole basis for treatment. Nasal washings and aspirates are unacceptable for Xpert Xpress SARS-CoV-2/FLU/RSV testing.  Fact Sheet for  Patients: BloggerCourse.com  Fact Sheet for Healthcare Providers: SeriousBroker.it  This test is not yet approved or cleared by the [place] FDA and has been authorized for detection and/or diagnosis of SARS-CoV-2 by FDA under an Emergency Use Authorization (EUA). This EUA will remain in effect (meaning this test can be used) for the duration of the COVID-19 declaration under Section 564(b)(1) of the Act, 21 U.S.C. section 360bbb-3(b)(1), unless the authorization is terminated or revoked.     Resp Syncytial Virus by PCR NEGATIVE NEGATIVE Final    Comment: (NOTE) Fact Sheet for Patients: BloggerCourse.com  Fact Sheet for Healthcare Providers: SeriousBroker.it  This test is not yet approved or cleared by the [place] FDA and has been authorized for detection and/or diagnosis of SARS-CoV-2 by FDA under an Emergency Use Authorization (EUA). This EUA will remain in effect (meaning this test can be used) for the duration of the COVID-19 declaration under Section 564(b)(1) of the Act, 21  U.S.C. section 360bbb-3(b)(1), unless the authorization is terminated or revoked.  Performed at [facility], [address] [place], [place] [address]    CBC:    Latest Ref Rng & Units 04/26/2023    4:39 AM 08/16/2022   11:24 PM 12/20/2021    5:51 PM  CBC  WBC 4.0 - 10.5 K/uL 7.1  5.6  4.5   Hemoglobin 12.0 - 15.0 g/dL 60.4  54.0  98.1   Hematocrit 36.0 - 46.0 % 38.2  35.5  37.4   Platelets 150 - 400 K/uL 349  303  335    Basic Metabolic Panel: Recent Labs  Lab 04/26/23 0439 04/26/23 2154  NA 135  --   K 2.9*  --   CL 100  --   CO2 24  --   GLUCOSE 107*  --   BUN 11  --   CREATININE 0.75  --   CALCIUM 9.0  --   PHOS  --  2.7   Creatinine: Lab Results  Component Value Date   CREATININE 0.75 04/26/2023   CREATININE 0.86 08/16/2022   CREATININE 1.15 (H)  12/20/2021   Liver Function Tests:    Latest Ref Rng & Units 04/26/2023    9:54 PM 12/20/2021    5:51 PM 10/26/2021    4:42 AM  Hepatic Function  Total Protein 6.5 - 8.1 g/dL 6.0  7.5  6.9   Albumin 3.5 - 5.0 g/dL 3.0  4.1  3.9   AST 15 - 41 U/L 19  25  35   ALT 0 - 44 U/L 16  16  19    Alk Phosphatase 38 - 126 U/L 39  81  80   Total Bilirubin 0.0 - 1.2 mg/dL 0.7  0.7  0.8   Bilirubin, Direct 0.0 - 0.2 mg/dL 0.1  0.1     Radiological Exams on Admission: DG Chest 2 View Result Date: 04/25/2023 CLINICAL DATA:  Shortness of breath. Persistent shortness of breath after recent flu diagnosis. EXAM: CHEST - 2 VIEW COMPARISON:  04/19/2023 FINDINGS: The cardiomediastinal contours are stable. Unchanged aortic tortuosity. Pulmonary vasculature is normal. No consolidation, pleural effusion, or pneumothorax. No acute osseous abnormalities are seen. IMPRESSION: Stable radiographic appearance of the chest.  No acute findings. Electronically Signed   By: Narda Rutherford M.D.   On: 04/25/2023 23:44    Data Reviewed: Relevant notes from primary care and specialist visits, past discharge summaries as available in EHR, including Care Everywhere. Prior diagnostic testing as pertinent to current admission diagnoses, Updated medications and problem lists for reconciliation ED course, including vitals, labs, imaging, treatment and response to treatment,Triage notes, nursing and pharmacy notes and ED provider's notes Notable results as noted in HPI.Discussed case with EDMD/ ED APP/ or Specialty MD on call and as needed.  Assessment & Plan SOB (shortness of breath) Patient is presenting with shortness of breath that started on the 21st patient was also recently diagnosed with flu around that time.  Patient is status post Tamiflu and is continuing to have shortness of breath with elevated blood pressures. Differentials include asthma, pulmonary embolism, pneumonia. Will obtain procalcitonin, CT angio chest,  continue patient Solu-Medrol, and azithromycin. Will also obtain a 2D echocardiogram as patient has a history of CHF history of stroke. Influenza A Patient is status post completion of Tamiflu. Will continue with as needed albuterol and neb treatments.  Antitussives as needed.  Cover for CAP coverage and for atypical coverage. Esophageal reflux Aspiration precaution, IV PPI.  Essential hypertension Vitals:   04/26/23 0418 04/26/23 0835 04/26/23 0930 04/26/23 1006  BP: (!) 180/120 (!) 163/104 (!) 165/102 (!) 159/103   04/26/23 1030 04/26/23 1138 04/26/23 1300 04/26/23 1520  BP: (!) 157/102 (!) 178/118 (!) 156/95 (!) 185/116   04/26/23 1530 04/26/23 1720 04/26/23 1930 04/26/23 2251  BP: (!) 166/95 (!) 153/101 (!) 150/113 (!) 166/105  No blood pressure meds listed. Patient started on amlodipine in the emergency room which we will continue with as needed hydralazine. Seizure disorder (HCC) Aspiration, seizure precautions. Continue oxcarbazepine. GAD (generalized anxiety disorder) Patient given single dose of alprazolam med rec is pending. HLD (hyperlipidemia) Continue Lipitor. Asthma exacerbation Continue albuterol as needed and nebulizer treatment. Continue Solu-Medrol 40 mg every 12.  DVT prophylaxis:  Heparin Consults:  None Advance Care Planning:    Code Status: Full Code   Family Communication:  None Disposition Plan:  Home Severity of Illness: The appropriate patient status for this patient is OBSERVATION. Observation status is judged to be reasonable and necessary in order to provide the required intensity of service to ensure the patient's safety. The patient's presenting symptoms, physical exam findings, and initial radiographic and laboratory data in the context of their medical condition is felt to place them at decreased risk for further clinical deterioration. Furthermore, it is anticipated that the patient will be medically stable for discharge from the hospital  within 2 midnights of admission.   Author: Gertha Calkin, MD 04/26/2023 11:53 PM  For on call review [email or web address].   Unresulted Labs (From admission, onward)     Start     Ordered   04/27/23 0500  Comprehensive metabolic panel  Tomorrow morning,   R        04/26/23 2120   04/27/23 0500  CBC  Tomorrow morning,   R        04/26/23 2120   04/27/23 0500  Magnesium  Tomorrow morning,   R        04/26/23 2120   04/26/23 2338  Brain natriuretic peptide  Add-on,   AD        04/26/23 2337   04/26/23 2121  Folate  Add-on,   AD        04/26/23 2122   04/26/23 2121  RPR  Add-on,   AD        04/26/23 2122   04/26/23 2118  HIV Antibody (routine testing w rflx)  (HIV Antibody (Routine testing w reflex) panel)  Once,   R        04/26/23 2120            Orders Placed This Encounter  Procedures   Critical Care   DG Chest 2 View   CT Angio Chest Pulmonary Embolism (PE) W or WO Contrast   Basic metabolic panel   CBC   Brain natriuretic peptide   HIV Antibody (routine testing w rflx)   Comprehensive metabolic panel   CBC   Magnesium   Phosphorus   Vitamin B12   Folate   RPR   Hepatic function panel   D-dimer, quantitative   Brain natriuretic peptide   Diet Heart Room service appropriate? Yes; Fluid consistency: Thin   Document Height and Actual Weight   Maintain IV access   Vital signs   Notify physician (specify)   Mobility Protocol: No Restrictions RN to initiate protocols based on patient's level of care   Refer to Sidebar Report Refer to ICU, Med-Surg, Progressive, and Step-Down Mobility Protocol Sidebars  Initiate Adult Central Line Maintenance and Catheter Protocol for patients with central line (CVC, PICC, Port, Hemodialysis, Trialysis)   Daily weights   Intake and Output   Do not place and if present remove PureWick   Initiate Oral Care Protocol   Initiate Carrier Fluid Protocol   RN may order General Admission PRN Orders utilizing "General Admission PRN  medications" (through manage orders) for the following patient needs: allergy symptoms (Claritin), cold sores (Carmex), cough (Robitussin DM), eye irritation (Liquifilm Tears), hemorrhoids (Tucks), indigestion (Maalox), minor skin irritation (Hydrocortisone Cream), muscle pain Romeo Apple Gay), nose irritation (saline nasal spray) and sore throat (Chloraseptic spray).   Patient has an active order for admit to inpatient/place in observation   Cardiac Monitoring Continuous x 24 hours Indications for use: Other; other indications for use: HTN/ Chest pain   Full code   Consult to hospitalist   Pulse oximetry, continuous   Pulse oximetry check with vital signs   Oxygen therapy Mode or (Route): Nasal cannula; Liters Per Minute: 2; Keep O2 saturation between: greater than 92 %   EKG 12-Lead   ED EKG   EKG   EKG   ECHOCARDIOGRAM COMPLETE BUBBLE STUDY   Place in observation (patient's expected length of stay will be less than 2 midnights)   Aspiration precautions   Fall precautions   Seizure precautions

## 2023-04-26 NOTE — Assessment & Plan Note (Addendum)
Aspiration, seizure precautions. Continue oxcarbazepine.

## 2023-04-27 ENCOUNTER — Observation Stay (HOSPITAL_COMMUNITY): Payer: MEDICAID

## 2023-04-27 DIAGNOSIS — J4550 Severe persistent asthma, uncomplicated: Secondary | ICD-10-CM | POA: Diagnosis present

## 2023-04-27 DIAGNOSIS — J069 Acute upper respiratory infection, unspecified: Secondary | ICD-10-CM | POA: Diagnosis not present

## 2023-04-27 DIAGNOSIS — J101 Influenza due to other identified influenza virus with other respiratory manifestations: Secondary | ICD-10-CM | POA: Diagnosis present

## 2023-04-27 DIAGNOSIS — I10 Essential (primary) hypertension: Secondary | ICD-10-CM

## 2023-04-27 DIAGNOSIS — I110 Hypertensive heart disease with heart failure: Secondary | ICD-10-CM | POA: Diagnosis present

## 2023-04-27 DIAGNOSIS — F411 Generalized anxiety disorder: Secondary | ICD-10-CM | POA: Diagnosis present

## 2023-04-27 DIAGNOSIS — Z6831 Body mass index (BMI) 31.0-31.9, adult: Secondary | ICD-10-CM | POA: Diagnosis not present

## 2023-04-27 DIAGNOSIS — J4551 Severe persistent asthma with (acute) exacerbation: Secondary | ICD-10-CM | POA: Diagnosis present

## 2023-04-27 DIAGNOSIS — Z7951 Long term (current) use of inhaled steroids: Secondary | ICD-10-CM | POA: Diagnosis not present

## 2023-04-27 DIAGNOSIS — Z888 Allergy status to other drugs, medicaments and biological substances status: Secondary | ICD-10-CM | POA: Diagnosis not present

## 2023-04-27 DIAGNOSIS — Z1152 Encounter for screening for COVID-19: Secondary | ICD-10-CM | POA: Diagnosis not present

## 2023-04-27 DIAGNOSIS — I5032 Chronic diastolic (congestive) heart failure: Secondary | ICD-10-CM | POA: Diagnosis present

## 2023-04-27 DIAGNOSIS — Z90710 Acquired absence of both cervix and uterus: Secondary | ICD-10-CM | POA: Diagnosis not present

## 2023-04-27 DIAGNOSIS — J1001 Influenza due to other identified influenza virus with the same other identified influenza virus pneumonia: Secondary | ICD-10-CM | POA: Diagnosis present

## 2023-04-27 DIAGNOSIS — E66811 Obesity, class 1: Secondary | ICD-10-CM | POA: Diagnosis present

## 2023-04-27 DIAGNOSIS — Z8673 Personal history of transient ischemic attack (TIA), and cerebral infarction without residual deficits: Secondary | ICD-10-CM | POA: Diagnosis not present

## 2023-04-27 DIAGNOSIS — R0602 Shortness of breath: Secondary | ICD-10-CM | POA: Diagnosis not present

## 2023-04-27 DIAGNOSIS — K219 Gastro-esophageal reflux disease without esophagitis: Secondary | ICD-10-CM | POA: Diagnosis present

## 2023-04-27 DIAGNOSIS — E785 Hyperlipidemia, unspecified: Secondary | ICD-10-CM | POA: Diagnosis present

## 2023-04-27 DIAGNOSIS — Z79899 Other long term (current) drug therapy: Secondary | ICD-10-CM | POA: Diagnosis not present

## 2023-04-27 DIAGNOSIS — Z91148 Patient's other noncompliance with medication regimen for other reason: Secondary | ICD-10-CM | POA: Diagnosis not present

## 2023-04-27 DIAGNOSIS — G40909 Epilepsy, unspecified, not intractable, without status epilepticus: Secondary | ICD-10-CM | POA: Diagnosis present

## 2023-04-27 DIAGNOSIS — J1100 Influenza due to unidentified influenza virus with unspecified type of pneumonia: Secondary | ICD-10-CM | POA: Diagnosis present

## 2023-04-27 LAB — MAGNESIUM: Magnesium: 2.3 mg/dL (ref 1.7–2.4)

## 2023-04-27 LAB — COMPREHENSIVE METABOLIC PANEL
ALT: 81 U/L — ABNORMAL HIGH (ref 0–44)
AST: 70 U/L — ABNORMAL HIGH (ref 15–41)
Albumin: 3.8 g/dL (ref 3.5–5.0)
Alkaline Phosphatase: 60 U/L (ref 38–126)
Anion gap: 12 (ref 5–15)
BUN: 9 mg/dL (ref 6–20)
CO2: 19 mmol/L — ABNORMAL LOW (ref 22–32)
Calcium: 9 mg/dL (ref 8.9–10.3)
Chloride: 105 mmol/L (ref 98–111)
Creatinine, Ser: 0.67 mg/dL (ref 0.44–1.00)
GFR, Estimated: >60 mL/min (ref >60)
Glucose, Bld: 155 mg/dL — ABNORMAL HIGH (ref 70–99)
Potassium: 3.8 mmol/L (ref 3.5–5.1)
Sodium: 136 mmol/L (ref 135–145)
Total Bilirubin: 0.7 mg/dL (ref 0.0–1.2)
Total Protein: 6.9 g/dL (ref 6.5–8.1)

## 2023-04-27 LAB — CBC
HCT: 35.5 % — ABNORMAL LOW (ref 36.0–46.0)
Hemoglobin: 11.8 g/dL — ABNORMAL LOW (ref 12.0–15.0)
MCH: 34.8 pg — ABNORMAL HIGH (ref 26.0–34.0)
MCHC: 33.2 g/dL (ref 30.0–36.0)
MCV: 104.7 fL — ABNORMAL HIGH (ref 80.0–100.0)
Platelets: 338 10*3/uL (ref 150–400)
RBC: 3.39 MIL/uL — ABNORMAL LOW (ref 3.87–5.11)
RDW: 14.3 % (ref 11.5–15.5)
WBC: 8.5 10*3/uL (ref 4.0–10.5)
nRBC: 0 % (ref 0.0–0.2)

## 2023-04-27 LAB — ECHOCARDIOGRAM COMPLETE BUBBLE STUDY
AV Mean grad: 2 mm[Hg]
AV Peak grad: 4.2 mm[Hg]
Ao pk vel: 1.02 m/s
Area-P 1/2: 3.89 cm2
S' Lateral: 2.5 cm

## 2023-04-27 LAB — RPR: RPR Ser Ql: NONREACTIVE

## 2023-04-27 LAB — BRAIN NATRIURETIC PEPTIDE: B Natriuretic Peptide: 94.5 pg/mL (ref 0.0–100.0)

## 2023-04-27 LAB — HIV ANTIBODY (ROUTINE TESTING W REFLEX): HIV Screen 4th Generation wRfx: NONREACTIVE

## 2023-04-27 LAB — FOLATE: Folate: 25.7 ng/mL (ref >5.9)

## 2023-04-27 MED ORDER — LORAZEPAM 2 MG/ML IJ SOLN
2.0000 mg | INTRAMUSCULAR | Status: DC | PRN
  Administered 2023-04-27: 2 mg via INTRAVENOUS
  Filled 2023-04-27: qty 1

## 2023-04-27 MED ORDER — PANTOPRAZOLE SODIUM 40 MG PO TBEC
40.0000 mg | DELAYED_RELEASE_TABLET | Freq: Every day | ORAL | Status: DC
  Administered 2023-04-28: 40 mg via ORAL
  Filled 2023-04-27: qty 1

## 2023-04-27 MED ORDER — OXYCODONE-ACETAMINOPHEN 10-325 MG PO TABS: 1.0000 | ORAL_TABLET | Freq: Four times a day (QID) | ORAL | Status: DC | PRN

## 2023-04-27 MED ORDER — OXYCODONE-ACETAMINOPHEN 5-325 MG PO TABS
1.0000 | ORAL_TABLET | Freq: Four times a day (QID) | ORAL | Status: DC | PRN
  Administered 2023-04-27 (×3): 1 via ORAL
  Filled 2023-04-27 (×4): qty 1

## 2023-04-27 MED ORDER — HYDROCHLOROTHIAZIDE 12.5 MG PO TABS
12.5000 mg | ORAL_TABLET | Freq: Every day | ORAL | Status: DC
  Administered 2023-04-28: 12.5 mg via ORAL
  Filled 2023-04-27: qty 1

## 2023-04-27 MED ORDER — SODIUM CHLORIDE (PF) 0.9 % IJ SOLN
INTRAMUSCULAR | Status: AC
Start: 2023-04-27 — End: ?
  Filled 2023-04-27: qty 50

## 2023-04-27 MED ORDER — IOHEXOL 350 MG/ML SOLN
75.0000 mL | Freq: Once | INTRAVENOUS | Status: AC | PRN
  Administered 2023-04-27: 75 mL via INTRAVENOUS

## 2023-04-27 MED ORDER — AZITHROMYCIN 250 MG PO TABS
500.0000 mg | ORAL_TABLET | Freq: Every day | ORAL | Status: DC
  Administered 2023-04-27: 500 mg via ORAL
  Filled 2023-04-27: qty 2

## 2023-04-27 MED ORDER — ATORVASTATIN CALCIUM 20 MG PO TABS
40.0000 mg | ORAL_TABLET | Freq: Every day | ORAL | Status: DC
  Administered 2023-04-28: 40 mg via ORAL
  Filled 2023-04-27: qty 2

## 2023-04-27 MED ORDER — SODIUM CHLORIDE (PF) 0.9 % IJ SOLN
INTRAMUSCULAR | Status: AC
  Filled 2023-04-27: qty 50

## 2023-04-27 MED ORDER — OXYCODONE HCL 5 MG PO TABS
5.0000 mg | ORAL_TABLET | Freq: Four times a day (QID) | ORAL | Status: DC | PRN
  Administered 2023-04-27 (×3): 5 mg via ORAL
  Filled 2023-04-27 (×4): qty 1

## 2023-04-27 MED ORDER — OXCARBAZEPINE ER 300 MG PO TB24
600.0000 mg | ORAL_TABLET | Freq: Every day | ORAL | Status: DC
  Filled 2023-04-27 (×2): qty 2

## 2023-04-27 MED ORDER — NITROGLYCERIN 0.4 MG SL SUBL: 0.4000 mg | SUBLINGUAL_TABLET | SUBLINGUAL | Status: DC | PRN

## 2023-04-27 MED ORDER — PANTOPRAZOLE SODIUM 20 MG PO TBEC: 20.0000 mg | DELAYED_RELEASE_TABLET | Freq: Every day | ORAL | Status: DC

## 2023-04-27 NOTE — Plan of Care (Signed)
Problem: Education: Goal: Knowledge of General Education information will improve Description Including pain rating scale, medication(s)/side effects and non-pharmacologic comfort measures Outcome: Progressing   Problem: Health Behavior/Discharge Planning: Goal: Ability to manage health-related needs will improve Outcome: Progressing

## 2023-04-27 NOTE — Progress Notes (Signed)
  Echocardiogram 2D Echocardiogram has been performed.  Leda Roys RDCS 04/27/2023, 9:34 AM

## 2023-04-27 NOTE — TOC CM/SW Note (Signed)
Transition of Care [facility]) - Inpatient Brief Assessment   Patient Details  Name: Valerie Haney MRN: [id number] Date of Birth: [birth year]  Transition of Care [facility]) CM/SW Contact:    Howell Rucks, RN Phone Number: 04/27/2023, 1:37 PM   Clinical Narrative: Call to pt's room to introduce role of TOC/NCM and review for dc planning, pt reports she has an established PCP and pharmacy, no current home care services, reports she has a power wheelchair, pt reports she feels safe returning home, confirmed transportation available at discharge. TOC Brief Assessment completed. No TOC needs identified at this time.     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

## 2023-04-27 NOTE — Progress Notes (Signed)
Verbal order by MD Irena Cords for PRN Nitroglycerin sublingual.

## 2023-04-27 NOTE — Progress Notes (Signed)
Patient refused CT

## 2023-04-27 NOTE — Progress Notes (Signed)
[facility] PROGRESS NOTE    Progress Note  Valerie Haney  ZOX:[id number] DOB: 1971-03-02 DOA: 04/26/2023 PCP: Maye Hides, PA     Brief Narrative:   Valerie Haney is an 53 y.o. female past medical history significant for asthma community-acquired pneumonia seizure disorder, essential hypertension obesity diagnosed with influenza A in January 21 treated with Tamiflu comes into the ED with progressive progressive shortness of breath and cough.  Assessment/Plan:   Influenza A pneumonia: She completed her course of Tamiflu as an outpatient. D-dimer was obtained and was elevated, CT angio is pending at the time of the dictation. She was started on Solu-Medrol and azithromycin. 2D echo is pending Continue antitussive medication. She is satting greater than 90% on room air. Patient still feels short of breath she has no appetite.  GERD: Continue Protonix.  Essential hypertension: Blood pressure is elevated this morning, she is on no antihypertensive medication at home she was started on amlodipine is improved. Started on a low-dose diuretic  History of seizure disorder: Continue carbamazepine. Ativan as needed for seizures.  General anxiety disorder: Noted.  Hyperlipidemia: Resume Lipitor.  History of asthma: Continue inhalers.  DVT prophylaxis: lovenox Family Communication:non Status is: Observation The patient will require care spanning > 2 midnights and should be moved to inpatient because: Short of breath no appetite    Code Status:     Code Status Orders  (From admission, onward)           Start     Ordered   04/26/23 2119  Full code  Continuous       Question:  By:  Answer:  Other   04/26/23 2120           Code Status History     Date Active Date Inactive Code Status Order ID Comments User Context   10/25/2021 2109 10/29/2021 2326 Full Code [id number]  Angie Fava, DO Inpatient         IV Access:   Peripheral  IV   Procedures and diagnostic studies:   DG Chest 2 View Result Date: 04/25/2023 CLINICAL DATA:  Shortness of breath. Persistent shortness of breath after recent flu diagnosis. EXAM: CHEST - 2 VIEW COMPARISON:  04/19/2023 FINDINGS: The cardiomediastinal contours are stable. Unchanged aortic tortuosity. Pulmonary vasculature is normal. No consolidation, pleural effusion, or pneumothorax. No acute osseous abnormalities are seen. IMPRESSION: Stable radiographic appearance of the chest.  No acute findings. Electronically Signed   By: Narda Rutherford M.D.   On: 04/25/2023 23:44     Medical Consultants:   None.   Subjective:    Valerie Haney relates her breathing is not improved has no appetite.  Objective:    Vitals:   04/26/23 2047 04/26/23 2251 04/27/23 0219 04/27/23 0616  BP:  (!) 166/105 (!) 162/103 (!) 148/95  Pulse:  90 81 91  Resp:  20 15 17   Temp:  98 F (36.7 C) 98.3 F (36.8 C) 98.2 F (36.8 C)  TempSrc:  Oral Oral Oral  SpO2: 98% 99% 98% 99%  Weight:      Height:       SpO2: 99 %   Intake/Output Summary (Last 24 hours) at 04/27/2023 0730 Last data filed at 04/27/2023 0600 Gross per 24 hour  Intake 776.28 ml  Output 0 ml  Net 776.28 ml   Filed Weights   04/25/23 2308  Weight: 84.8 kg    Exam: General exam: In no acute distress. Respiratory system: Good air movement and mild wheezing  bilaterally Cardiovascular system: S1 & S2 heard, RRR. No JVD. Gastrointestinal system: Abdomen is nondistended, soft and nontender.  Extremities: No pedal edema. Skin: No rashes, lesions or ulcers Psychiatry: Judgement and insight appear normal. Mood & affect appropriate.    Data Reviewed:    Labs: Basic Metabolic Panel: Recent Labs  Lab 04/26/23 0439 04/26/23 2154 04/27/23 0504  NA 135  --  136  K 2.9*  --  3.8  CL 100  --  105  CO2 24  --  19*  GLUCOSE 107*  --  155*  BUN 11  --  9  CREATININE 0.75  --  0.67  CALCIUM 9.0  --  9.0  MG  --   --  2.3   PHOS  --  2.7  --    GFR Estimated Creatinine Clearance: 86.4 mL/min (by C-G formula based on SCr of 0.67 mg/dL). Liver Function Tests: Recent Labs  Lab 04/26/23 2154 04/27/23 0504  AST 19 70*  ALT 16 81*  ALKPHOS 39 60  BILITOT 0.7 0.7  PROT 6.0* 6.9  ALBUMIN 3.0* 3.8   No results for input(s): "LIPASE", "AMYLASE" in the last 168 hours. No results for input(s): "AMMONIA" in the last 168 hours. Coagulation profile No results for input(s): "INR", "PROTIME" in the last 168 hours. COVID-19 Labs  Recent Labs    04/26/23 2154  DDIMER 0.97*    Lab Results  Component Value Date   SARSCOV2NAA NEGATIVE 04/19/2023   SARSCOV2NAA NEGATIVE 10/25/2021   SARSCOV2NAA NEGATIVE 10/06/2020   SARSCOV2NAA NOT DETECTED 05/02/2019    CBC: Recent Labs  Lab 04/26/23 0439 04/27/23 0504  WBC 7.1 8.5  HGB 13.2 11.8*  HCT 38.2 35.5*  MCV 102.1* 104.7*  PLT 349 338   Cardiac Enzymes: No results for input(s): "CKTOTAL", "CKMB", "CKMBINDEX", "TROPONINI" in the last 168 hours. BNP (last 3 results) No results for input(s): "PROBNP" in the last 8760 hours. CBG: No results for input(s): "GLUCAP" in the last 168 hours. D-Dimer: Recent Labs    04/26/23 2154  DDIMER 0.97*   Hgb A1c: No results for input(s): "HGBA1C" in the last 72 hours. Lipid Profile: No results for input(s): "CHOL", "HDL", "LDLCALC", "TRIG", "CHOLHDL", "LDLDIRECT" in the last 72 hours. Thyroid function studies: No results for input(s): "TSH", "T4TOTAL", "T3FREE", "THYROIDAB" in the last 72 hours.  Invalid input(s): "FREET3" Anemia work up: Recent Labs    04/26/23 2154  VITAMINB12 768  FOLATE 25.7   Sepsis Labs: Recent Labs  Lab 04/26/23 0439 04/27/23 0504  WBC 7.1 8.5   Microbiology Recent Results (from the past 240 hours)  Resp panel by RT-PCR (RSV, Flu A&B, Covid) Anterior Nasal Swab     Status: Abnormal   Collection Time: 04/19/23  1:54 PM   Specimen: Anterior Nasal Swab  Result Value Ref Range  Status   SARS Coronavirus 2 by RT PCR NEGATIVE NEGATIVE Final    Comment: (NOTE) SARS-CoV-2 target nucleic acids are NOT DETECTED.  The SARS-CoV-2 RNA is generally detectable in upper respiratory specimens during the acute phase of infection. The lowest concentration of SARS-CoV-2 viral copies this assay can detect is 138 copies/mL. A negative result does not preclude SARS-Cov-2 infection and should not be used as the sole basis for treatment or other patient management decisions. A negative result may occur with  improper specimen collection/handling, submission of specimen other than nasopharyngeal swab, presence of viral mutation(s) within the areas targeted by this assay, and inadequate number of viral copies(<138 copies/mL). A negative result  must be combined with clinical observations, patient history, and epidemiological information. The expected result is Negative.  Fact Sheet for Patients:  BloggerCourse.com  Fact Sheet for Healthcare Providers:  SeriousBroker.it  This test is no t yet approved or cleared by the [place] FDA and  has been authorized for detection and/or diagnosis of SARS-CoV-2 by FDA under an Emergency Use Authorization (EUA). This EUA will remain  in effect (meaning this test can be used) for the duration of the COVID-19 declaration under Section 564(b)(1) of the Act, 21 U.S.C.section 360bbb-3(b)(1), unless the authorization is terminated  or revoked sooner.       Influenza A by PCR POSITIVE (A) NEGATIVE Final   Influenza B by PCR NEGATIVE NEGATIVE Final    Comment: (NOTE) The Xpert Xpress SARS-CoV-2/FLU/RSV plus assay is intended as an aid in the diagnosis of influenza from Nasopharyngeal swab specimens and should not be used as a sole basis for treatment. Nasal washings and aspirates are unacceptable for Xpert Xpress SARS-CoV-2/FLU/RSV testing.  Fact Sheet for  Patients: BloggerCourse.com  Fact Sheet for Healthcare Providers: SeriousBroker.it  This test is not yet approved or cleared by the [place] FDA and has been authorized for detection and/or diagnosis of SARS-CoV-2 by FDA under an Emergency Use Authorization (EUA). This EUA will remain in effect (meaning this test can be used) for the duration of the COVID-19 declaration under Section 564(b)(1) of the Act, 21 U.S.C. section 360bbb-3(b)(1), unless the authorization is terminated or revoked.     Resp Syncytial Virus by PCR NEGATIVE NEGATIVE Final    Comment: (NOTE) Fact Sheet for Patients: BloggerCourse.com  Fact Sheet for Healthcare Providers: SeriousBroker.it  This test is not yet approved or cleared by the [place] FDA and has been authorized for detection and/or diagnosis of SARS-CoV-2 by FDA under an Emergency Use Authorization (EUA). This EUA will remain in effect (meaning this test can be used) for the duration of the COVID-19 declaration under Section 564(b)(1) of the Act, 21 U.S.C. section 360bbb-3(b)(1), unless the authorization is terminated or revoked.  Performed at [facility], [address] [place], [place] [address]      Medications:    amLODipine  5 mg Oral Daily   benzonatate  100 mg Oral TID   fluticasone furoate-vilanterol  1 puff Inhalation Daily   heparin  5,000 Units Subcutaneous Q12H   ipratropium-albuterol  3 mL Nebulization QID   methylPREDNISolone (SOLU-MEDROL) injection  40 mg Intravenous Q12H   pantoprazole (PROTONIX) IV  40 mg Intravenous QHS   sodium chloride flush  3 mL Intravenous Q12H   Continuous Infusions:  azithromycin 500 mg (04/27/23 0102)      LOS: 0 days   Marinda Elk  [facility]  04/27/2023, 7:30 AM

## 2023-04-28 DIAGNOSIS — J069 Acute upper respiratory infection, unspecified: Secondary | ICD-10-CM

## 2023-04-28 DIAGNOSIS — R0602 Shortness of breath: Secondary | ICD-10-CM | POA: Diagnosis not present

## 2023-04-28 MED ORDER — AZITHROMYCIN 250 MG PO TABS: ORAL_TABLET | ORAL | 0 refills | Status: DC

## 2023-04-28 MED ORDER — PREDNISONE 5 MG PO TABS: 50.0000 mg | ORAL_TABLET | Freq: Every day | ORAL | Status: DC

## 2023-04-28 MED ORDER — MORPHINE SULFATE (PF) 2 MG/ML IV SOLN
1.0000 mg | Freq: Once | INTRAVENOUS | Status: AC
  Administered 2023-04-28: 1 mg via INTRAVENOUS
  Filled 2023-04-28: qty 1

## 2023-04-28 MED ORDER — AMLODIPINE BESYLATE 10 MG PO TABS: 10.0000 mg | ORAL_TABLET | Freq: Every day | ORAL | 11 refills | Status: AC

## 2023-04-28 MED ORDER — PREDNISONE 10 MG PO TABS
ORAL_TABLET | ORAL | 0 refills | Status: DC
Start: 2023-04-28 — End: 2023-06-26

## 2023-04-28 NOTE — TOC Transition Note (Signed)
Transition of Care [facility]) - Discharge Note   Patient Details  Name: Valerie Haney MRN: [id number] Date of Birth: 11-Oct-1970  [facility] CM/SW Contact:  Howell Rucks, RN Phone Number: 04/28/2023, 10:52 AM   Clinical Narrative:  DC to home. Order for home 02, [employer], rep-Jermaine to deliver to bedside. No further TOC needs identified.      Final next level of care: Home/Self Care Barriers to Discharge: No Barriers Identified   Patient Goals and CMS Choice Patient states their goals for this hospitalization and ongoing recovery are:: return home          Discharge Placement                       Discharge Plan and Services Additional resources added to the After Visit Summary for                  DME Arranged: Oxygen DME Agency: [employer] Date DME Agency Contacted: 04/28/23 Time DME Agency Contacted: 1052 Representative spoke with at DME Agency: Vaughan Basta            Social Drivers of Health (SDOH) Interventions SDOH Screenings   Food Insecurity: No Food Insecurity (04/26/2023)  Housing: Low Risk  (04/26/2023)  Transportation Needs: No Transportation Needs (04/26/2023)  Utilities: Not At Risk (04/26/2023)  Tobacco Use: Low Risk  (04/26/2023)     Readmission Risk Interventions    04/28/2023   10:51 AM  Readmission Risk Prevention Plan  Post Dischage Appt Complete  Medication Screening Complete  Transportation Screening Complete

## 2023-04-28 NOTE — Progress Notes (Signed)
Pt eating lunch- AVS reviewed w/ pt who verbalized an understanding. No other questions at this  time. O2 delivered to room. No PIV present. Central tele called by primary RN to remove from tele due to pt d/c

## 2023-04-28 NOTE — Plan of Care (Signed)

## 2023-04-28 NOTE — Discharge Summary (Addendum)
Physician Discharge Summary  Valerie Haney ZOX:[id number] DOB: 1970-08-21 DOA: 04/26/2023  PCP: Valerie Hides, PA  Admit date: 04/26/2023 Discharge date: 04/28/2023  Admitted From: Home Disposition:  home  Recommendations for Outpatient Follow-up:  Follow up with PCP in 1-2 weeks   Home Health:no Equipment/Devices:None  Discharge Condition:Stable CODE STATUS:Full Diet recommendation: Heart Healthy   Brief/Interim Summary: 53 y.o. female past medical history significant for asthma community-acquired pneumonia seizure disorder, essential hypertension obesity diagnosed with influenza A in January 21 treated with Tamiflu comes into the ED with progressive progressive shortness of breath and cough.   Discharge Diagnoses:  Principal Problem:   SOB (shortness of breath) Active Problems:   Influenza A   Esophageal reflux   Essential hypertension   Seizure disorder (HCC)   GAD (generalized anxiety disorder)   HLD (hyperlipidemia)   Asthma exacerbation   Influenzal pneumonia  Pneumonia due to influenza A  She relates she completed a course of Tamiflu as an outpat 2D echo showed an EF of 50% no regional wall motion abnormality.  Ient. D-dimer was positive CT angio was done showed no PE. In the admission she was started on IV Solu-Medrol and azithromycin. Her shortness of breath improved. Question if she had bronchitis she will continue steroids and azithromycin as an outpatient.  She will go on a steroid taper. Asthma exacerbation has been ruled out.  GERD: Continue Protonix.  Hypertension: She has been noncompliant with her medication as an outpatient. She was started on Norvasc will continue lisinopril and hydrochlorothiazide as an outpatient. Follow-up with PCP as an outpatient.  History of seizures: She has been noncompliant with her medication these were not resumed.  General anxiety disorder: Noted.  Hyperlipidemia: Continue statins.  History of  asthma: Continue inhalers.  Chronic diastolic heart failure: Appears euvolemic.  Discharge Instructions  Discharge Instructions     Diet - low sodium heart healthy   Complete by: As directed    Increase activity slowly   Complete by: As directed       Allergies as of 04/28/2023       Reactions   Lisinopril Swelling, Other (See Comments)   Hands and feet became swollen        Medication List     STOP taking these medications    OXcarbazepine ER 600 MG Tb24   pantoprazole 20 MG tablet Commonly known as: PROTONIX   phentermine 37.5 MG tablet Commonly known as: ADIPEX-P       TAKE these medications    ALPRAZolam 1 MG tablet Commonly known as: XANAX Take 1 mg by mouth at bedtime.   amLODipine 10 MG tablet Commonly known as: NORVASC Take 1 tablet (10 mg total) by mouth daily.   atorvastatin 40 MG tablet Commonly known as: LIPITOR Take 40 mg by mouth daily.   azithromycin 250 MG tablet Commonly known as: ZITHROMAX Take 1 tablet daily   budesonide-formoterol 160-4.5 MCG/ACT inhaler Commonly known as: SYMBICORT Inhale 2 puffs into the lungs in the morning and at bedtime.   Licart 1.3 % Pt24 Generic drug: Diclofenac Epolamine Apply 1 patch topically daily as needed (for pain).   lisinopril-hydrochlorothiazide 20-25 MG tablet Commonly known as: ZESTORETIC Take 1 tablet by mouth daily.   Narcan 4 MG/0.1ML Liqd nasal spray kit Generic drug: naloxone Place 1 spray into the nose once as needed (as needed/as directed).   Norel AD 4-10-325 MG Tabs Generic drug: Chlorphen-PE-Acetaminophen Take 1 tablet by mouth at bedtime.   oxyCODONE-acetaminophen 10-325 MG tablet Commonly  known as: PERCOCET Take 1 tablet by mouth in the morning, at noon, in the evening, and at bedtime.   predniSONE 10 MG tablet Commonly known as: DELTASONE Takes 6 tablets for 1 days, then 5 tablets for 1 days, then 4 tablets for 1 days, then 3 tablets for 1 days, then 2 tabs for 1  days, then 1 tab for 1 days, and then stop. What changed:  medication strength how much to take how to take this when to take this additional instructions   propranolol ER 60 MG 24 hr capsule Commonly known as: Inderal LA Take 1 capsule (60 mg total) by mouth daily.   Ubrelvy 100 MG Tabs Generic drug: Ubrogepant Take 100 mg by mouth as needed (for migraine and may repeat ONCE in 2 hours, if no relief- max dosage of 200 mg/24 hours).   Ventolin HFA 108 (90 Base) MCG/ACT inhaler Generic drug: albuterol Inhale 2 puffs into the lungs See admin instructions. Inhale 2 puffs into the lungs every 4-6 hours   albuterol (2.5 MG/3ML) 0.083% nebulizer solution Commonly known as: PROVENTIL Take 2.5 mg by nebulization every 3 (three) hours as needed for wheezing or shortness of breath.   Vitamin D (Ergocalciferol) 1.25 MG (50000 UNIT) Caps capsule Commonly known as: DRISDOL Take 50,000 Units by mouth every Wednesday.        Allergies  Allergen Reactions   Lisinopril Swelling and Other (See Comments)    Hands and feet became swollen    Consultations: None   Procedures/Studies: ECHOCARDIOGRAM COMPLETE BUBBLE STUDY Result Date: 04/27/2023    ECHOCARDIOGRAM REPORT   Patient Name:   [name] Date of Exam: 04/27/2023 Medical Rec #:  [id number]    Height:       65.0 in Accession #:    [id number]   Weight:       186.9 lb Date of Birth:  08/07/1970     BSA:          1.922 m Patient Age:    52 years     BP:           148/95 mmHg Patient Gender: F            HR:           101 bpm. Exam Location:  Inpatient Procedure: 2D Echo, Cardiac Doppler, Color Doppler and Saline Contrast Bubble            Study Indications:    HTN  History:        Patient has no prior history of Echocardiogram examinations.                 CHF, Stroke; Risk Factors:Hypertension.  Sonographer:    Valerie Haney RDCS Referring Phys: [phone] Valerie Haney IMPRESSIONS  1. Left ventricular ejection fraction, by estimation, is 60 to  65%. The left ventricle has normal function. The left ventricle has no regional wall motion abnormalities. There is moderate concentric left ventricular hypertrophy. Left ventricular diastolic parameters were normal.  2. Right ventricular systolic function is normal. The right ventricular size is normal.  3. The mitral valve is normal in structure. No evidence of mitral valve regurgitation. No evidence of mitral stenosis.  4. The aortic valve is normal in structure. Aortic valve regurgitation is not visualized. No aortic stenosis is present.  5. Aortic dilatation noted. There is dilatation of the ascending aorta, measuring 40 mm.  6. The inferior vena cava is normal in size with greater than 50% respiratory  variability, suggesting right atrial pressure of 3 mmHg.  7. Not well visualized. FINDINGS  Left Ventricle: Left ventricular ejection fraction, by estimation, is 60 to 65%. The left ventricle has normal function. The left ventricle has no regional wall motion abnormalities. The left ventricular internal cavity size was normal in size. There is  moderate concentric left ventricular hypertrophy. Left ventricular diastolic parameters were normal. Right Ventricle: The right ventricular size is normal. No increase in right ventricular wall thickness. Right ventricular systolic function is normal. Left Atrium: Left atrial size was normal in size. Right Atrium: Right atrial size was normal in size. Pericardium: There is no evidence of pericardial effusion. Mitral Valve: The mitral valve is normal in structure. No evidence of mitral valve regurgitation. No evidence of mitral valve stenosis. Tricuspid Valve: The tricuspid valve is normal in structure. Tricuspid valve regurgitation is not demonstrated. No evidence of tricuspid stenosis. Aortic Valve: The aortic valve is normal in structure. Aortic valve regurgitation is not visualized. No aortic stenosis is present. Aortic valve mean gradient measures 2.0 mmHg. Aortic  valve peak gradient measures 4.2 mmHg. Pulmonic Valve: The pulmonic valve was normal in structure. Pulmonic valve regurgitation is not visualized. No evidence of pulmonic stenosis. Aorta: The aortic root is normal in size and structure, aortic dilatation noted and the ascending aorta was not well visualized. There is dilatation of the ascending aorta, measuring 40 mm. Venous: The inferior vena cava is normal in size with greater than 50% respiratory variability, suggesting right atrial pressure of 3 mmHg. IAS/Shunts: No atrial level shunt detected by color flow Doppler. Agitated saline contrast was given intravenously to evaluate for intracardiac shunting. Not well visualized.  LEFT VENTRICLE PLAX 2D LVIDd:         4.20 cm   Diastology LVIDs:         2.50 cm   LV e' lateral:   6.85 cm/s LV PW:         1.30 cm   LV E/e' lateral: 11.9 LV IVS:        1.30 cm LVOT diam:     2.00 cm LVOT Area:     3.14 cm  RIGHT VENTRICLE             IVC RV S prime:     20.30 cm/s  IVC diam: 1.60 cm TAPSE (M-mode): 2.0 cm LEFT ATRIUM             Index LA diam:        3.50 cm 1.82 cm/m LA Vol (A2C):   27.8 ml 14.46 ml/m LA Vol (A4C):   48.9 ml 25.44 ml/m LA Biplane Vol: 38.9 ml 20.24 ml/m  AORTIC VALVE AV Vmax:      102.00 cm/s AV Vmean:     64.800 cm/s AV VTI:       0.190 m AV Peak Grad: 4.2 mmHg AV Mean Grad: 2.0 mmHg  AORTA Ao Root diam: 3.00 cm Ao Asc diam:  4.00 cm MITRAL VALVE MV Area (PHT): 3.89 cm     SHUNTS MV Decel Time: 195 msec     Systemic Diam: 2.00 cm MV E velocity: 81.70 cm/s MV A velocity: 129.00 cm/s MV E/A ratio:  0.63 Aditya Sabharwal Electronically signed by Dorthula Nettles Signature Date/Time: 04/27/2023/1:21:51 PM    Final    CT Angio Chest Pulmonary Embolism (PE) W or WO Contrast Result Date: 04/27/2023 CLINICAL DATA:  Chest pain.  Shortness of breath. EXAM: CT ANGIOGRAPHY CHEST WITH CONTRAST TECHNIQUE: Multidetector CT imaging  of the chest was performed using the standard protocol during bolus  administration of intravenous contrast. Multiplanar CT image reconstructions and MIPs were obtained to evaluate the vascular anatomy. RADIATION DOSE REDUCTION: This exam was performed according to the departmental dose-optimization program which includes automated exposure control, adjustment of the mA and/or kV according to patient size and/or use of iterative reconstruction technique. CONTRAST:  75mL OMNIPAQUE IOHEXOL 350 MG/ML SOLN COMPARISON:  Aug 17, 2022. FINDINGS: Cardiovascular: There is no evidence of large central pulmonary embolus seen in the main pulmonary artery or main portions of the left and right pulmonary arteries. However, lower lobe branches of both pulmonary arteries are not well visualized due to respiratory motion artifact, and therefore smaller and more peripheral pulmonary emboli cannot be excluded on the basis of this exam. Normal cardiac size. No pericardial effusion. Mediastinum/Nodes: No enlarged mediastinal, hilar, or axillary lymph nodes. Thyroid gland, trachea, and esophagus demonstrate no significant findings. Lungs/Pleura: Lungs are clear. No pleural effusion or pneumothorax. Upper Abdomen: No acute abnormality. Musculoskeletal: No chest wall abnormality. No acute or significant osseous findings. Review of the MIP images confirms the above findings. IMPRESSION: There is no definite evidence of large central pulmonary embolus seen in the main pulmonary artery or main portions of the left and right pulmonary arteries. However, lower lobe branches of both pulmonary arteries are not well visualized due to respiratory motion artifact, and therefore smaller and more peripheral pulmonary emboli cannot be excluded on the basis of this exam. Electronically Signed   By: Lupita Raider M.D.   On: 04/27/2023 12:43   DG Chest 2 View Result Date: 04/25/2023 CLINICAL DATA:  Shortness of breath. Persistent shortness of breath after recent flu diagnosis. EXAM: CHEST - 2 VIEW COMPARISON:   04/19/2023 FINDINGS: The cardiomediastinal contours are stable. Unchanged aortic tortuosity. Pulmonary vasculature is normal. No consolidation, pleural effusion, or pneumothorax. No acute osseous abnormalities are seen. IMPRESSION: Stable radiographic appearance of the chest.  No acute findings. Electronically Signed   By: Narda Rutherford M.D.   On: 04/25/2023 23:44   DG Chest 2 View Result Date: 04/19/2023 CLINICAL DATA:  Cough.  Body aches.  Chills. EXAM: CHEST - 2 VIEW COMPARISON:  08/16/2022 FINDINGS: Left ventricular prominence. Mildly tortuous aorta. The lungs are clear. The vascularity is normal. No effusions. No abnormal bone finding. IMPRESSION: No active disease. Left ventricular prominence. Mildly tortuous aorta. Electronically Signed   By: Paulina Fusi M.D.   On: 04/19/2023 15:36   (Echo, Carotid, EGD, Colonoscopy, ERCP)    Subjective: No complaints  Discharge Exam: Vitals:   04/27/23 2142 04/28/23 0535  BP: (!) 157/102 (!) 154/100  Pulse: 88 84  Resp: 16 16  Temp: 98.2 F (36.8 C) 98.4 F (36.9 C)  SpO2: 100% 99%   Vitals:   04/27/23 1613 04/27/23 1629 04/27/23 2142 04/28/23 0535  BP: (!) 132/102  (!) 157/102 (!) 154/100  Pulse: 98  88 84  Resp:   16 16  Temp: 98.3 F (36.8 C)  98.2 F (36.8 C) 98.4 F (36.9 C)  TempSrc: Oral  Oral Oral  SpO2: 95% 97% 100% 99%  Weight:      Height:        General: Pt is alert, awake, not in acute distress Cardiovascular: RRR, S1/S2 +, no rubs, no gallops Respiratory: CTA bilaterally, no wheezing, no rhonchi Abdominal: Soft, NT, ND, bowel sounds + Extremities: no edema, no cyanosis    The results of significant diagnostics from this hospitalization (including imaging,  microbiology, ancillary and laboratory) are listed below for reference.     Microbiology: Recent Results (from the past 240 hours)  Resp panel by RT-PCR (RSV, Flu A&B, Covid) Anterior Nasal Swab     Status: Abnormal   Collection Time: 04/19/23  1:54 PM    Specimen: Anterior Nasal Swab  Result Value Ref Range Status   SARS Coronavirus 2 by RT PCR NEGATIVE NEGATIVE Final    Comment: (NOTE) SARS-CoV-2 target nucleic acids are NOT DETECTED.  The SARS-CoV-2 RNA is generally detectable in upper respiratory specimens during the acute phase of infection. The lowest concentration of SARS-CoV-2 viral copies this assay can detect is 138 copies/mL. A negative result does not preclude SARS-Cov-2 infection and should not be used as the sole basis for treatment or other patient management decisions. A negative result may occur with  improper specimen collection/handling, submission of specimen other than nasopharyngeal swab, presence of viral mutation(s) within the areas targeted by this assay, and inadequate number of viral copies(<138 copies/mL). A negative result must be combined with clinical observations, patient history, and epidemiological information. The expected result is Negative.  Fact Sheet for Patients:  BloggerCourse.com  Fact Sheet for Healthcare Providers:  SeriousBroker.it  This test is no t yet approved or cleared by the [place] FDA and  has been authorized for detection and/or diagnosis of SARS-CoV-2 by FDA under an Emergency Use Authorization (EUA). This EUA will remain  in effect (meaning this test can be used) for the duration of the COVID-19 declaration under Section 564(b)(1) of the Act, 21 U.S.C.section 360bbb-3(b)(1), unless the authorization is terminated  or revoked sooner.       Influenza A by PCR POSITIVE (A) NEGATIVE Final   Influenza B by PCR NEGATIVE NEGATIVE Final    Comment: (NOTE) The Xpert Xpress SARS-CoV-2/FLU/RSV plus assay is intended as an aid in the diagnosis of influenza from Nasopharyngeal swab specimens and should not be used as a sole basis for treatment. Nasal washings and aspirates are unacceptable for Xpert Xpress  SARS-CoV-2/FLU/RSV testing.  Fact Sheet for Patients: BloggerCourse.com  Fact Sheet for Healthcare Providers: SeriousBroker.it  This test is not yet approved or cleared by the [place] FDA and has been authorized for detection and/or diagnosis of SARS-CoV-2 by FDA under an Emergency Use Authorization (EUA). This EUA will remain in effect (meaning this test can be used) for the duration of the COVID-19 declaration under Section 564(b)(1) of the Act, 21 U.S.C. section 360bbb-3(b)(1), unless the authorization is terminated or revoked.     Resp Syncytial Virus by PCR NEGATIVE NEGATIVE Final    Comment: (NOTE) Fact Sheet for Patients: BloggerCourse.com  Fact Sheet for Healthcare Providers: SeriousBroker.it  This test is not yet approved or cleared by the [place] FDA and has been authorized for detection and/or diagnosis of SARS-CoV-2 by FDA under an Emergency Use Authorization (EUA). This EUA will remain in effect (meaning this test can be used) for the duration of the COVID-19 declaration under Section 564(b)(1) of the Act, 21 U.S.C. section 360bbb-3(b)(1), unless the authorization is terminated or revoked.  Performed at [facility], [address] [place], [place] [address]      Labs: BNP (last 3 results) Recent Labs    08/16/22 2324 04/26/23 0959 04/27/23 0504  BNP 61.5 30.6 94.5   Basic Metabolic Panel: Recent Labs  Lab 04/26/23 0439 04/26/23 2154 04/27/23 0504  NA 135  --  136  K 2.9*  --  3.8  CL  100  --  105  CO2 24  --  19*  GLUCOSE 107*  --  155*  BUN 11  --  9  CREATININE 0.75  --  0.67  CALCIUM 9.0  --  9.0  MG  --   --  2.3  PHOS  --  2.7  --    Liver Function Tests: Recent Labs  Lab 04/26/23 2154 04/27/23 0504  AST 19 70*  ALT 16 81*  ALKPHOS 39 60  BILITOT 0.7 0.7  PROT 6.0* 6.9  ALBUMIN 3.0* 3.8   No results  for input(s): "LIPASE", "AMYLASE" in the last 168 hours. No results for input(s): "AMMONIA" in the last 168 hours. CBC: Recent Labs  Lab 04/26/23 0439 04/27/23 0504  WBC 7.1 8.5  HGB 13.2 11.8*  HCT 38.2 35.5*  MCV 102.1* 104.7*  PLT 349 338   Cardiac Enzymes: No results for input(s): "CKTOTAL", "CKMB", "CKMBINDEX", "TROPONINI" in the last 168 hours. BNP: Invalid input(s): "POCBNP" CBG: No results for input(s): "GLUCAP" in the last 168 hours. D-Dimer Recent Labs    04/26/23 2154  DDIMER 0.97*   Hgb A1c No results for input(s): "HGBA1C" in the last 72 hours. Lipid Profile No results for input(s): "CHOL", "HDL", "LDLCALC", "TRIG", "CHOLHDL", "LDLDIRECT" in the last 72 hours. Thyroid function studies No results for input(s): "TSH", "T4TOTAL", "T3FREE", "THYROIDAB" in the last 72 hours.  Invalid input(s): "FREET3" Anemia work up Recent Labs    04/26/23 2154  VITAMINB12 768  FOLATE 25.7   Urinalysis    Component Value Date/Time   COLORURINE STRAW (A) 08/17/2022 0003   APPEARANCEUR CLEAR 08/17/2022 0003   LABSPEC 1.010 08/17/2022 0003   PHURINE 6.0 08/17/2022 0003   GLUCOSEU NEGATIVE 08/17/2022 0003   HGBUR NEGATIVE 08/17/2022 0003   BILIRUBINUR NEGATIVE 08/17/2022 0003   KETONESUR NEGATIVE 08/17/2022 0003   PROTEINUR NEGATIVE 08/17/2022 0003   UROBILINOGEN 0.2 10/23/2013 1003   NITRITE NEGATIVE 08/17/2022 0003   LEUKOCYTESUR NEGATIVE 08/17/2022 0003   Sepsis Labs Recent Labs  Lab 04/26/23 0439 04/27/23 0504  WBC 7.1 8.5   Microbiology Recent Results (from the past 240 hours)  Resp panel by RT-PCR (RSV, Flu A&B, Covid) Anterior Nasal Swab     Status: Abnormal   Collection Time: 04/19/23  1:54 PM   Specimen: Anterior Nasal Swab  Result Value Ref Range Status   SARS Coronavirus 2 by RT PCR NEGATIVE NEGATIVE Final    Comment: (NOTE) SARS-CoV-2 target nucleic acids are NOT DETECTED.  The SARS-CoV-2 RNA is generally detectable in upper  respiratory specimens during the acute phase of infection. The lowest concentration of SARS-CoV-2 viral copies this assay can detect is 138 copies/mL. A negative result does not preclude SARS-Cov-2 infection and should not be used as the sole basis for treatment or other patient management decisions. A negative result may occur with  improper specimen collection/handling, submission of specimen other than nasopharyngeal swab, presence of viral mutation(s) within the areas targeted by this assay, and inadequate number of viral copies(<138 copies/mL). A negative result must be combined with clinical observations, patient history, and epidemiological information. The expected result is Negative.  Fact Sheet for Patients:  BloggerCourse.com  Fact Sheet for Healthcare Providers:  SeriousBroker.it  This test is no t yet approved or cleared by the [place] FDA and  has been authorized for detection and/or diagnosis of SARS-CoV-2 by FDA under an Emergency Use Authorization (EUA). This EUA will remain  in effect (meaning this test can be used) for  the duration of the COVID-19 declaration under Section 564(b)(1) of the Act, 21 U.S.C.section 360bbb-3(b)(1), unless the authorization is terminated  or revoked sooner.       Influenza A by PCR POSITIVE (A) NEGATIVE Final   Influenza B by PCR NEGATIVE NEGATIVE Final    Comment: (NOTE) The Xpert Xpress SARS-CoV-2/FLU/RSV plus assay is intended as an aid in the diagnosis of influenza from Nasopharyngeal swab specimens and should not be used as a sole basis for treatment. Nasal washings and aspirates are unacceptable for Xpert Xpress SARS-CoV-2/FLU/RSV testing.  Fact Sheet for Patients: BloggerCourse.com  Fact Sheet for Healthcare Providers: SeriousBroker.it  This test is not yet approved or cleared by the [place] FDA and has been  authorized for detection and/or diagnosis of SARS-CoV-2 by FDA under an Emergency Use Authorization (EUA). This EUA will remain in effect (meaning this test can be used) for the duration of the COVID-19 declaration under Section 564(b)(1) of the Act, 21 U.S.C. section 360bbb-3(b)(1), unless the authorization is terminated or revoked.     Resp Syncytial Virus by PCR NEGATIVE NEGATIVE Final    Comment: (NOTE) Fact Sheet for Patients: BloggerCourse.com  Fact Sheet for Healthcare Providers: SeriousBroker.it  This test is not yet approved or cleared by the [place] FDA and has been authorized for detection and/or diagnosis of SARS-CoV-2 by FDA under an Emergency Use Authorization (EUA). This EUA will remain in effect (meaning this test can be used) for the duration of the COVID-19 declaration under Section 564(b)(1) of the Act, 21 U.S.C. section 360bbb-3(b)(1), unless the authorization is terminated or revoked.  Performed at [facility], [address] [place], [place] [address]      Time coordinating discharge: Over 35 minutes  SIGNED:   Marinda Elk, MD  [facility] 04/28/2023, 8:31 AM Pager   If 7PM-7AM, please contact night-coverage [email or web address] Password [email or web address]

## 2023-04-28 NOTE — Progress Notes (Signed)
SATURATION QUALIFICATIONS: (This note is used to comply with regulatory documentation for home oxygen)  Patient Saturations on Room Air at Rest = 95%  Patient Saturations on Room Air while Ambulating = 80%  Patient Saturations on 2 Liters of oxygen while Ambulating = 100%  Please briefly explain why patient needs home oxygen: Patient's O2 on room air dropped to 80% while ambulating, and the patient had a hard time breathing. When she ambulated with the oxygen, she felt much better and was able to go farther.

## 2023-05-14 ENCOUNTER — Emergency Department (HOSPITAL_BASED_OUTPATIENT_CLINIC_OR_DEPARTMENT_OTHER)
Admission: EM | Admit: 2023-05-14 | Discharge: 2023-05-15 | Disposition: A | Discharge status: Home / Self Care | Payer: MEDICAID | Attending: Emergency Medicine | Admitting: Emergency Medicine

## 2023-05-14 ENCOUNTER — Encounter (HOSPITAL_BASED_OUTPATIENT_CLINIC_OR_DEPARTMENT_OTHER): Payer: Self-pay | Admitting: Emergency Medicine

## 2023-05-14 ENCOUNTER — Emergency Department (HOSPITAL_BASED_OUTPATIENT_CLINIC_OR_DEPARTMENT_OTHER): Payer: MEDICAID

## 2023-05-14 ENCOUNTER — Other Ambulatory Visit: Payer: Self-pay

## 2023-05-14 DIAGNOSIS — Z20822 Contact with and (suspected) exposure to covid-19: Secondary | ICD-10-CM | POA: Insufficient documentation

## 2023-05-14 DIAGNOSIS — I110 Hypertensive heart disease with heart failure: Secondary | ICD-10-CM | POA: Insufficient documentation

## 2023-05-14 DIAGNOSIS — R072 Precordial pain: Secondary | ICD-10-CM | POA: Insufficient documentation

## 2023-05-14 DIAGNOSIS — R600 Localized edema: Secondary | ICD-10-CM | POA: Diagnosis not present

## 2023-05-14 DIAGNOSIS — J45909 Unspecified asthma, uncomplicated: Secondary | ICD-10-CM | POA: Insufficient documentation

## 2023-05-14 DIAGNOSIS — I509 Heart failure, unspecified: Secondary | ICD-10-CM | POA: Diagnosis not present

## 2023-05-14 DIAGNOSIS — E876 Hypokalemia: Secondary | ICD-10-CM | POA: Diagnosis not present

## 2023-05-14 LAB — RESP PANEL BY RT-PCR (RSV, FLU A&B, COVID)  RVPGX2
Influenza A by PCR: NEGATIVE
Influenza B by PCR: NEGATIVE
Resp Syncytial Virus by PCR: NEGATIVE
SARS Coronavirus 2 by RT PCR: NEGATIVE

## 2023-05-14 LAB — BRAIN NATRIURETIC PEPTIDE: B Natriuretic Peptide: 50.2 pg/mL (ref 0.0–100.0)

## 2023-05-14 LAB — BASIC METABOLIC PANEL
Anion gap: 9 (ref 5–15)
BUN: 10 mg/dL (ref 6–20)
CO2: 26 mmol/L (ref 22–32)
Calcium: 8.7 mg/dL — ABNORMAL LOW (ref 8.9–10.3)
Chloride: 102 mmol/L (ref 98–111)
Creatinine, Ser: 1.16 mg/dL — ABNORMAL HIGH (ref 0.44–1.00)
GFR, Estimated: 56 mL/min — ABNORMAL LOW (ref >60)
Glucose, Bld: 97 mg/dL (ref 70–99)
Potassium: 3.2 mmol/L — ABNORMAL LOW (ref 3.5–5.1)
Sodium: 137 mmol/L (ref 135–145)

## 2023-05-14 LAB — CBC
HCT: 34.3 % — ABNORMAL LOW (ref 36.0–46.0)
Hemoglobin: 11.6 g/dL — ABNORMAL LOW (ref 12.0–15.0)
MCH: 34.6 pg — ABNORMAL HIGH (ref 26.0–34.0)
MCHC: 33.8 g/dL (ref 30.0–36.0)
MCV: 102.4 fL — ABNORMAL HIGH (ref 80.0–100.0)
Platelets: 275 10*3/uL (ref 150–400)
RBC: 3.35 MIL/uL — ABNORMAL LOW (ref 3.87–5.11)
RDW: 13.5 % (ref 11.5–15.5)
WBC: 6.7 10*3/uL (ref 4.0–10.5)
nRBC: 0 % (ref 0.0–0.2)

## 2023-05-14 LAB — D-DIMER, QUANTITATIVE: D-Dimer, Quant: 0.51 ug{FEU}/mL — ABNORMAL HIGH (ref 0.00–0.50)

## 2023-05-14 LAB — TROPONIN I (HIGH SENSITIVITY): Troponin I (High Sensitivity): 6 ng/L (ref <18)

## 2023-05-14 MED ORDER — POTASSIUM CHLORIDE CRYS ER 20 MEQ PO TBCR
40.0000 meq | EXTENDED_RELEASE_TABLET | Freq: Once | ORAL | Status: AC
  Administered 2023-05-14: 40 meq via ORAL
  Filled 2023-05-14: qty 2

## 2023-05-14 MED ORDER — SODIUM CHLORIDE 0.9 % IV BOLUS: 1000.0000 mL | Freq: Once | INTRAVENOUS | Status: DC

## 2023-05-14 NOTE — ED Triage Notes (Signed)
 Pt c/o sharp CP, SHOB and LT ankle swelling that all started today
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
## 2023-05-15 NOTE — ED Notes (Addendum)
 RN reviewed discharge/follow up instructions w/ pt.  She then called her daughter and requested that RN again reviewed discharge/follow up instructions.  Pt did not understand why her legs "keep swelling and don't go down."  Pt was concerned that she was going to go home and "not wake up." RN reviewed labs which indicated that she has been medically cleared.  Pt stated she was going to go home and take a hot bath.  RN explained that hot baths could also increase swelling.  Pt thanked Charity fundraiser.
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
## 2023-05-16 NOTE — ED Notes (Signed)
 AVS reviewed and handed to pt Rx times one reviewed and handed patient  Patient agreed to follow up as directed. Reasons for return discussed.  Patient stated understanding.  Patient dressed without assistance.  Patient ambulatory with steady even gait unassisted out of ED.   All questions were answered.    Lauraine Almarie Collet, RN 05/16/23 2230

## 2023-05-16 NOTE — ED Triage Notes (Signed)
 Pt arrives for complaints of shortness of breath with swelling to bilateral lower legs. PT also complaints of mild chest pain over the past few days. PT does report a cough with no production, history of asthma. SPO2 100% room air, able to speak full sentences at this time.

## 2023-06-25 ENCOUNTER — Encounter (HOSPITAL_BASED_OUTPATIENT_CLINIC_OR_DEPARTMENT_OTHER): Payer: Self-pay | Admitting: Emergency Medicine

## 2023-06-25 ENCOUNTER — Emergency Department (HOSPITAL_BASED_OUTPATIENT_CLINIC_OR_DEPARTMENT_OTHER)
Admission: EM | Admit: 2023-06-25 | Discharge: 2023-06-26 | Disposition: A | Discharge status: Home / Self Care | Payer: MEDICAID | Attending: Emergency Medicine | Admitting: Emergency Medicine

## 2023-06-25 ENCOUNTER — Emergency Department (HOSPITAL_BASED_OUTPATIENT_CLINIC_OR_DEPARTMENT_OTHER): Payer: MEDICAID

## 2023-06-25 DIAGNOSIS — Z7951 Long term (current) use of inhaled steroids: Secondary | ICD-10-CM | POA: Insufficient documentation

## 2023-06-25 DIAGNOSIS — R079 Chest pain, unspecified: Secondary | ICD-10-CM | POA: Diagnosis present

## 2023-06-25 DIAGNOSIS — I509 Heart failure, unspecified: Secondary | ICD-10-CM | POA: Insufficient documentation

## 2023-06-25 DIAGNOSIS — J45909 Unspecified asthma, uncomplicated: Secondary | ICD-10-CM | POA: Diagnosis not present

## 2023-06-25 DIAGNOSIS — I10 Essential (primary) hypertension: Secondary | ICD-10-CM

## 2023-06-25 DIAGNOSIS — I110 Hypertensive heart disease with heart failure: Secondary | ICD-10-CM | POA: Insufficient documentation

## 2023-06-25 DIAGNOSIS — E785 Hyperlipidemia, unspecified: Secondary | ICD-10-CM | POA: Insufficient documentation

## 2023-06-25 DIAGNOSIS — Z79899 Other long term (current) drug therapy: Secondary | ICD-10-CM | POA: Diagnosis not present

## 2023-06-25 LAB — CBC WITH DIFFERENTIAL/PLATELET
Abs Immature Granulocytes: 0.01 10*3/uL (ref 0.00–0.07)
Basophils Absolute: 0 10*3/uL (ref 0.0–0.1)
Basophils Relative: 0 %
Eosinophils Absolute: 0.1 10*3/uL (ref 0.0–0.5)
Eosinophils Relative: 1 %
HCT: 36.4 % (ref 36.0–46.0)
Hemoglobin: 12.3 g/dL (ref 12.0–15.0)
Immature Granulocytes: 0 %
Lymphocytes Relative: 46 %
Lymphs Abs: 1.8 10*3/uL (ref 0.7–4.0)
MCH: 33.8 pg (ref 26.0–34.0)
MCHC: 33.8 g/dL (ref 30.0–36.0)
MCV: 100 fL (ref 80.0–100.0)
Monocytes Absolute: 0.3 10*3/uL (ref 0.1–1.0)
Monocytes Relative: 8 %
Neutro Abs: 1.8 10*3/uL (ref 1.7–7.7)
Neutrophils Relative %: 45 %
Platelets: 336 10*3/uL (ref 150–400)
RBC: 3.64 MIL/uL — ABNORMAL LOW (ref 3.87–5.11)
RDW: 13.2 % (ref 11.5–15.5)
WBC: 4 10*3/uL (ref 4.0–10.5)
nRBC: 0 % (ref 0.0–0.2)

## 2023-06-25 LAB — BASIC METABOLIC PANEL WITH GFR
Anion gap: 10 (ref 5–15)
BUN: 8 mg/dL (ref 6–20)
CO2: 24 mmol/L (ref 22–32)
Calcium: 9.5 mg/dL (ref 8.9–10.3)
Chloride: 107 mmol/L (ref 98–111)
Creatinine, Ser: 1.04 mg/dL — ABNORMAL HIGH (ref 0.44–1.00)
GFR, Estimated: >60 mL/min (ref >60)
Glucose, Bld: 82 mg/dL (ref 70–99)
Potassium: 3.7 mmol/L (ref 3.5–5.1)
Sodium: 141 mmol/L (ref 135–145)

## 2023-06-25 LAB — TROPONIN I (HIGH SENSITIVITY): Troponin I (High Sensitivity): 5 ng/L (ref <18)

## 2023-06-25 NOTE — ED Provider Notes (Signed)
 [place] [facility] Provider Note   CSN: [id number] Arrival date & time: 06/25/23  2102     History  Chief Complaint  Patient presents with   Chest Pain    Valerie Haney is a 53 y.o. female.   Chest Pain  Patient is a 53 year old female presents ED today complaining of a 5-hour history of intermittent chest pain that began suddenly when she bent over to pick up a box and stood up.  Reports that she felt like she had an elephant "standing on her chest."  Reports that this lasted for a couple of seconds and then went away.  But has been intermittently getting these pains that last a few seconds in the left side of her chest.  Previous medical history of CHF, cardiac arrhythmia, stroke, hypertension, seizure, migraine, asthma.  Denies shortness of breath, fever, cough, congestion, trauma, abdominal pain, nausea, vomiting, dysuria, lower leg swelling.     Home Medications Prior to Admission medications   Medication Sig Start Date End Date Taking? Authorizing Provider  albuterol (PROVENTIL) (2.5 MG/3ML) 0.083% nebulizer solution Take 2.5 mg by nebulization every 3 (three) hours as needed for wheezing or shortness of breath.    [provider]  albuterol (VENTOLIN HFA) 108 (90 Base) MCG/ACT inhaler Inhale 2 puffs into the lungs See admin instructions. Inhale 2 puffs into the lungs every 4-6 hours    [provider]  ALPRAZolam (XANAX) 1 MG tablet Take 1 mg by mouth at bedtime. 11/04/20   [provider]  amLODipine (NORVASC) 10 MG tablet Take 1 tablet (10 mg total) by mouth daily. 04/28/23 04/27/24  Marinda Elk, MD  atorvastatin (LIPITOR) 40 MG tablet Take 40 mg by mouth daily. 05/31/16   [provider]  azithromycin (ZITHROMAX) 250 MG tablet Take 1 tablet daily 04/28/23   Marinda Elk, MD  budesonide-formoterol [facility]) 160-4.5 MCG/ACT inhaler Inhale 2 puffs into the lungs in the morning and at  bedtime. 10/29/21   Almon Hercules, MD  LICART 1.3 % PT24 Apply 1 patch topically daily as needed (for pain).    [provider]  lisinopril-hydrochlorothiazide (ZESTORETIC) 20-25 MG tablet Take 1 tablet by mouth daily.    [provider]  NARCAN 4 MG/0.1ML LIQD nasal spray kit Place 1 spray into the nose once as needed (as needed/as directed).    [provider]  NOREL AD 4-10-325 MG TABS Take 1 tablet by mouth at bedtime.    [provider]  oxyCODONE-acetaminophen (PERCOCET) 10-325 MG tablet Take 1 tablet by mouth in the morning, at noon, in the evening, and at bedtime.    [provider]  predniSONE (DELTASONE) 10 MG tablet Takes 6 tablets for 1 days, then 5 tablets for 1 days, then 4 tablets for 1 days, then 3 tablets for 1 days, then 2 tabs for 1 days, then 1 tab for 1 days, and then stop. 04/28/23   Marinda Elk, MD  propranolol ER (INDERAL LA) 60 MG 24 hr capsule Take 1 capsule (60 mg total) by mouth daily. Patient not taking: Reported on 04/27/2023 11/26/21 04/27/23  Windell Norfolk, MD  Ubrogepant (UBRELVY) 100 MG TABS Take 100 mg by mouth as needed (for migraine and may repeat ONCE in 2 hours, if no relief- max dosage of 200 mg/24 hours). 10/07/20   [provider]  Vitamin D, Ergocalciferol, (DRISDOL) 1.25 MG (50000 UNIT) CAPS capsule Take 50,000 Units by mouth every Wednesday.  [provider]      Allergies    Lisinopril    Review of Systems   Review of Systems  Cardiovascular:  Positive for chest pain.  All other systems reviewed and are negative.   Physical Exam Updated Vital Signs BP (!) 164/123   Pulse (!) 101   Temp 98.9 F (37.2 C)   Resp (!) 21   Ht 5\' 5"  (1.651 m)   Wt 84.8 kg   SpO2 99%   BMI 31.12 kg/m  Physical Exam Vitals and nursing note reviewed.  Constitutional:      General: She is not in acute distress.    Appearance: Normal appearance. She is not ill-appearing.  HENT:     Head:  Normocephalic and atraumatic.  Eyes:     General: No scleral icterus.       Right eye: No discharge.        Left eye: No discharge.     Extraocular Movements: Extraocular movements intact.     Conjunctiva/sclera: Conjunctivae normal.  Cardiovascular:     Rate and Rhythm: Normal rate and regular rhythm.     Pulses: Normal pulses.     Heart sounds: Normal heart sounds. No murmur heard.    No friction rub. No gallop.  Pulmonary:     Effort: Pulmonary effort is normal. No respiratory distress.     Breath sounds: Normal breath sounds. No stridor. No wheezing, rhonchi or rales.  Chest:     Chest wall: Tenderness (Left-sided chest wall tenderness noted to palpation) present.  Abdominal:     General: Abdomen is flat. There is no distension.     Palpations: Abdomen is soft.     Tenderness: There is no abdominal tenderness. There is no right CVA tenderness, left CVA tenderness or guarding.  Musculoskeletal:        General: No tenderness or signs of injury.     Right lower leg: No edema.     Left lower leg: No edema.  Skin:    General: Skin is warm and dry.     Coloration: Skin is not pale.     Findings: No bruising, erythema or lesion.  Neurological:     General: No focal deficit present.     Mental Status: She is alert and oriented to person, place, and time. Mental status is at baseline.     Sensory: No sensory deficit.     Motor: No weakness.     Coordination: Coordination normal.     Gait: Gait normal.  Psychiatric:        Mood and Affect: Mood normal.     ED Results / Procedures / Treatments   Labs (all labs ordered are listed, but only abnormal results are displayed) Labs Reviewed  BASIC METABOLIC PANEL WITH GFR - Abnormal; Notable for the following components:      Result Value   Creatinine, Ser 1.04 (*)    All other components within normal limits  CBC WITH DIFFERENTIAL/PLATELET - Abnormal; Notable for the following components:   RBC 3.64 (*)    All other components  within normal limits  TROPONIN I (HIGH SENSITIVITY)  TROPONIN I (HIGH SENSITIVITY)    EKG None  Radiology DG Chest 2 View Result Date: 06/25/2023 CLINICAL DATA:  Chest pain. EXAM: CHEST - 2 VIEW COMPARISON:  05/14/2023 FINDINGS: The cardiomediastinal contours are normal. The lungs are clear. Pulmonary vasculature is normal. No consolidation, pleural effusion, or pneumothorax. No acute osseous abnormalities are seen. IMPRESSION: No active  cardiopulmonary disease. Electronically Signed   By: Narda Rutherford M.D.   On: 06/25/2023 21:39    Procedures Procedures    Medications Ordered in ED Medications - No data to display  ED Course/ Medical Decision Making/ A&P Clinical Course as of 06/26/23 0007  Sat Jun 25, 2023  2355 Troponin I (High Sensitivity) [CB]    Clinical Course User Index [CB] Lunette Stands, PA-C            HEART Score: 4                Geneva (Revised) Score: 5, Geneva Score Interpretation: Moderate Risk Group: ~20-30% incidence of pulmonary embolism from several studies   Medical Decision Making Amount and/or Complexity of Data Reviewed Labs: ordered. Radiology: ordered.   This patient is a 53 year old female who presents to the ED for concern of intermittent chest pain.  That it is not activity dependent.  That started after she raised a box to chest level.  Pain is not pleuritic and is currently not experiencing any pain.  On physical exam, patient is noted to be afebrile, no acute stress, speaking full sentences, nontachycardic, nontachypneic.  LCTAB.  Chest wall tenderness noted to palpation to the left-sided chest that recreated the pain that she states that she had been feeling earlier.  No erythema, swelling.  No lower leg swelling, no abdominal tenderness.  Unremarkable physical exam otherwise.  Initial labs do not show any specific cardiac or pulmonary causes and I have low suspicion for PE at this time as patient had a recent CT done 2 months ago which  was found to be unremarkable and patient had not have any exacerbating factors at this time including no long trips, not on hormone therapy, no cancer, and pain was reproducible when pressing on the chest wall.  Patient did not want to go undergo a CT PE study at this time.  Suspect initial tachycardia noted on EKG was mostly due to anxiety as patient also expressed that she had been feeling anxious.  Troponin has also been unremarkable.  Patient states that she is wishing to go home and monitor symptoms and will return for any new or worsening symptoms with strict return to ED precautions.  Will have her follow-up with cardiology for further evaluation as well.  With a heart score of 4.  Awaiting second Trop and if negative patient go home.  Patient care handed over to Dr. Preston Fleeting.   Differential diagnoses prior to evaluation: The emergent differential diagnosis includes, but is not limited to, muscle wall strain, ACS, PE, AAS, pneumonia, pneumothorax, pleural effusion, Boerhaave's, gastritis, GERD.  This is not an exhaustive differential.   Past Medical History / Co-morbidities / Social History: CVA, cardiac arrhythmia, CHF, asthma, seizure, hypertension, pneumonia  Additional history: Chart reviewed. Pertinent results include:   Admitted for severe persistent asthma in the presence of pneumonia on 04/26/2023 where she was noted to have a CT angio looking for PEs which was negative at the time despite an elevated D-dimer.  Lab Tests/Imaging studies: I personally interpreted labs/imaging and the pertinent results include: CBC shows a mildly decreased RBC of 3.64 but otherwise unremarkable BMP shows a mildly elevated creatinine of 1.04 likely due to dehydration and is lower than previous 1.74-month ago Troponin was unremarkable Chest x-ray was unremarkable I agree with the radiologist interpretation.  Cardiac monitoring: EKG obtained and interpreted by myself and attending physician which shows:  Sinus tachycardia   Medications: The medications were ordered  at this time.  I have reviewed the patients home medicines and have made adjustments as needed.  Disposition: 12:07 AM Care of Valerie Haney transferred to Dr. Preston Fleeting at the end of my shift as the patient will require reassessment once labs/imaging have resulted. Patient presentation, ED course, and plan of care discussed with review of all pertinent labs and imaging. Please see his/her note for further details regarding further ED course and disposition. Plan at time of handoff is await second troponin and if negative patient can go home and follow-up with cardiology as well as PCP, with strict return to ED precautions. This may be altered or completely changed at the discretion of the oncoming team pending results of further workup.     Final Clinical Impression(s) / ED Diagnoses Final diagnoses:  Chest pain, unspecified type  Hyperlipidemia, unspecified hyperlipidemia type  Essential hypertension    Rx / DC Orders ED Discharge Orders          Ordered    Ambulatory referral to Cardiology       Comments: If you have not heard from the Cardiology office within the next 72 hours please call [phone].   06/25/23 2359              Lunette Stands, PA-C 06/26/23 0008    Rolan Bucco, MD 06/26/23 1201

## 2023-06-25 NOTE — Discharge Instructions (Addendum)
 You were seen today for chest pain that I believe is most likely due to the muscles of your chest wall as the pain was able to be reproduced on palpation of your chest.  And your labs and imaging were all very reassuring that I have low suspicion for any emergent pathology present this time however would recommend you follow-up with a cardiologist as with your previous heart history believe that you would benefit from an evaluation from them.  I have sent a referral to them for you to follow-up with and they should be reaching out to you.  Please check your blood pressure once a day and keep a record of it.  Take that record with you whenever you see your primary care provider, or which ever physician is monitoring your blood pressure.  It is very important that your blood pressure be adequately controlled.  Inadequately controlled high blood pressure can lead to heart attacks, strokes, kidney failure.

## 2023-06-25 NOTE — ED Provider Notes (Incomplete)
 Valerie Haney Provider Note   CSN: [id number] Arrival date & time: 06/25/23  2102     History {Add pertinent medical, surgical, social history, OB history to HPI:1} Chief Complaint  Patient presents with  . Chest Pain    Valerie Haney is a 53 y.o. female.   Chest Pain  Patient is a 53 year old female presents ED today complaining of a 5-hour history of intermittent chest pain that began suddenly when she bent over to pick up a box and stood up.  Reports that she felt like she had an elephant "standing on her chest."  Reports that this lasted for a couple of seconds and then went away.  But has been intermittently getting these pains that last a few seconds in the left side of her chest.  Previous medical history of CHF, cardiac arrhythmia, stroke, hypertension, seizure, migraine, asthma.  Denies shortness of breath, fever, cough, congestion, trauma, abdominal pain, nausea, vomiting, dysuria, lower leg swelling.     Home Medications Prior to Admission medications   Medication Sig Start Date End Date Taking? Authorizing Provider  albuterol (PROVENTIL) (2.5 MG/3ML) 0.083% nebulizer solution Take 2.5 mg by nebulization every 3 (three) hours as needed for wheezing or shortness of breath.    [provider]  albuterol (VENTOLIN HFA) 108 (90 Base) MCG/ACT inhaler Inhale 2 puffs into the lungs See admin instructions. Inhale 2 puffs into the lungs every 4-6 hours    [provider]  ALPRAZolam (XANAX) 1 MG tablet Take 1 mg by mouth at bedtime. 11/04/20   [provider]  amLODipine (NORVASC) 10 MG tablet Take 1 tablet (10 mg total) by mouth daily. 04/28/23 04/27/24  Marinda Elk, MD  atorvastatin (LIPITOR) 40 MG tablet Take 40 mg by mouth daily. 05/31/16   [provider]  azithromycin (ZITHROMAX) 250 MG tablet Take 1 tablet daily 04/28/23   Marinda Elk, MD  budesonide-formoterol [facility]) 160-4.5  MCG/ACT inhaler Inhale 2 puffs into the lungs in the morning and at bedtime. 10/29/21   Almon Hercules, MD  LICART 1.3 % PT24 Apply 1 patch topically daily as needed (for pain).    [provider]  lisinopril-hydrochlorothiazide (ZESTORETIC) 20-25 MG tablet Take 1 tablet by mouth daily.    [provider]  NARCAN 4 MG/0.1ML LIQD nasal spray kit Place 1 spray into the nose once as needed (as needed/as directed).    [provider]  NOREL AD 4-10-325 MG TABS Take 1 tablet by mouth at bedtime.    [provider]  oxyCODONE-acetaminophen (PERCOCET) 10-325 MG tablet Take 1 tablet by mouth in the morning, at noon, in the evening, and at bedtime.    [provider]  predniSONE (DELTASONE) 10 MG tablet Takes 6 tablets for 1 days, then 5 tablets for 1 days, then 4 tablets for 1 days, then 3 tablets for 1 days, then 2 tabs for 1 days, then 1 tab for 1 days, and then stop. 04/28/23   Marinda Elk, MD  propranolol ER (INDERAL LA) 60 MG 24 hr capsule Take 1 capsule (60 mg total) by mouth daily. Patient not taking: Reported on 04/27/2023 11/26/21 04/27/23  Windell Norfolk, MD  Ubrogepant (UBRELVY) 100 MG TABS Take 100 mg by mouth as needed (for migraine and may repeat ONCE in 2 hours, if no relief- max dosage of 200 mg/24 hours). 10/07/20   [provider]  Vitamin D, Ergocalciferol, (DRISDOL) 1.25 MG (50000 UNIT) CAPS capsule  Take 50,000 Units by mouth every Wednesday.    [provider]      Allergies    Lisinopril    Review of Systems   Review of Systems  Cardiovascular:  Positive for chest pain.  All other systems reviewed and are negative.   Physical Exam Updated Vital Signs BP (!) 164/123   Pulse (!) 101   Temp 98.9 F (37.2 C)   Resp (!) 21   Ht 5\' 5"  (1.651 m)   Wt 84.8 kg   SpO2 99%   BMI 31.12 kg/m  Physical Exam Vitals and nursing note reviewed.  Constitutional:      General: She is not in acute distress.    Appearance:  Normal appearance. She is not ill-appearing.  HENT:     Head: Normocephalic and atraumatic.  Eyes:     Extraocular Movements: Extraocular movements intact.     Conjunctiva/sclera: Conjunctivae normal.  Cardiovascular:     Rate and Rhythm: Normal rate and regular rhythm.     Pulses: Normal pulses.     Heart sounds: Normal heart sounds. No murmur heard.    No friction rub. No gallop.  Pulmonary:     Effort: Pulmonary effort is normal. No respiratory distress.     Breath sounds: Normal breath sounds.  Abdominal:     General: Abdomen is flat.     Palpations: Abdomen is soft.     Tenderness: There is no abdominal tenderness.  Skin:    General: Skin is warm and dry.  Neurological:     General: No focal deficit present.     Mental Status: She is alert. Mental status is at baseline.  Psychiatric:        Mood and Affect: Mood normal.     ED Results / Procedures / Treatments   Labs (all labs ordered are listed, but only abnormal results are displayed) Labs Reviewed  BASIC METABOLIC PANEL WITH GFR - Abnormal; Notable for the following components:      Result Value   Creatinine, Ser 1.04 (*)    All other components within normal limits  CBC WITH DIFFERENTIAL/PLATELET - Abnormal; Notable for the following components:   RBC 3.64 (*)    All other components within normal limits  TROPONIN I (HIGH SENSITIVITY)  TROPONIN I (HIGH SENSITIVITY)    EKG None  Radiology DG Chest 2 View Result Date: 06/25/2023 CLINICAL DATA:  Chest pain. EXAM: CHEST - 2 VIEW COMPARISON:  05/14/2023 FINDINGS: The cardiomediastinal contours are normal. The lungs are clear. Pulmonary vasculature is normal. No consolidation, pleural effusion, or pneumothorax. No acute osseous abnormalities are seen. IMPRESSION: No active cardiopulmonary disease. Electronically Signed   By: Narda Rutherford M.D.   On: 06/25/2023 21:39    Procedures Procedures    Medications Ordered in ED Medications - No data to  display  ED Course/ Medical Decision Making/ A&P Clinical Course as of 06/25/23 2357  Sat Jun 25, 2023  2355 Troponin I (High Sensitivity) [CB]    Clinical Course User Index [CB] Lunette Stands, PA-C   {   Click here for ABCD2, HEART and other calculatorsREFRESH Note before signing :1}                              Medical Decision Making Amount and/or Complexity of Data Reviewed Labs: ordered. Radiology: ordered.   This patient is a 53 year old female who presents to the ED for concern of intermittent  chest pain.  That it is not activity dependent.  That started after she raised a box to chest level.  Pain is not pleuritic and is currently not experiencing any pain.  On physical exam, patient is noted to be afebrile, no acute stress, speaking full sentences, nontachycardic, nontachypneic.  LCTAB.  Chest wall tenderness noted to palpation to the left-sided chest that recreated the pain that she states that she had been feeling earlier.  No erythema, swelling.  No lower leg swelling, no abdominal tenderness.  Unremarkable physical exam otherwise.  Initial labs do not show any specific cardiac or pulmonary causes and I have low suspicion for PE at this time as patient had a recent CT done 2 months ago which was found to be unremarkable and patient had not have any exacerbating factors at this time including no long trips, not on hormone therapy, no cancer, and pain was reproducible when pressing on the chest wall.  Suspect initial tachycardia noted on EKG was mostly due to anxiety as patient also expressed that she had been feeling anxious.  Troponin has also been unremarkable.  Patient states that she is wishing to go home and monitor symptoms and will return for any new or worsening symptoms with strict return to ED precautions.  Will have her follow-up with cardiology for further evaluation as well.  I believe patient safe to discharge at this time and followed up in outpatient setting.   However with a heart score of 4, recommend she follow-up with cardiology for further evaluation.  Patient's was agreement and understanding of plan.  All questions were answered.  Differential diagnoses prior to evaluation: The emergent differential diagnosis includes, but is not limited to, muscle wall strain, ACS, PE, AAS, pneumonia, pneumothorax, pleural effusion, Boerhaave's, gastritis, GERD.  This is not an exhaustive differential.   Past Medical History / Co-morbidities / Social History: CVA, cardiac arrhythmia, CHF, asthma, seizure, hypertension, pneumonia  Additional history: Chart reviewed. Pertinent results include:   Admitted for severe persistent asthma in the presence of pneumonia on 04/26/2023 where she was noted to have a CT angio looking for PEs which was negative at the time despite an elevated D-dimer.  Lab Tests/Imaging studies: I personally interpreted labs/imaging and the pertinent results include: CBC shows a mildly decreased RBC of 3.64 but otherwise unremarkable BMP shows a mildly elevated creatinine of 1.04 likely due to dehydration and is lower than previous 1.19-month ago Troponin was unremarkable Chest x-ray was unremarkable I agree with the radiologist interpretation.  Cardiac monitoring: EKG obtained and interpreted by myself and attending physician which shows: Sinus tachycardia   Medications: The medications were ordered at this time.  I have reviewed the patients home medicines and have made adjustments as needed.  Disposition: 12:03 AM Care of Sibley Rolison transferred to [facility] *** and Dr. Marland Kitchen at the end of my shift as the patient will require reassessment once labs/imaging have resulted. Patient presentation, ED course, and plan of care discussed with review of all pertinent labs and imaging. Please see his/her note for further details regarding further ED course and disposition. Plan at time of handoff is ***. This may be altered or completely changed at the  discretion of the oncoming team pending results of further workup.     Final Clinical Impression(s) / ED Diagnoses Final diagnoses:  None    Rx / DC Orders ED Discharge Orders     None

## 2023-06-25 NOTE — ED Triage Notes (Signed)
 Pt reports CP and "feels like my heart is about to leave my body" since 1900; pt is anxious and tremors are noted; sts she feels jittery

## 2023-06-25 NOTE — ED Notes (Signed)
Pt hooked up to the monitor with the 5 lead, BP cuff and pulse ox ?

## 2023-06-26 LAB — TROPONIN I (HIGH SENSITIVITY): Troponin I (High Sensitivity): 7 ng/L (ref <18)

## 2023-06-26 NOTE — ED Provider Notes (Signed)
 Care assumed from [facility], patient with chest pain pending repeat troponin.  Repeat troponin is normal.  I am discharging her with outpatient referral to cardiology.  I have also advised her to monitor her blood pressure as an outpatient and make sure that it is being adequately controlled.  Results for orders placed or performed during the hospital encounter of 06/25/23  Troponin I (High Sensitivity)   Collection Time: 06/25/23 12:40 AM  Result Value Ref Range   Troponin I (High Sensitivity) 7 <18 ng/L  Basic metabolic panel   Collection Time: 06/25/23  9:47 PM  Result Value Ref Range   Sodium 141 135 - 145 mmol/L   Potassium 3.7 3.5 - 5.1 mmol/L   Chloride 107 98 - 111 mmol/L   CO2 24 22 - 32 mmol/L   Glucose, Bld 82 70 - 99 mg/dL   BUN 8 6 - 20 mg/dL   Creatinine, Ser 1.61 (H) 0.44 - 1.00 mg/dL   Calcium 9.5 8.9 - 09.6 mg/dL   GFR, Estimated >04 >54 mL/min   Anion gap 10 5 - 15  CBC with Differential   Collection Time: 06/25/23  9:47 PM  Result Value Ref Range   WBC 4.0 4.0 - 10.5 K/uL   RBC 3.64 (L) 3.87 - 5.11 MIL/uL   Hemoglobin 12.3 12.0 - 15.0 g/dL   HCT 09.8 11.9 - 14.7 %   MCV 100.0 80.0 - 100.0 fL   MCH 33.8 26.0 - 34.0 pg   MCHC 33.8 30.0 - 36.0 g/dL   RDW 82.9 56.2 - 13.0 %   Platelets 336 150 - 400 K/uL   nRBC 0.0 0.0 - 0.2 %   Neutrophils Relative % 45 %   Neutro Abs 1.8 1.7 - 7.7 K/uL   Lymphocytes Relative 46 %   Lymphs Abs 1.8 0.7 - 4.0 K/uL   Monocytes Relative 8 %   Monocytes Absolute 0.3 0.1 - 1.0 K/uL   Eosinophils Relative 1 %   Eosinophils Absolute 0.1 0.0 - 0.5 K/uL   Basophils Relative 0 %   Basophils Absolute 0.0 0.0 - 0.1 K/uL   Immature Granulocytes 0 %   Abs Immature Granulocytes 0.01 0.00 - 0.07 K/uL  Troponin I (High Sensitivity)   Collection Time: 06/25/23  9:47 PM  Result Value Ref Range   Troponin I (High Sensitivity) 5 <18 ng/L   DG Chest 2 View Result Date: 06/25/2023 CLINICAL DATA:  Chest pain. EXAM: CHEST - 2 VIEW  COMPARISON:  05/14/2023 FINDINGS: The cardiomediastinal contours are normal. The lungs are clear. Pulmonary vasculature is normal. No consolidation, pleural effusion, or pneumothorax. No acute osseous abnormalities are seen. IMPRESSION: No active cardiopulmonary disease. Electronically Signed   By: Narda Rutherford M.D.   On: 06/25/2023 21:39      Dione Booze, MD 06/26/23 [id number]

## 2024-05-03 ENCOUNTER — Other Ambulatory Visit: Payer: Self-pay

## 2024-05-03 ENCOUNTER — Emergency Department (HOSPITAL_BASED_OUTPATIENT_CLINIC_OR_DEPARTMENT_OTHER): Payer: MEDICAID

## 2024-05-03 ENCOUNTER — Emergency Department (HOSPITAL_BASED_OUTPATIENT_CLINIC_OR_DEPARTMENT_OTHER)
Admission: EM | Admit: 2024-05-03 | Discharge: 2024-05-03 | Disposition: A | Discharge status: Home / Self Care | Payer: MEDICAID | Attending: Emergency Medicine | Admitting: Emergency Medicine

## 2024-05-03 ENCOUNTER — Encounter (HOSPITAL_BASED_OUTPATIENT_CLINIC_OR_DEPARTMENT_OTHER): Payer: Self-pay

## 2024-05-03 DIAGNOSIS — R0789 Other chest pain: Secondary | ICD-10-CM | POA: Insufficient documentation

## 2024-05-03 DIAGNOSIS — J45909 Unspecified asthma, uncomplicated: Secondary | ICD-10-CM | POA: Insufficient documentation

## 2024-05-03 DIAGNOSIS — I110 Hypertensive heart disease with heart failure: Secondary | ICD-10-CM | POA: Insufficient documentation

## 2024-05-03 DIAGNOSIS — I509 Heart failure, unspecified: Secondary | ICD-10-CM | POA: Insufficient documentation

## 2024-05-03 LAB — CBC
HCT: 35.9 % — ABNORMAL LOW (ref 36.0–46.0)
Hemoglobin: 12.1 g/dL (ref 12.0–15.0)
MCH: 33.5 pg (ref 26.0–34.0)
MCHC: 33.7 g/dL (ref 30.0–36.0)
MCV: 99.4 fL (ref 80.0–100.0)
Platelets: 307 10*3/uL (ref 150–400)
RBC: 3.61 MIL/uL — ABNORMAL LOW (ref 3.87–5.11)
RDW: 13.8 % (ref 11.5–15.5)
WBC: 5.5 10*3/uL (ref 4.0–10.5)
nRBC: 0 % (ref 0.0–0.2)

## 2024-05-03 LAB — BASIC METABOLIC PANEL WITH GFR
Anion gap: 9 (ref 5–15)
BUN: 15 mg/dL (ref 6–20)
CO2: 28 mmol/L (ref 22–32)
Calcium: 9.4 mg/dL (ref 8.9–10.3)
Chloride: 103 mmol/L (ref 98–111)
Creatinine, Ser: 1.02 mg/dL — ABNORMAL HIGH (ref 0.44–1.00)
GFR, Estimated: >60 mL/min
Glucose, Bld: 91 mg/dL (ref 70–99)
Potassium: 4 mmol/L (ref 3.5–5.1)
Sodium: 140 mmol/L (ref 135–145)

## 2024-05-03 LAB — PRO BRAIN NATRIURETIC PEPTIDE: Pro Brain Natriuretic Peptide: 145 pg/mL

## 2024-05-03 LAB — TROPONIN T, HIGH SENSITIVITY: Troponin T High Sensitivity: 9 ng/L (ref 0–19)

## 2024-05-03 MED ORDER — AMLODIPINE BESYLATE 5 MG PO TABS
5.0000 mg | ORAL_TABLET | Freq: Once | ORAL | Status: AC
  Administered 2024-05-03: 5 mg via ORAL
  Filled 2024-05-03: qty 1

## 2024-05-03 NOTE — ED Notes (Signed)
 Asked by RN to look for a vein for labs and possible CTA. Introduced myself to patient and explained procedure to patient . At the point in procedure to cannulate vein patient asked that I remove needle due to pain. I removed needle and applied dressing, notified RN and provider. Patient tolerated mildly.

## 2024-05-03 NOTE — ED Notes (Signed)
Provider notified of elevated blood pressure.

## 2024-05-03 NOTE — ED Provider Notes (Signed)
 " [facility] Provider Note   CSN: [id number] Arrival date & time: 05/03/24  1919     Patient presents with: Chest Pain   Valerie Haney is a 54 y.o. female with past medical history of asthma, CHF, hypertension, prior stroke, seizure disorder, reflux, hyperlipidemia, who presents emergency department for evaluation of chest pain and shortness of breath for the last 2 days.  Patient reports that she has a sharp pain in the center of her chest, that occasionally goes into her right arm.  She also reports associated shortness of breath with these episodes.  She states she has been taking aspirin  at home, without relief.   Chest Pain      Prior to Admission medications  Medication Sig Start Date End Date Taking? Authorizing Provider  albuterol  (PROVENTIL ) (2.5 MG/3ML) 0.083% nebulizer solution Take 2.5 mg by nebulization every 3 (three) hours as needed for wheezing or shortness of breath.    [provider]  albuterol  (VENTOLIN  HFA) 108 (90 Base) MCG/ACT inhaler Inhale 2 puffs into the lungs See admin instructions. Inhale 2 puffs into the lungs every 4-6 hours    [provider]  ALPRAZolam  (XANAX ) 1 MG tablet Take 1 mg by mouth at bedtime. 11/04/20   [provider]  amLODipine  (NORVASC ) 10 MG tablet Take 1 tablet (10 mg total) by mouth daily. 04/28/23 04/27/24  Odell Celinda Balo, MD  atorvastatin  (LIPITOR) 40 MG tablet Take 40 mg by mouth daily. 05/31/16   [provider]  budesonide -formoterol  (SYMBICORT ) 160-4.5 MCG/ACT inhaler Inhale 2 puffs into the lungs in the morning and at bedtime. 10/29/21   Gonfa, Taye T, MD  LICART 1.3 % PT24 Apply 1 patch topically daily as needed (for pain).    [provider]  lisinopril-hydrochlorothiazide  (ZESTORETIC) 20-25 MG tablet Take 1 tablet by mouth daily.    [provider]  NARCAN 4 MG/0.1ML LIQD nasal spray kit Place 1 spray into the nose once as needed  (as needed/as directed).    [provider]  NOREL AD 4-10-325 MG TABS Take 1 tablet by mouth at bedtime.    [provider]  oxyCODONE -acetaminophen  (PERCOCET) 10-325 MG tablet Take 1 tablet by mouth in the morning, at noon, in the evening, and at bedtime.    [provider]  propranolol  ER (INDERAL  LA) 60 MG 24 hr capsule Take 1 capsule (60 mg total) by mouth daily. Patient not taking: Reported on 04/27/2023 11/26/21 04/27/23  Camara, Amadou, MD  Ubrogepant (UBRELVY) 100 MG TABS Take 100 mg by mouth as needed (for migraine and may repeat ONCE in 2 hours, if no relief- max dosage of 200 mg/24 hours). 10/07/20   [provider]  Vitamin D, Ergocalciferol, (DRISDOL) 1.25 MG (50000 UNIT) CAPS capsule Take 50,000 Units by mouth every Wednesday.    [provider]    Allergies: Lisinopril    Review of Systems  Cardiovascular:  Positive for chest pain.    Updated Vital Signs BP (!) 191/112 (BP Location: Right Arm)   Pulse 74   Temp 98.7 F (37.1 C) (Oral)   Resp 17   SpO2 100%   Physical Exam Vitals and nursing note reviewed.  Constitutional:      Appearance: Normal appearance.  HENT:     Head: Normocephalic and atraumatic.     Mouth/Throat:     Mouth: Mucous membranes are moist.  Eyes:     General: No scleral icterus.  Right eye: No discharge.        Left eye: No discharge.     Conjunctiva/sclera: Conjunctivae normal.  Cardiovascular:     Rate and Rhythm: Normal rate and regular rhythm.     Pulses: Normal pulses.     Comments: Chest pain reproducible to palpation Pulmonary:     Effort: Pulmonary effort is normal.     Breath sounds: Normal breath sounds.  Abdominal:     General: There is no distension.     Tenderness: There is no abdominal tenderness.  Musculoskeletal:        General: No deformity.     Cervical back: Normal range of motion.     Right lower leg: No edema.     Left lower leg: No edema.  Skin:    General: Skin  is warm and dry.     Capillary Refill: Capillary refill takes less than 2 seconds.  Neurological:     Mental Status: She is alert.     Motor: No weakness.  Psychiatric:        Mood and Affect: Mood normal.     (all labs ordered are listed, but only abnormal results are displayed) Labs Reviewed  BASIC METABOLIC PANEL WITH GFR - Abnormal; Notable for the following components:      Result Value   Creatinine, Ser 1.02 (*)    All other components within normal limits  CBC - Abnormal; Notable for the following components:   RBC 3.61 (*)    HCT 35.9 (*)    All other components within normal limits  PRO BRAIN NATRIURETIC PEPTIDE  TROPONIN T, HIGH SENSITIVITY  TROPONIN T, HIGH SENSITIVITY    EKG: None  Radiology: DG Chest 2 View Result Date: 05/03/2024 EXAM: 2 VIEW(S) XRAY OF THE CHEST 05/03/2024 09:50:00 PM COMPARISON: Chest x-ray dated 06/25/2023. CLINICAL HISTORY: Chest pain. FINDINGS: LUNGS AND PLEURA: No focal pulmonary opacity. No pleural effusion. No pneumothorax. HEART AND MEDIASTINUM: No acute abnormality of the cardiac and mediastinal silhouettes. BONES AND SOFT TISSUES: No acute osseous abnormality. IMPRESSION: 1. No acute cardiopulmonary abnormality. Electronically signed by: Greig Pique MD 05/03/2024 09:55 PM EST RP Workstation: [id number]     Procedures   Medications Ordered in the ED  amLODipine  (NORVASC ) tablet 5 mg (5 mg Oral Given 05/03/24 2157)                                 Medical Decision Making Amount and/or Complexity of Data Reviewed Labs: ordered. Radiology: ordered.  Risk Prescription drug management.   This patient presents to the ED for concern of chest pain and shortness of breath, this involves an extensive number of treatment options, and is a complaint that carries with it a high risk of complications and morbidity.  Differential diagnosis includes: ACS, STEMI, NSTEMI, pneumothorax, cardiac tamponade, aortic dissection, GERD, costochondritis,  PE, pneumonia, flu, asthma exacerbation, heart failure exacerbation, COPD exacerbation, hypertensive urgency  Co morbidities:  see above   Lab Tests:  I Ordered, and personally interpreted labs.  The pertinent results include: No acute abnormalities  Imaging Studies:  I ordered imaging studies including chest x-ray I independently visualized and interpreted imaging which showed no acute cardiopulmonary abnormality I agree with the radiologist interpretation  Cardiac Monitoring/ECG:  The patient was maintained on a cardiac monitor.  I personally viewed and interpreted the cardiac monitored which showed an underlying rhythm of: Sinus rhythm  Medicines ordered and prescription  drug management:  I ordered medication including  Medications  amLODipine  (NORVASC ) tablet 5 mg (5 mg Oral Given 05/03/24 2157)   for hypertension Reevaluation of the patient after these medicines showed that the patient improved I have reviewed the patients home medicines and have made adjustments as needed  Test Considered:   none  Critical Interventions:   none  Consultations Obtained: None  Problem List / ED Course:     ICD-10-CM   1. Chest wall pain  R07.89       MDM: 54 year old female who presents emergency department for evaluation of chest pain and shortness of breath x 2 days.  Lab work shows no acute abnormalities or evidence of infection to correlate to patient's symptoms.  Troponin is 9.  Given that patient symptoms have been ongoing for the last 2 days, delta troponin not indicated.  Chest x-ray shows no acute cardiopulmonary abnormality to suggest infection or pneumothorax.  Patient's chest pain is reproducible to palpation, making it more likely to be musculoskeletal in nature.  Upon reassessment, patient reports that her pain has improved.  I did recommend that the patient follow-up with her PCP for further evaluation.  Ultimately, patient does not show any signs of demand ischemia  or concerning features that would indicate a further workup and hospital admission.  I did give her amlodipine  for her blood pressure given that it was almost 200 systolic.  Otherwise, patient's vitals are stable.  Patient is appropriate for discharge at this time.   Dispostion:  After consideration of the diagnostic results and the patients response to treatment, I feel that the patient would benefit from supportive care.   Final diagnoses:  Chest wall pain    ED Discharge Orders     None          Torrence Marry GORMAN DEVONNA 05/03/24 2316    Rogelia Jerilynn GORMAN, MD 05/03/24 [phone]  "

## 2024-05-03 NOTE — Discharge Instructions (Signed)
 It was a pleasure taking care of you today. You were seen in the Emergency Department for evaluation of chest pain and shortness of breath. Your work-up was reassuring. Your CT/Xray/Labs showed no acute cardiopulmonary abnormalities or evidence of infection that would explain your symptoms. Refer to the attached documentation for further management of your symptoms. Follow up with your PCP if your symptoms continue.  Please return to the ER if you experience chest pain, trouble breathing, intractable nausea/vomiting or any other life threatening illnesses.

## 2024-05-03 NOTE — ED Triage Notes (Signed)
 Pt is coming in for chest pain and shortness of breath, she mentions it has been going on for around 2 days, she has been taking aspirin  at home in hopes of it going away. She has been having some shortness of breath as well.
